# Patient Record
Sex: Male | Born: 1955 | Race: Black or African American | Hispanic: No | Marital: Married | Smoking: Former smoker
Health system: Southern US, Community
[De-identification: ages and names within clinical notes are randomized; demographics above are authoritative.]

## PROBLEM LIST (undated history)

## (undated) DIAGNOSIS — E669 Obesity, unspecified: Secondary | ICD-10-CM

## (undated) DIAGNOSIS — E119 Type 2 diabetes mellitus without complications: Secondary | ICD-10-CM

## (undated) DIAGNOSIS — E785 Hyperlipidemia, unspecified: Secondary | ICD-10-CM

## (undated) DIAGNOSIS — I10 Essential (primary) hypertension: Secondary | ICD-10-CM

## (undated) DIAGNOSIS — T7840XA Allergy, unspecified, initial encounter: Secondary | ICD-10-CM

## (undated) HISTORY — DX: Hyperlipidemia, unspecified: E78.5

## (undated) HISTORY — PX: ABDOMINAL SURGERY: SHX537

## (undated) HISTORY — DX: Allergy, unspecified, initial encounter: T78.40XA

## (undated) HISTORY — DX: Obesity, unspecified: E66.9

## (undated) HISTORY — DX: Essential (primary) hypertension: I10

---

## 2004-07-28 ENCOUNTER — Emergency Department: Payer: Self-pay | Admitting: Internal Medicine

## 2009-09-30 ENCOUNTER — Emergency Department: Payer: Self-pay | Admitting: Emergency Medicine

## 2014-02-11 ENCOUNTER — Ambulatory Visit: Payer: Self-pay | Admitting: Family Medicine

## 2014-08-26 ENCOUNTER — Encounter: Payer: Self-pay | Admitting: Family Medicine

## 2014-08-26 ENCOUNTER — Ambulatory Visit (INDEPENDENT_AMBULATORY_CARE_PROVIDER_SITE_OTHER): Payer: BLUE CROSS/BLUE SHIELD | Admitting: Family Medicine

## 2014-08-26 VITALS — BP 118/80 | HR 82 | Temp 98.2°F | Resp 16 | Ht 68.5 in | Wt 220.0 lb

## 2014-08-26 DIAGNOSIS — S39011A Strain of muscle, fascia and tendon of abdomen, initial encounter: Secondary | ICD-10-CM

## 2014-08-26 DIAGNOSIS — E119 Type 2 diabetes mellitus without complications: Secondary | ICD-10-CM | POA: Insufficient documentation

## 2014-08-26 DIAGNOSIS — E669 Obesity, unspecified: Secondary | ICD-10-CM | POA: Insufficient documentation

## 2014-08-26 DIAGNOSIS — E785 Hyperlipidemia, unspecified: Secondary | ICD-10-CM | POA: Insufficient documentation

## 2014-08-26 DIAGNOSIS — R7303 Prediabetes: Secondary | ICD-10-CM | POA: Insufficient documentation

## 2014-08-26 DIAGNOSIS — S76219A Strain of adductor muscle, fascia and tendon of unspecified thigh, initial encounter: Secondary | ICD-10-CM

## 2014-08-26 DIAGNOSIS — B070 Plantar wart: Secondary | ICD-10-CM | POA: Insufficient documentation

## 2014-08-26 DIAGNOSIS — E782 Mixed hyperlipidemia: Secondary | ICD-10-CM | POA: Insufficient documentation

## 2014-08-26 DIAGNOSIS — E668 Other obesity: Secondary | ICD-10-CM | POA: Insufficient documentation

## 2014-08-26 DIAGNOSIS — I10 Essential (primary) hypertension: Secondary | ICD-10-CM | POA: Insufficient documentation

## 2014-08-26 DIAGNOSIS — E1169 Type 2 diabetes mellitus with other specified complication: Secondary | ICD-10-CM | POA: Insufficient documentation

## 2014-08-26 DIAGNOSIS — R768 Other specified abnormal immunological findings in serum: Secondary | ICD-10-CM | POA: Insufficient documentation

## 2014-08-26 DIAGNOSIS — M25569 Pain in unspecified knee: Secondary | ICD-10-CM | POA: Insufficient documentation

## 2014-08-26 DIAGNOSIS — R748 Abnormal levels of other serum enzymes: Secondary | ICD-10-CM | POA: Insufficient documentation

## 2014-08-26 MED ORDER — OXYCODONE-ACETAMINOPHEN 5-325 MG PO TABS
1.0000 | ORAL_TABLET | Freq: Four times a day (QID) | ORAL | Status: DC | PRN
Start: 2014-08-26 — End: 2015-02-24

## 2014-08-26 MED ORDER — CYCLOBENZAPRINE HCL 5 MG PO TABS: 5.0000 mg | ORAL_TABLET | Freq: Every day | ORAL | Status: DC

## 2014-08-26 NOTE — Patient Instructions (Signed)
Groin Strain °A groin strain (also called a groin pull) is an injury to the muscles or tendon on the upper inner part of the thigh. These muscles are called the adductor muscles or groin muscles. They are responsible for moving the leg across the body. A muscle strain occurs when a muscle is overstretched and some muscle fibers are torn. A groin strain can range from mild to severe depending on how many muscle fibers are affected and whether the muscle fibers are partially or completely torn.  °Groin strains usually occur during exercise or participation in sports. The injury often happens when a sudden, violent force is placed on a muscle, stretching the muscle too far. A strain is more likely to occur when your muscles are not warmed up or if you are not properly conditioned. Depending on the severity of the groin strain, recovery time may vary from a few weeks to several weeks. Severe injuries often require 4-6 weeks for recovery. In these cases, complete healing can take 4-5 months.  °CAUSES  °· Stretching the groin muscles too far or too suddenly, often during side-to-side motion with an abrupt change in direction. °· Putting repeated stress on the groin muscles over a long period of time. °· Performing vigorous activity without properly stretching the groin muscles beforehand. °SYMPTOMS  °· Pain and tenderness in the groin area. This begins as sharp pain and persists as a dull ache. °· Popping or snapping feeling when the injury occurs (for severe strains). °· Swelling or bruising. °· Muscle spasms. °· Weakness in the leg. °· Stiffness in the groin area with decreased ability to move the affected muscles. °DIAGNOSIS  °Your caregiver will perform a physical exam to diagnose a groin strain. You will be asked about your symptoms and how the injury occurred. X-rays are sometimes needed to rule out a broken bone or cartilage problems. Your caregiver may order a CT scan or MRI if a complete muscle tear is  suspected. °TREATMENT  °A groin strain will often heal on its own. Your caregiver may prescribe medicines to help manage pain and swelling (anti-inflammatory medicine). You may be told to use crutches for the first few days to minimize your pain. °HOME CARE INSTRUCTIONS  °· Rest. Do not use the strained muscle if it causes pain. °· Put ice on the injured area. °¨ Put ice in a plastic bag. °¨ Place a towel between your skin and the bag. °¨ Leave the ice on for 15-20 minutes, every 2-3 hours. Do this for the first 2 days after the injury.  °· Only take over-the-counter or prescription medicines as directed by your caregiver. °· Wrap the injured area with an elastic bandage as directed by your caregiver. °· Keep the injured leg raised (elevated). °· Walk, stretch, and perform range-of-motion exercises to improve blood flow to the injured area. Only perform these activities if you can do so without any pain. °To prevent muscle strains: °· Warm up before exercise. °· Develop proper conditioning and strength in the groin muscles. °SEEK IMMEDIATE MEDICAL CARE IF:  °· You have increased pain or swelling in the affected area.   °· Your symptoms are not improving or are getting worse. °MAKE SURE YOU:  °· Understand these instructions. °· Will watch your condition. °· Will get help right away if you are not doing well or get worse. °Document Released: 11/09/2003 Document Revised: 02/28/2012 Document Reviewed: 11/15/2011 °[employer]® Patient Information ©2015 [employer]. This information is not intended to replace advice given to you   by your health care provider. Make sure you discuss any questions you have with your health care provider. ° °

## 2014-08-26 NOTE — Progress Notes (Signed)
Name: Paul Ingram   MRN: [id number]    DOB: [birth year]   Date:08/26/2014       Progress Note  Subjective  Chief Complaint  Chief Complaint  Patient presents with  . Leg Pain    Right hip area w/o known injury.    HPI  The patient is here today to discuss their joint pain.  Is this a chronic problem or an acute problem? Acute When did the pain start? Yesterday Where is the pain located? Right hip How would you describe the pain? (constant, intermittent, sharp, piercing, throbbing, dull ache) Constant Does the pain radiate beyond place of origin? No During a typical day what is the pattern of the pain? constant What makes the pain better? Sitting What makes the pain worse? General activity Is patient able to function at home, work, school? Went to work yesterday. Is it getting better, worse or staying the same? Staying about the same What medications have you tried to help manage your pain?  Ibuprofen  Which medications work reasonably well to help manage your pain? Ibuprofen did not help much. Have you have X-rays, MRI, CT Scan related to the area of pain? No Have you ever participated in Physical Therapy for this pain? no Have you ever been evaluated by an Orthopedic or Pain Specialist Doctor? no If yes please list name of doctor or clinic:   Patient Active Problem List   Diagnosis Date Noted  . Abnormal liver enzymes 08/26/2014  . 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 Elevated cholesterol with elevated triglycerides 08/26/2014  . BP (high blood pressure) 08/26/2014  . Gonalgia 08/26/2014  . Extreme obesity 08/26/2014  . Plantar verruca 08/26/2014  . Borderline diabetes 08/26/2014  . Groin strain 08/26/2014    History  Substance Use Topics  . Smoking status: Current Some Day Smoker  . Smokeless tobacco: Not on file     Comment: patient stated he doesn't smoke enough to quit  . Alcohol Use: 0.0 oz/week    0 Standard drinks or equivalent per week   Comment: during special events and games     Current outpatient prescriptions:  .  ibuprofen (ADVIL,MOTRIN) 800 MG tablet, Take by mouth., Disp: , Rfl:  .  lisinopril-hydrochlorothiazide (PRINZIDE,ZESTORETIC) 10-12.5 MG per tablet, Take by mouth., Disp: , Rfl:  .  cyclobenzaprine (FLEXERIL) 5 MG tablet, Take 1 tablet (5 mg total) by mouth at bedtime., Disp: 30 tablet, Rfl: 1 .  oxyCODONE-acetaminophen (ROXICET) 5-325 MG per tablet, Take 1 tablet by mouth every 6 (six) hours as needed for severe pain., Disp: 30 tablet, Rfl: 0  Allergies  Allergen Reactions  . Shellfish Allergy Anaphylaxis    Review of Systems  Ten systems reviewed and is negative except as mentioned in HPI. No fever, chills, numbness.   Objective  BP 118/80 mmHg  Pulse 82  Temp(Src) 98.2 F (36.8 C) (Oral)  Resp 16  Ht 5' 8.5" (1.74 m)  Wt 220 lb (99.791 kg)  BMI 32.96 kg/m2  SpO2 98%  Physical Exam  Constitutional: Patient appears well-developed and well-nourished. In no distress.  Cardiovascular: Normal rate, regular rhythm and normal heart sounds.  No murmur heard.  Pulmonary/Chest: Effort normal and breath sounds normal. No respiratory distress. Musculoskeletal: Normal range of motion bilateral UE and LE, no joint effusions. No leg length discrepancy after resetting hips bilaterally. Right hip flexion, extension, external rotation and internal rotation uncomfortable for patient during passive motion but ROM intact.  Peripheral vascular: Bilateral LE no edema. Neurological:  CN II-XII grossly intact with no focal deficits. Alert and oriented to person, place, and time. Coordination, balance, strength, speech and gait are normal.  Skin: Skin is warm and dry. No rash noted. No erythema.  Psychiatric: Patient has a normal mood and affect. Behavior is normal in office today. Judgment and thought content normal in office today.    Assessment & Plan  1. Groin strain, initial encounter Likely soft tissue  strain of right groin. Instructed on exercises and conservative management at home. F/U if no improvement, will get imaging at that time if appropriate.   - oxyCODONE-acetaminophen (ROXICET) 5-325 MG per tablet; Take 1 tablet by mouth every 6 (six) hours as needed for severe pain.  Dispense: 30 tablet; Refill: 0 - cyclobenzaprine (FLEXERIL) 5 MG tablet; Take 1 tablet (5 mg total) by mouth at bedtime.  Dispense: 30 tablet; Refill: 1

## 2014-10-28 ENCOUNTER — Other Ambulatory Visit: Payer: Self-pay | Admitting: Family Medicine

## 2015-02-15 ENCOUNTER — Encounter: Payer: Self-pay | Admitting: Family Medicine

## 2015-02-24 ENCOUNTER — Encounter: Payer: Self-pay | Admitting: Family Medicine

## 2015-02-24 ENCOUNTER — Ambulatory Visit (INDEPENDENT_AMBULATORY_CARE_PROVIDER_SITE_OTHER): Payer: BLUE CROSS/BLUE SHIELD | Admitting: Family Medicine

## 2015-02-24 VITALS — BP 132/86 | HR 78 | Temp 98.2°F | Resp 12 | Wt 216.5 lb

## 2015-02-24 DIAGNOSIS — Z23 Encounter for immunization: Secondary | ICD-10-CM | POA: Insufficient documentation

## 2015-02-24 DIAGNOSIS — L309 Dermatitis, unspecified: Secondary | ICD-10-CM | POA: Diagnosis not present

## 2015-02-24 DIAGNOSIS — R894 Abnormal immunological findings in specimens from other organs, systems and tissues: Secondary | ICD-10-CM | POA: Diagnosis not present

## 2015-02-24 DIAGNOSIS — E782 Mixed hyperlipidemia: Secondary | ICD-10-CM

## 2015-02-24 DIAGNOSIS — Z0000 Encounter for general adult medical examination without abnormal findings: Secondary | ICD-10-CM | POA: Diagnosis not present

## 2015-02-24 DIAGNOSIS — R7303 Prediabetes: Secondary | ICD-10-CM

## 2015-02-24 DIAGNOSIS — I10 Essential (primary) hypertension: Secondary | ICD-10-CM

## 2015-02-24 DIAGNOSIS — Z125 Encounter for screening for malignant neoplasm of prostate: Secondary | ICD-10-CM | POA: Insufficient documentation

## 2015-02-24 DIAGNOSIS — R768 Other specified abnormal immunological findings in serum: Secondary | ICD-10-CM

## 2015-02-24 MED ORDER — HYDROCORTISONE VALERATE 0.2 % EX CREA: 1.0000 "application " | TOPICAL_CREAM | Freq: Two times a day (BID) | CUTANEOUS | Status: DC

## 2015-02-24 MED ORDER — LISINOPRIL-HYDROCHLOROTHIAZIDE 10-12.5 MG PO TABS: 1.0000 | ORAL_TABLET | Freq: Every day | ORAL | Status: DC
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
## 2015-04-28 ENCOUNTER — Ambulatory Visit
Admission: RE | Admit: 2015-04-28 | Discharge: 2015-04-28 | Disposition: A | Discharge status: Home / Self Care | Payer: BLUE CROSS/BLUE SHIELD | Source: Ambulatory Visit | Attending: Family Medicine | Admitting: Family Medicine

## 2015-04-28 ENCOUNTER — Encounter: Payer: Self-pay | Admitting: Family Medicine

## 2015-04-28 ENCOUNTER — Ambulatory Visit (INDEPENDENT_AMBULATORY_CARE_PROVIDER_SITE_OTHER): Payer: BLUE CROSS/BLUE SHIELD | Admitting: Family Medicine

## 2015-04-28 VITALS — BP 122/84 | HR 84 | Temp 99.0°F | Resp 16 | Ht 69.0 in | Wt 218.1 lb

## 2015-04-28 DIAGNOSIS — K5900 Constipation, unspecified: Secondary | ICD-10-CM | POA: Insufficient documentation

## 2015-04-28 DIAGNOSIS — R109 Unspecified abdominal pain: Secondary | ICD-10-CM | POA: Insufficient documentation

## 2015-04-28 DIAGNOSIS — K625 Hemorrhage of anus and rectum: Secondary | ICD-10-CM | POA: Diagnosis not present

## 2015-04-28 DIAGNOSIS — K921 Melena: Secondary | ICD-10-CM | POA: Diagnosis not present

## 2015-04-28 DIAGNOSIS — Z1211 Encounter for screening for malignant neoplasm of colon: Secondary | ICD-10-CM | POA: Insufficient documentation

## 2015-04-28 LAB — POC HEMOCCULT BLD/STL (OFFICE/1-CARD/DIAGNOSTIC): Fecal Occult Blood, POC: NEGATIVE

## 2015-04-28 MED ORDER — HYDROCORTISONE ACETATE 25 MG RE SUPP: 25.0000 mg | Freq: Two times a day (BID) | RECTAL | Status: DC | PRN
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
## 2015-04-28 NOTE — Patient Instructions (Signed)
Start miralax every day, adjust dose to achieve soft bowel movement every day or every other day.

## 2015-05-09 ENCOUNTER — Telehealth: Payer: Self-pay | Admitting: Gastroenterology

## 2015-05-09 NOTE — Telephone Encounter (Signed)
Gastroenterology Pre-Procedure Review  Request Date: 06-15-2015 Requesting Physician: Dr.  PATIENT REVIEW QUESTIONS: The patient responded to the following health history questions as indicated:    1. Are you having any GI issues? no Sometimes patient has rectal bleeding but ok right now 2. Do you have a personal history of Polyps? no 3. Do you have a family history of Colon Cancer or Polyps? no 4. Diabetes Mellitus? no 5. Joint replacements in the past 12 months?no  6. Major health problems in the past 3 months?no 7. Any artificial heart valves, MVP, or defibrillator?no    MEDICATIONS & ALLERGIES:    Patient reports the following regarding taking any anticoagulation/antiplatelet therapy:   Plavix, Coumadin, Eliquis, Xarelto, Lovenox, Pradaxa, Brilinta, or Effient? no Aspirin? no  Patient confirms/reports the following medications:  Current Outpatient Prescriptions  Medication Sig Dispense Refill   hydrocortisone (ANUSOL-HC) 25 MG suppository Place 1 suppository (25 mg total) rectally 2 (two) times daily as needed for hemorrhoids or itching. 12 suppository 1   hydrocortisone valerate cream (WESTCORT) 0.2 % Apply 1 application topically 2 (two) times daily. 60 g 1   lisinopril-hydrochlorothiazide (PRINZIDE,ZESTORETIC) 10-12.5 MG tablet Take 1 tablet by mouth daily. 90 tablet 2   No current facility-administered medications for this visit.    Patient confirms/reports the following allergies:  Allergies  Allergen Reactions   Shellfish Allergy Anaphylaxis    No orders of the defined types were placed in this encounter.    AUTHORIZATION INFORMATION Primary Insurance: 1D#: Group #:  Secondary Insurance: 1D#: Group #:  SCHEDULE INFORMATION: Date: 06-15-2015 Time: Location:[facility]

## 2015-05-10 ENCOUNTER — Other Ambulatory Visit: Payer: Self-pay

## 2015-06-15 ENCOUNTER — Encounter: Payer: Self-pay | Admitting: *Deleted

## 2015-06-15 ENCOUNTER — Encounter
Admission: RE | Disposition: A | Discharge status: Home / Self Care | Payer: Self-pay | Source: Ambulatory Visit | Attending: Gastroenterology

## 2015-06-15 ENCOUNTER — Ambulatory Visit: Payer: BLUE CROSS/BLUE SHIELD | Admitting: Anesthesiology

## 2015-06-15 ENCOUNTER — Ambulatory Visit
Admission: RE | Admit: 2015-06-15 | Discharge: 2015-06-15 | Disposition: A | Discharge status: Home / Self Care | Payer: BLUE CROSS/BLUE SHIELD | Source: Ambulatory Visit | Attending: Gastroenterology | Admitting: Gastroenterology

## 2015-06-15 DIAGNOSIS — I10 Essential (primary) hypertension: Secondary | ICD-10-CM | POA: Insufficient documentation

## 2015-06-15 DIAGNOSIS — Z79899 Other long term (current) drug therapy: Secondary | ICD-10-CM | POA: Insufficient documentation

## 2015-06-15 DIAGNOSIS — Z1211 Encounter for screening for malignant neoplasm of colon: Secondary | ICD-10-CM | POA: Diagnosis not present

## 2015-06-15 DIAGNOSIS — Z809 Family history of malignant neoplasm, unspecified: Secondary | ICD-10-CM | POA: Diagnosis not present

## 2015-06-15 DIAGNOSIS — K640 First degree hemorrhoids: Secondary | ICD-10-CM | POA: Insufficient documentation

## 2015-06-15 DIAGNOSIS — D125 Benign neoplasm of sigmoid colon: Secondary | ICD-10-CM | POA: Diagnosis not present

## 2015-06-15 DIAGNOSIS — Z8042 Family history of malignant neoplasm of prostate: Secondary | ICD-10-CM | POA: Diagnosis not present

## 2015-06-15 DIAGNOSIS — D123 Benign neoplasm of transverse colon: Secondary | ICD-10-CM | POA: Insufficient documentation

## 2015-06-15 DIAGNOSIS — Z91013 Allergy to seafood: Secondary | ICD-10-CM | POA: Insufficient documentation

## 2015-06-15 DIAGNOSIS — K5730 Diverticulosis of large intestine without perforation or abscess without bleeding: Secondary | ICD-10-CM | POA: Insufficient documentation

## 2015-06-15 DIAGNOSIS — E785 Hyperlipidemia, unspecified: Secondary | ICD-10-CM | POA: Insufficient documentation

## 2015-06-15 DIAGNOSIS — F17210 Nicotine dependence, cigarettes, uncomplicated: Secondary | ICD-10-CM | POA: Diagnosis not present

## 2015-06-15 HISTORY — PX: COLONOSCOPY WITH PROPOFOL: SHX5780

## 2015-06-15 SURGERY — COLONOSCOPY WITH PROPOFOL
Anesthesia: General

## 2015-06-15 MED ORDER — PROPOFOL 500 MG/50ML IV EMUL
INTRAVENOUS | Status: DC | PRN
  Administered 2015-06-15: 50 ug/kg/min via INTRAVENOUS

## 2015-06-15 MED ORDER — SODIUM CHLORIDE 0.9 % IV SOLN
INTRAVENOUS | Status: DC
  Administered 2015-06-15 (×2): via INTRAVENOUS
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
## 2015-06-15 NOTE — Transfer of Care (Signed)
Immediate Anesthesia Transfer of Care Note  Patient: Paul Ingram  Procedure(s) Performed: Procedure(s): COLONOSCOPY WITH PROPOFOL (N/A)  Patient Location: PACU  Anesthesia Type:General  Level of Consciousness: awake, alert  and oriented  Airway & Oxygen Therapy: Patient Spontanous Breathing and Patient connected to nasal cannula oxygen  Post-op Assessment: Report given to RN and Post -op Vital signs reviewed and stable  Post vital signs: Reviewed and stable  Last Vitals:  Filed Vitals:   06/15/15 0926 06/15/15 1115  BP: 119/83 109/76  Pulse: 76 72  Temp: 37.1 C   Resp: 18 12    Complications: No apparent anesthesia complications
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
## 2015-06-15 NOTE — Op Note (Signed)
[facility] Patient Name: Paul Ingram Procedure Date: 06/15/2015 10:46 AM MRN: [id number] Account #: [id number] Date of Birth: December 12, 1955 Admit Type: Outpatient Age: 60 Room: [facility] ENDO ROOM 4 Gender: Male Note Status: Finalized Procedure:            Colonoscopy Indications:          Screening for colorectal malignant neoplasm Providers:            Lucilla Lame, MD Referring MD:         Bobetta Lime (Referring MD) Medicines:            Propofol per Anesthesia Complications:        No immediate complications. Procedure:            Pre-Anesthesia Assessment:                       - Prior to the procedure, a History and Physical was                        performed, and patient medications and allergies were                        reviewed. The patient's tolerance of previous                        anesthesia was also reviewed. The risks and benefits of                        the procedure and the sedation options and risks were                        discussed with the patient. All questions were                        answered, and informed consent was obtained. Prior                        Anticoagulants: The patient has taken no previous                        anticoagulant or antiplatelet agents. ASA Grade                        Assessment: II - A patient with mild systemic disease.                        After reviewing the risks and benefits, the patient was                        deemed in satisfactory condition to undergo the                        procedure.                       After obtaining informed consent, the colonoscope was                        passed under direct vision. Throughout the procedure,  the patient's blood pressure, pulse, and oxygen                        saturations were monitored continuously. The                        Colonoscope was introduced through the anus and   advanced to the the cecum, identified by appendiceal                        orifice and ileocecal valve. The colonoscopy was                        performed without difficulty. The patient tolerated the                        procedure well. The quality of the bowel preparation                        was excellent. Findings:      The perianal and digital rectal examinations were normal.      Two sessile polyps were found in the sigmoid colon. The polyps were 3 to       6 mm in size. These polyps were removed with a cold snare. Resection and       retrieval were complete.      Three sessile polyps were found in the transverse colon. The polyps were       4 to 7 mm in size. These polyps were removed with a cold snare.       Resection and retrieval were complete.      Multiple small-mouthed diverticula were found in the sigmoid colon.      Non-bleeding internal hemorrhoids were found during retroflexion. The       hemorrhoids were Grade I (internal hemorrhoids that do not prolapse). Impression:           - Two 3 to 6 mm polyps in the sigmoid colon, removed                        with a cold snare. Resected and retrieved.                       - Three 4 to 7 mm polyps in the transverse colon,                        removed with a cold snare. Resected and retrieved.                       - Diverticulosis in the sigmoid colon.                       - Non-bleeding internal hemorrhoids. Recommendation:       - Repeat colonoscopy in 5 years if polyp adenoma and 10                        years if hyperplastic Procedure Code(s):    --- Professional ---                       [phone], Colonoscopy, flexible; with removal of tumor(s),  polyp(s), or other lesion(s) by snare technique Diagnosis Code(s):    --- Professional ---                       Z12.11, Encounter for screening for malignant neoplasm                        of colon                       D12.5, Benign neoplasm of  sigmoid colon                       D12.3, Benign neoplasm of transverse colon (hepatic                        flexure or splenic flexure) CPT copyright 2016 American Medical Association. All rights reserved. The codes documented in this report are preliminary and upon coder review may  be revised to meet current compliance requirements. Lucilla Lame, MD 06/15/2015 11:10:08 AM This report has been signed electronically. Number of Addenda: 0 Note Initiated On: 06/15/2015 10:46 AM Scope Withdrawal Time: 0 hours 10 minutes 12 seconds  Total Procedure Duration: 0 hours 17 minutes 7 seconds       [facility]

## 2015-06-15 NOTE — Anesthesia Postprocedure Evaluation (Signed)
Anesthesia Post Note  Patient: Paul Ingram  Procedure(s) Performed: Procedure(s) (LRB): COLONOSCOPY WITH PROPOFOL (N/A)  Patient location during evaluation: PACU Anesthesia Type: General Level of consciousness: awake and alert Pain management: pain level controlled Vital Signs Assessment: post-procedure vital signs reviewed and stable Respiratory status: spontaneous breathing, nonlabored ventilation, respiratory function stable and patient connected to nasal cannula oxygen Cardiovascular status: blood pressure returned to baseline and stable Postop Assessment: no signs of nausea or vomiting Anesthetic complications: no    Last Vitals:  Filed Vitals:   06/15/15 0926 06/15/15 1115  BP: 119/83 109/76  Pulse: 76 72  Temp: 37.1 C   Resp: 18 12    Last Pain: There were no vitals filed for this visit.               [name]

## 2015-06-16 ENCOUNTER — Encounter: Payer: Self-pay | Admitting: Gastroenterology

## 2015-06-16 LAB — SURGICAL PATHOLOGY

## 2015-06-17 ENCOUNTER — Encounter: Payer: Self-pay | Admitting: Gastroenterology

## 2015-09-02 ENCOUNTER — Encounter: Payer: Self-pay | Admitting: Family Medicine

## 2015-09-02 ENCOUNTER — Ambulatory Visit (INDEPENDENT_AMBULATORY_CARE_PROVIDER_SITE_OTHER): Payer: BLUE CROSS/BLUE SHIELD | Admitting: Family Medicine

## 2015-09-02 VITALS — BP 126/82 | HR 70 | Temp 98.8°F | Resp 18 | Wt 214.2 lb

## 2015-09-02 DIAGNOSIS — E782 Mixed hyperlipidemia: Secondary | ICD-10-CM

## 2015-09-02 DIAGNOSIS — Z5181 Encounter for therapeutic drug level monitoring: Secondary | ICD-10-CM

## 2015-09-02 DIAGNOSIS — R252 Cramp and spasm: Secondary | ICD-10-CM | POA: Insufficient documentation

## 2015-09-02 DIAGNOSIS — R7303 Prediabetes: Secondary | ICD-10-CM

## 2015-09-02 DIAGNOSIS — E669 Obesity, unspecified: Secondary | ICD-10-CM

## 2015-09-02 DIAGNOSIS — J0100 Acute maxillary sinusitis, unspecified: Secondary | ICD-10-CM | POA: Diagnosis not present

## 2015-09-02 MED ORDER — AMOXICILLIN-POT CLAVULANATE 875-125 MG PO TABS
1.0000 | ORAL_TABLET | Freq: Two times a day (BID) | ORAL | Status: AC
Start: 2015-09-02 — End: 2015-09-12

## 2015-09-02 NOTE — Assessment & Plan Note (Signed)
Check labs 
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
## 2015-09-02 NOTE — Patient Instructions (Addendum)
Please start the antibiotics Try vitamin C (orange juice if not diabetic or vitamin C tablets) and drink green tea to help your immune system during your illness Get plenty of rest and hydration Please do eat yogurt daily or take a probiotic daily for the next month or two We want to replace the healthy germs in the gut If you notice foul, watery diarrhea in the next two months, schedule an appointment RIGHT AWAY We'll get labs today and contact you about the results  Your goal blood pressure is less than 140 mmHg on top. Try to follow the DASH guidelines (DASH stands for Dietary Approaches to Stop Hypertension) Try to limit the sodium in your diet.  Ideally, consume less than 1.5 grams (less than 1,500mg ) per day. Do not add salt when cooking or at the table.  Check the sodium amount on labels when shopping, and choose items lower in sodium when given a choice. Avoid or limit foods that already contain a lot of sodium. Eat a diet rich in fruits and vegetables and whole grains.  DASH Eating Plan DASH stands for "Dietary Approaches to Stop Hypertension." The DASH eating plan is a healthy eating plan that has been shown to reduce high blood pressure (hypertension). Additional health benefits may include reducing the risk of type 2 diabetes mellitus, heart disease, and stroke. The DASH eating plan may also help with weight loss. WHAT DO I NEED TO KNOW ABOUT THE DASH EATING PLAN? For the DASH eating plan, you will follow these general guidelines:  Choose foods with a percent daily value for sodium of less than 5% (as listed on the food label).  Use salt-free seasonings or herbs instead of table salt or sea salt.  Check with your health care provider or pharmacist before using salt substitutes.  Eat lower-sodium products, often labeled as "lower sodium" or "no salt added."  Eat fresh foods.  Eat more vegetables, fruits, and low-fat dairy products.  Choose whole grains. Look for the word  "whole" as the first word in the ingredient list.  Choose fish and skinless chicken or [place] more often than red meat. Limit fish, poultry, and meat to 6 oz (170 g) each day.  Limit sweets, desserts, sugars, and sugary drinks.  Choose heart-healthy fats.  Limit cheese to 1 oz (28 g) per day.  Eat more home-cooked food and less restaurant, buffet, and fast food.  Limit fried foods.  Cook foods using methods other than frying.  Limit canned vegetables. If you do use them, rinse them well to decrease the sodium.  When eating at a restaurant, ask that your food be prepared with less salt, or no salt if possible. WHAT FOODS CAN I EAT? Seek help from a dietitian for individual calorie needs. Grains Whole grain or whole wheat bread. Brown rice. Whole grain or whole wheat pasta. Quinoa, bulgur, and whole grain cereals. Low-sodium cereals. Corn or whole wheat flour tortillas. Whole grain cornbread. Whole grain crackers. Low-sodium crackers. Vegetables Fresh or frozen vegetables (raw, steamed, roasted, or grilled). Low-sodium or reduced-sodium tomato and vegetable juices. Low-sodium or reduced-sodium tomato sauce and paste. Low-sodium or reduced-sodium canned vegetables.  Fruits All fresh, canned (in natural juice), or frozen fruits. Meat and Other Protein Products Ground beef (85% or leaner), grass-fed beef, or beef trimmed of fat. Skinless chicken or [place]. Ground chicken or [place]. Pork trimmed of fat. All fish and seafood. Eggs. Dried beans, peas, or lentils. Unsalted nuts and seeds. Unsalted canned beans. Dairy Low-fat dairy  products, such as skim or 1% milk, 2% or reduced-fat cheeses, low-fat ricotta or cottage cheese, or plain low-fat yogurt. Low-sodium or reduced-sodium cheeses. Fats and Oils Tub margarines without trans fats. Light or reduced-fat mayonnaise and salad dressings (reduced sodium). Avocado. Safflower, olive, or canola oils. Natural peanut or almond  butter. Other Unsalted popcorn and pretzels. The items listed above may not be a complete list of recommended foods or beverages. Contact your dietitian for more options. WHAT FOODS ARE NOT RECOMMENDED? Grains White bread. White pasta. White rice. Refined cornbread. Bagels and croissants. Crackers that contain trans fat. Vegetables Creamed or fried vegetables. Vegetables in a cheese sauce. Regular canned vegetables. Regular canned tomato sauce and paste. Regular tomato and vegetable juices. Fruits Dried fruits. Canned fruit in light or heavy syrup. Fruit juice. Meat and Other Protein Products Fatty cuts of meat. Ribs, chicken wings, bacon, sausage, bologna, salami, chitterlings, fatback, hot dogs, bratwurst, and packaged luncheon meats. Salted nuts and seeds. Canned beans with salt. Dairy Whole or 2% milk, cream, half-and-half, and cream cheese. Whole-fat or sweetened yogurt. Full-fat cheeses or blue cheese. Nondairy creamers and whipped toppings. Processed cheese, cheese spreads, or cheese curds. Condiments Onion and garlic salt, seasoned salt, table salt, and sea salt. Canned and packaged gravies. Worcestershire sauce. Tartar sauce. Barbecue sauce. Teriyaki sauce. Soy sauce, including reduced sodium. Steak sauce. Fish sauce. Oyster sauce. Cocktail sauce. Horseradish. Ketchup and mustard. Meat flavorings and tenderizers. Bouillon cubes. Hot sauce. Tabasco sauce. Marinades. Taco seasonings. Relishes. Fats and Oils Butter, stick margarine, lard, shortening, ghee, and bacon fat. Coconut, palm kernel, or palm oils. Regular salad dressings. Other Pickles and olives. Salted popcorn and pretzels. The items listed above may not be a complete list of foods and beverages to avoid. Contact your dietitian for more information. WHERE CAN I FIND MORE INFORMATION? National Heart, Lung, and Blood Institute: travelstabloid.com   This information is not intended to replace  advice given to you by your health care provider. Make sure you discuss any questions you have with your health care provider.   Document Released: 03/02/2011 Document Revised: 04/03/2014 Document Reviewed: 01/15/2013 Elsevier Interactive Patient Education [employer].

## 2015-09-02 NOTE — Assessment & Plan Note (Addendum)
Check lipids; patient well overdue for labs; health eating, diet low in saturated fats

## 2015-09-19 ENCOUNTER — Encounter: Payer: Self-pay | Admitting: Family Medicine

## 2015-09-19 DIAGNOSIS — E669 Obesity, unspecified: Secondary | ICD-10-CM

## 2015-09-19 DIAGNOSIS — Z6834 Body mass index (BMI) 34.0-34.9, adult: Secondary | ICD-10-CM | POA: Insufficient documentation

## 2015-09-19 DIAGNOSIS — E6609 Other obesity due to excess calories: Secondary | ICD-10-CM | POA: Insufficient documentation

## 2015-09-19 HISTORY — DX: Obesity, unspecified: E66.9

## 2015-09-19 NOTE — Assessment & Plan Note (Signed)
Check labs to monitor glucose and lipids; weight loss encouraged

## 2015-12-23 ENCOUNTER — Encounter: Payer: Self-pay | Admitting: Family Medicine

## 2015-12-23 ENCOUNTER — Ambulatory Visit
Admission: RE | Admit: 2015-12-23 | Discharge: 2015-12-23 | Disposition: A | Discharge status: Home / Self Care | Payer: BLUE CROSS/BLUE SHIELD | Source: Ambulatory Visit | Attending: Family Medicine | Admitting: Family Medicine

## 2015-12-23 ENCOUNTER — Ambulatory Visit (INDEPENDENT_AMBULATORY_CARE_PROVIDER_SITE_OTHER): Payer: BLUE CROSS/BLUE SHIELD | Admitting: Family Medicine

## 2015-12-23 VITALS — BP 122/80 | HR 83 | Temp 98.0°F | Resp 17 | Ht 68.0 in | Wt 222.1 lb

## 2015-12-23 DIAGNOSIS — M25561 Pain in right knee: Secondary | ICD-10-CM | POA: Insufficient documentation

## 2015-12-23 DIAGNOSIS — M11261 Other chondrocalcinosis, right knee: Secondary | ICD-10-CM | POA: Insufficient documentation

## 2015-12-23 LAB — COMPLETE METABOLIC PANEL WITH GFR
ALT: 34 U/L (ref 9–46)
AST: 30 U/L (ref 10–35)
Albumin: 3.9 g/dL (ref 3.6–5.1)
Alkaline Phosphatase: 57 U/L (ref 40–115)
BUN: 10 mg/dL (ref 7–25)
CO2: 26 mmol/L (ref 20–31)
Calcium: 9.4 mg/dL (ref 8.6–10.3)
Chloride: 108 mmol/L (ref 98–110)
Creat: 0.88 mg/dL (ref 0.70–1.25)
GFR, Est African American: >89 mL/min (ref >=60)
GFR, Est Non African American: >89 mL/min (ref >=60)
Glucose, Bld: 97 mg/dL (ref 65–99)
Potassium: 4.2 mmol/L (ref 3.5–5.3)
Sodium: 140 mmol/L (ref 135–146)
Total Bilirubin: 0.7 mg/dL (ref 0.2–1.2)
Total Protein: 6.7 g/dL (ref 6.1–8.1)

## 2015-12-23 MED ORDER — MELOXICAM 7.5 MG PO TABS: 7.5000 mg | ORAL_TABLET | Freq: Every day | ORAL | 0 refills | Status: DC
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
## 2016-01-19 ENCOUNTER — Other Ambulatory Visit: Payer: Self-pay | Admitting: Family Medicine

## 2016-01-19 DIAGNOSIS — M25561 Pain in right knee: Secondary | ICD-10-CM

## 2016-05-15 ENCOUNTER — Encounter: Payer: Self-pay | Admitting: Family Medicine

## 2016-05-15 ENCOUNTER — Ambulatory Visit (INDEPENDENT_AMBULATORY_CARE_PROVIDER_SITE_OTHER): Payer: BLUE CROSS/BLUE SHIELD | Admitting: Family Medicine

## 2016-05-15 DIAGNOSIS — M25561 Pain in right knee: Secondary | ICD-10-CM | POA: Insufficient documentation

## 2016-05-15 MED ORDER — KNEE BRACE/HINGED BARS LARGE MISC: 0 refills | Status: DC

## 2016-05-15 MED ORDER — MELOXICAM 7.5 MG PO TABS: 7.5000 mg | ORAL_TABLET | Freq: Every day | ORAL | 0 refills | Status: DC
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
## 2016-05-15 NOTE — Patient Instructions (Signed)
Use the meloxicam if needed for pain Do not take any other NSAIDs (ibuprofen, etc.) Okay to take Tylenol per package directions up to 2,000 mg per day Ice topically 15-20 minutes at a time, 3-4 times a day We'll have you see the orthopaedist

## 2016-05-15 NOTE — Assessment & Plan Note (Signed)
Refer to ortho; brace prescribed; explained that I do not do injections; willing to write for limited activity (no prolonged standing, no bending, stooping, etc.) and job can come up with low activity assignments

## 2016-05-17 DIAGNOSIS — S8391XA Sprain of unspecified site of right knee, initial encounter: Secondary | ICD-10-CM | POA: Diagnosis not present

## 2016-05-17 DIAGNOSIS — M25561 Pain in right knee: Secondary | ICD-10-CM | POA: Diagnosis not present

## 2016-05-31 DIAGNOSIS — S8391XD Sprain of unspecified site of right knee, subsequent encounter: Secondary | ICD-10-CM | POA: Diagnosis not present

## 2016-06-08 DIAGNOSIS — M25561 Pain in right knee: Secondary | ICD-10-CM | POA: Diagnosis not present

## 2016-06-11 ENCOUNTER — Other Ambulatory Visit: Payer: Self-pay | Admitting: Family Medicine

## 2016-06-11 NOTE — Telephone Encounter (Signed)
Patient was referred to ortho for knee pain; I am declining this in case ortho has prescribed something similar; should only be getting NSAID from one doctor

## 2016-11-15 ENCOUNTER — Ambulatory Visit: Payer: BLUE CROSS/BLUE SHIELD | Admitting: Family Medicine

## 2017-01-30 ENCOUNTER — Ambulatory Visit: Payer: Self-pay | Admitting: *Deleted

## 2017-01-30 NOTE — Telephone Encounter (Signed)
Pt  Denies   specefic  Injury  However pain is   Worse  On movement    Reason for Disposition . [1] MODERATE pain (e.g., interferes with normal activities) AND [2] present > 3 days  Answer Assessment - Initial Assessment Questions 1. ONSET: "When did the pain start?"      1   Week   2. LOCATION: "Where is the pain located?"     rIGHT  SHOULDER 3. PAIN: "How bad is the pain?" (Scale 1-10; or mild, moderate, severe)   - MILD (1-3): doesn't interfere with normal activities   - MODERATE (4-7): interferes with normal activities (e.g., work or school) or awakens from sleep   - SEVERE (8-10): excruciating pain, unable to do any normal activities, unable to move arm at all due to pain     8 4. WORK OR EXERCISE: "Has there been any recent work or exercise that involved this part of the body?"    UNKNOWN 5. CAUSE: "What do you think is causing the shoulder pain?"      UNKNOWN 6. OTHER SYMPTOMS: "Do you have any other symptoms?" (e.g., neck pain, swelling, rash, fever, numbness, weakness)      NO 7. PREGNANCY: "Is there any chance you are pregnant?" "When was your last menstrual period?"     NO  Protocols used: SHOULDER PAIN-A-AH

## 2017-01-31 ENCOUNTER — Encounter: Payer: Self-pay | Admitting: Family Medicine

## 2017-01-31 ENCOUNTER — Ambulatory Visit: Payer: BLUE CROSS/BLUE SHIELD | Admitting: Family Medicine

## 2017-01-31 VITALS — BP 130/82 | HR 84 | Temp 98.3°F | Resp 18 | Ht 68.0 in | Wt 216.2 lb

## 2017-01-31 DIAGNOSIS — M25511 Pain in right shoulder: Secondary | ICD-10-CM | POA: Diagnosis not present

## 2017-01-31 MED ORDER — MELOXICAM 7.5 MG PO TABS
7.5000 mg | ORAL_TABLET | Freq: Every day | ORAL | 0 refills | Status: DC
Start: 2017-01-31 — End: 2018-01-23

## 2017-01-31 MED ORDER — TIZANIDINE HCL 2 MG PO CAPS: 2.0000 mg | ORAL_CAPSULE | Freq: Every evening | ORAL | 0 refills | Status: DC | PRN
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
## 2017-01-31 NOTE — Patient Instructions (Addendum)
Please take Meloxicam (Anti-Inflammatory Pain Medication) once daily AS NEEDED for your pain. Please take Tizanidine (Zanaflex - Muscle relaxer) once daily at night AS NEEDED for muscle spasm/pain.  RICE for Routine Care of Injuries Many injuries can be cared for using rest, ice, compression, and elevation (RICE therapy). Using RICE therapy can help to lessen pain and swelling. It can help your body to heal. Rest Reduce your normal activities and avoid using the injured part of your body. You can go back to your normal activities when you feel okay and your doctor says it is okay. Ice Do not put ice on your bare skin.  Put ice in a plastic bag.  Place a towel between your skin and the bag.  Leave the ice on for 20 minutes, 2-3 times a day.  Do this for as long as told by your doctor. Compression Compression means putting pressure on the injured area. This can be done with an elastic bandage. If an elastic bandage has been applied:  Remove and reapply the bandage every 3-4 hours or as told by your doctor.  Make sure the bandage is not wrapped too tight. Wrap the bandage more loosely if part of your body beyond the bandage is blue, swollen, cold, painful, or loses feeling (numb).  See your doctor if the bandage seems to make your problems worse.  Elevation Elevation means keeping the injured area raised. Raise the injured area above your heart or the center of your chest if you can. When should I get help? You should get help if:  You keep having pain and swelling.  Your symptoms get worse.  Get help right away if: You should get help right away if:  You have sudden bad pain at or below the area of your injury.  You have redness or more swelling around your injury.  You have tingling or numbness at or below the injury that does not go away when you take off the bandage.  This information is not intended to replace advice given to you by your health care provider. Make sure  you discuss any questions you have with your health care provider. Document Released: 08/30/2007 Document Revised: 02/08/2016 Document Reviewed: 02/18/2014 Elsevier Interactive Patient Education  2017 [employer]  Shoulder Range of Motion Exercises Shoulder range of motion (ROM) exercises are designed to keep the shoulder moving freely. They are often recommended for people who have shoulder pain. Phase 1 exercises When you are able, do this exercise 5-6 days per week, or as told by your health care provider. Work toward doing 2 sets of 10 swings. Pendulum Exercise How To Do This Exercise Lying Down 1. Lie face-down on a bed with your abdomen close to the side of the bed. 2. Let your arm hang over the side of the bed. 3. Relax your shoulder, arm, and hand. 4. Slowly and gently swing your arm forward and back. Do not use your neck muscles to swing your arm. They should be relaxed. If you are struggling to swing your arm, have someone gently swing it for you. When you do this exercise for the first time, swing your arm at a 15 degree angle for 15 seconds, or swing your arm 10 times. As pain lessens over time, increase the angle of the swing to 30-45 degrees. 5. Repeat steps 1-4 with the other arm.  How To Do This Exercise While Standing 1. Stand next to a sturdy chair or table and hold on to it with  your hand. 1. Bend forward at the waist. 2. Bend your knees slightly. 3. Relax your other arm and let it hang limp. 4. Relax the shoulder blade of the arm that is hanging and let it drop. 5. While keeping your shoulder relaxed, use body motion to swing your arm in small circles. The first time you do this exercise, swing your arm for about 30 seconds or 10 times. When you do it next time, swing your arm for a little longer. 6. Stand up tall and relax. 7. Repeat steps 1-7, this time changing the direction of the circles. 2. Repeat steps 1-8 with the other arm.  Phase 2 exercises Do these  exercises 3-4 times per day on 5-6 days per week or as told by your health care provider. Work toward holding the stretch for 20 seconds. Stretching Exercise 1 1. Lift your arm straight out in front of you. 2. Bend your arm 90 degrees at the elbow (right angle) so your forearm goes across your body and looks like the letter "L." 3. Use your other arm to gently pull the elbow forward and across your body. 4. Repeat steps 1-3 with the other arm. Stretching Exercise 2 You will need a towel or rope for this exercise. 1. Bend one arm behind your back with the palm facing outward. 2. Hold a towel with your other hand. 3. Reach the arm that holds the towel above your head, and bend that arm at the elbow. Your wrist should be behind your neck. 4. Use your free hand to grab the free end of the towel. 5. With the higher hand, gently pull the towel up behind you. 6. With the lower hand, pull the towel down behind you. 7. Repeat steps 1-6 with the other arm.  Phase 3 exercises Do each of these exercises at four different times of day (sessions) every day or as told by your health care provider. To begin with, repeat each exercise 5 times (repetitions). Work toward doing 3 sets of 12 repetitions or as told by your health care provider. Strengthening Exercise 1 You will need a light weight for this activity. As you grow stronger, you may use a heavier weight. 1. Standing with a weight in your hand, lift your arm straight out to the side until it is at the same height as your shoulder. 2. Bend your arm at 90 degrees so that your fingers are pointing to the ceiling. 3. Slowly raise your hand until your arm is straight up in the air. 4. Repeat steps 1-3 with the other arm.  Strengthening Exercise 2 You will need a light weight for this activity. As you grow stronger, you may use a heavier weight. 1. Standing with a weight in your hand, gradually move your straight arm in an arc, starting at your side, then  out in front of you, then straight up over your head. 2. Gradually move your other arm in an arc, starting at your side, then out in front of you, then straight up over your head. 3. Repeat steps 1-2 with the other arm.  Strengthening Exercise 3 You will need an elastic band for this activity. As you grow stronger, gradually increase the size of the bands or increase the number of bands that you use at one time. 1. While standing, hold an elastic band in one hand and raise that arm up in the air. 2. With your other hand, pull down the band until that hand is by your  side. 3. Repeat steps 1-2 with the other arm.  This information is not intended to replace advice given to you by your health care provider. Make sure you discuss any questions you have with your health care provider. Document Released: 12/10/2002 Document Revised: 11/07/2015 Document Reviewed: 03/09/2014 Elsevier Interactive Patient Education  [employer].

## 2017-02-01 ENCOUNTER — Ambulatory Visit: Payer: Self-pay | Admitting: *Deleted

## 2017-02-01 NOTE — Telephone Encounter (Signed)
Pt states he awakened from sleep and experienced pain in his shoulder. Pt seen on yesterday for the same complaint of shoulder pain and was given prescriptions for zanaflex and mobic. Pt states he not taken a dose of Mobic today. Encouraged pt to take dose of Mobic now and keep ice on the shoulder intermittently, and to also to take a dose of zanaflex tonight at bedtime. Advised pt to go to local pharmacy or [facility] to obtain reusable ice pack to help with pain relief and to use tylenol for break through pain. Pt advised to call back if increase with pain and current interventions are not working to relieve pain.

## 2017-02-06 ENCOUNTER — Ambulatory Visit: Payer: Self-pay | Admitting: Family Medicine

## 2017-02-16 ENCOUNTER — Other Ambulatory Visit: Payer: Self-pay | Admitting: Family Medicine

## 2017-02-16 DIAGNOSIS — M25511 Pain in right shoulder: Secondary | ICD-10-CM

## 2017-02-19 NOTE — Telephone Encounter (Signed)
Please call patient and ask how he is feeling - I had asked that he schedule a 2-week follow up for his shoulder pain on 01/31/2017 and a CPE both with Dr. Sanda Klein. He needs to schedule and attend these appointments in order to obtain a refill. Thanks!

## 2017-02-19 NOTE — Telephone Encounter (Signed)
Left message for patient to call office regarding appointment and refills

## 2017-03-08 ENCOUNTER — Other Ambulatory Visit: Payer: Self-pay | Admitting: Family Medicine

## 2017-03-08 DIAGNOSIS — M25511 Pain in right shoulder: Secondary | ICD-10-CM

## 2017-03-09 NOTE — Telephone Encounter (Signed)
Meloxicam was to help with acute pain of the shoulder. Please call and ask how he is feeling, if he is not improving, he needs to return for follow up. Thanks!

## 2018-01-09 ENCOUNTER — Encounter: Payer: BLUE CROSS/BLUE SHIELD | Admitting: Family Medicine

## 2018-01-22 ENCOUNTER — Ambulatory Visit (INDEPENDENT_AMBULATORY_CARE_PROVIDER_SITE_OTHER): Payer: BLUE CROSS/BLUE SHIELD | Admitting: Family Medicine

## 2018-01-22 ENCOUNTER — Encounter: Payer: Self-pay | Admitting: Family Medicine

## 2018-01-22 ENCOUNTER — Encounter: Payer: Self-pay | Admitting: *Deleted

## 2018-01-22 ENCOUNTER — Encounter: Payer: BLUE CROSS/BLUE SHIELD | Admitting: Family Medicine

## 2018-01-22 ENCOUNTER — Telehealth: Payer: Self-pay | Admitting: *Deleted

## 2018-01-22 ENCOUNTER — Other Ambulatory Visit (HOSPITAL_COMMUNITY)
Admission: RE | Admit: 2018-01-22 | Discharge: 2018-01-22 | Disposition: A | Discharge status: Home / Self Care | Payer: BLUE CROSS/BLUE SHIELD | Source: Ambulatory Visit | Attending: Family Medicine | Admitting: Family Medicine

## 2018-01-22 VITALS — BP 132/74 | HR 83 | Temp 98.2°F | Resp 16 | Ht 68.0 in | Wt 217.2 lb

## 2018-01-22 DIAGNOSIS — Z23 Encounter for immunization: Secondary | ICD-10-CM

## 2018-01-22 DIAGNOSIS — E782 Mixed hyperlipidemia: Secondary | ICD-10-CM | POA: Diagnosis not present

## 2018-01-22 DIAGNOSIS — R7303 Prediabetes: Secondary | ICD-10-CM

## 2018-01-22 DIAGNOSIS — Z1159 Encounter for screening for other viral diseases: Secondary | ICD-10-CM | POA: Diagnosis not present

## 2018-01-22 DIAGNOSIS — Z113 Encounter for screening for infections with a predominantly sexual mode of transmission: Secondary | ICD-10-CM | POA: Diagnosis not present

## 2018-01-22 DIAGNOSIS — R5383 Other fatigue: Secondary | ICD-10-CM | POA: Diagnosis not present

## 2018-01-22 DIAGNOSIS — Z0000 Encounter for general adult medical examination without abnormal findings: Secondary | ICD-10-CM

## 2018-01-22 DIAGNOSIS — I10 Essential (primary) hypertension: Secondary | ICD-10-CM | POA: Diagnosis not present

## 2018-01-22 DIAGNOSIS — Z122 Encounter for screening for malignant neoplasm of respiratory organs: Secondary | ICD-10-CM

## 2018-01-22 DIAGNOSIS — R351 Nocturia: Secondary | ICD-10-CM

## 2018-01-22 NOTE — Progress Notes (Deleted)
132 [id number]  
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
## 2018-01-22 NOTE — Telephone Encounter (Signed)
Called pt r/t ldct screening, pt informed me that he only smoked 1 pk of cigarettes a week and had never smoked more than 2 packs a week.  Paul Ingram denied ever smoking more.  Smoked for 42 years with a 12.6 pkyr history pt is ineligible for the scan. Paul Ingram informed, voiced understanding.   Referring provider Dr. Sanda Klein notified.

## 2018-01-22 NOTE — Patient Instructions (Signed)

## 2018-01-23 ENCOUNTER — Other Ambulatory Visit: Payer: Self-pay | Admitting: Family Medicine

## 2018-01-23 DIAGNOSIS — E7800 Pure hypercholesterolemia, unspecified: Secondary | ICD-10-CM

## 2018-01-23 LAB — CYTOLOGY, (ORAL, ANAL, URETHRAL) ANCILLARY ONLY
Chlamydia: NEGATIVE
Neisseria Gonorrhea: NEGATIVE

## 2018-01-23 MED ORDER — ATORVASTATIN CALCIUM 40 MG PO TABS: 40.0000 mg | ORAL_TABLET | Freq: Every day | ORAL | 1 refills | Status: DC

## 2018-01-24 ENCOUNTER — Telehealth: Payer: Self-pay | Admitting: Family Medicine

## 2018-01-24 NOTE — Telephone Encounter (Signed)
error 

## 2018-01-26 LAB — PSA: PSA: 1.2 ng/mL (ref <=4.0)

## 2018-01-26 LAB — HIV ANTIBODY (ROUTINE TESTING W REFLEX): HIV 1&2 Ab, 4th Generation: NONREACTIVE

## 2018-01-26 LAB — COMPLETE METABOLIC PANEL WITH GFR
AG Ratio: 1.3 (calc) (ref 1.0–2.5)
ALT: 23 U/L (ref 9–46)
AST: 22 U/L (ref 10–35)
Albumin: 3.9 g/dL (ref 3.6–5.1)
Alkaline phosphatase (APISO): 53 U/L (ref 40–115)
BUN: 9 mg/dL (ref 7–25)
CO2: 29 mmol/L (ref 20–32)
Calcium: 9.3 mg/dL (ref 8.6–10.3)
Chloride: 107 mmol/L (ref 98–110)
Creat: 0.97 mg/dL (ref 0.70–1.25)
GFR, Est African American: 96 mL/min/{1.73_m2} (ref >=60)
GFR, Est Non African American: 83 mL/min/{1.73_m2} (ref >=60)
Globulin: 2.9 g/dL (calc) (ref 1.9–3.7)
Glucose, Bld: 87 mg/dL (ref 65–139)
Potassium: 4 mmol/L (ref 3.5–5.3)
Sodium: 143 mmol/L (ref 135–146)
Total Bilirubin: 0.6 mg/dL (ref 0.2–1.2)
Total Protein: 6.8 g/dL (ref 6.1–8.1)

## 2018-01-26 LAB — CBC
HCT: 43.4 % (ref 38.5–50.0)
Hemoglobin: 14.9 g/dL (ref 13.2–17.1)
MCH: 31.6 pg (ref 27.0–33.0)
MCHC: 34.3 g/dL (ref 32.0–36.0)
MCV: 91.9 fL (ref 80.0–100.0)
MPV: 10.9 fL (ref 7.5–12.5)
Platelets: 216 10*3/uL (ref 140–400)
RBC: 4.72 10*6/uL (ref 4.20–5.80)
RDW: 12.6 % (ref 11.0–15.0)
WBC: 7.9 10*3/uL (ref 3.8–10.8)

## 2018-01-26 LAB — LIPID PANEL
Cholesterol: 216 mg/dL — ABNORMAL HIGH (ref <200)
HDL: 71 mg/dL (ref >40)
LDL Cholesterol (Calc): 121 mg/dL (calc) — ABNORMAL HIGH
Non-HDL Cholesterol (Calc): 145 mg/dL (calc) — ABNORMAL HIGH (ref <130)
Total CHOL/HDL Ratio: 3 (calc) (ref <5.0)
Triglycerides: 126 mg/dL (ref <150)

## 2018-01-26 LAB — HEPATITIS C ANTIBODY
Hepatitis C Ab: REACTIVE — AB
SIGNAL TO CUT-OFF: 5.91 — ABNORMAL HIGH (ref <1.00)

## 2018-01-26 LAB — HCV RNA,QUANTITATIVE REAL TIME PCR
HCV Quantitative Log: <1.18 Log IU/mL
HCV RNA, PCR, QN: <15 IU/mL

## 2018-01-26 LAB — TSH: TSH: 1.56 mIU/L (ref 0.40–4.50)

## 2018-01-26 LAB — HEMOGLOBIN A1C
Hgb A1c MFr Bld: 6 % of total Hgb — ABNORMAL HIGH (ref <5.7)
Mean Plasma Glucose: 126 (calc)
eAG (mmol/L): 7 (calc)

## 2018-01-26 LAB — RPR: RPR Ser Ql: NONREACTIVE

## 2018-02-11 ENCOUNTER — Telehealth: Payer: Self-pay

## 2018-02-11 NOTE — Telephone Encounter (Signed)
LeCopied from [place] [phone]. Topic: General - Inquiry >> Feb 11, 2018 10:44 AM Conception Chancy, NT wrote: Reason for CRM: patient is calling and would like his pcp recommendations on a Dentist.

## 2018-02-11 NOTE — Telephone Encounter (Signed)
Left detailed voicemail

## 2018-02-11 NOTE — Telephone Encounter (Signed)
So I don't have any particular names or numbers of anyone I would recommend over any one else We don't make referrals to dentists, so I'm not very help in that regard I'm sorry

## 2018-03-18 ENCOUNTER — Telehealth: Payer: Self-pay | Admitting: Family Medicine

## 2018-03-18 NOTE — Telephone Encounter (Signed)
Call to patient- he has never had this medication before- because this is a new start- he may need an appointment. Patient wants to use the money he has on his spending card before the end of the year. Told patient we would let him know if he needs to come in before the end of the year to discuss a new start medication.

## 2018-03-18 NOTE — Telephone Encounter (Signed)
Copied from [place] [phone]. Topic: Quick Communication - See Telephone Encounter >> Mar 18, 2018  4:20 PM Ivar Drape wrote: CRM for notification. See Telephone encounter for: 03/18/18. Patient wants to know if he can have a prescription for Viagra and have it sent to his preferred pharmacy [facility] [address]

## 2018-03-18 NOTE — Telephone Encounter (Signed)
He needs an appointment

## 2018-03-19 NOTE — Telephone Encounter (Signed)
Spoke with pt and he scheduled an appt with Benjamine Mola for 12.30.19

## 2018-03-25 ENCOUNTER — Encounter: Payer: Self-pay | Admitting: Nurse Practitioner

## 2018-03-25 ENCOUNTER — Ambulatory Visit: Payer: BLUE CROSS/BLUE SHIELD | Admitting: Nurse Practitioner

## 2018-03-25 VITALS — BP 136/86 | HR 95 | Temp 98.0°F | Ht 68.0 in | Wt 212.1 lb

## 2018-03-25 DIAGNOSIS — E7800 Pure hypercholesterolemia, unspecified: Secondary | ICD-10-CM | POA: Diagnosis not present

## 2018-03-25 DIAGNOSIS — Z716 Tobacco abuse counseling: Secondary | ICD-10-CM

## 2018-03-25 DIAGNOSIS — N529 Male erectile dysfunction, unspecified: Secondary | ICD-10-CM | POA: Diagnosis not present

## 2018-03-25 MED ORDER — SILDENAFIL CITRATE 25 MG PO TABS: 25.0000 mg | ORAL_TABLET | Freq: Every day | ORAL | 0 refills | Status: DC | PRN

## 2018-03-25 MED ORDER — ATORVASTATIN CALCIUM 40 MG PO TABS: 40.0000 mg | ORAL_TABLET | Freq: Every day | ORAL | 1 refills | Status: DC
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
## 2018-03-25 NOTE — Patient Instructions (Addendum)
- Please start taking atorvastatin at night time. Please read the information below to lower your cholesterol (LDL). Please stop smoking. Please return in 7 weeks to recheck your lipids and check your testosterone levels.  - I am going to provide you with a course of sildenafil(viagra). You can take this 1-4 hours before planned sexual activity. If you have any side effects please report this to [place] immediately. Side effects that are common include: headaches, flushing, trouble sleeping. If you ever have to call 911 or go to the ER for chest pain please inform them if you took this medicine within 24 hours.    Bad cholesterol, also called low-density lipoprotein (LDL), carries cholesterol and other fats that your liver makes to your body tissue. If it builds up in blood vessels, LDL can cause heart disease and other health problems. Your LDL level should be below 100. If you have diabetes or a possible heart problem, your LDL should be below 70.  Eat: Eat 20 to 30 grams of soluble fiber every day. Foods such as fruits and vegetables, whole grains, beans, peas, nuts, and seeds can help lower LDL. Avoid: Saturated fats (Dairy foods - such as butter, cream, ghee, regular-fat milk and cheese. Meat - such as fatty cuts of beef, pork and lamb, processed meats like salami, sausages and the skin on chicken. Lard., fatty snack foods, cakes, biscuits, pies and deep fried foods) Avoid smoking  Lab Results  Component Value Date   CHOL 216 (H) 01/22/2018   HDL 71 01/22/2018   LDLCALC 121 (H) 01/22/2018   TRIG 126 01/22/2018   CHOLHDL 3.0 01/22/2018     Coping with Quitting Smoking  Quitting smoking is a physical and mental challenge. You will face cravings, withdrawal symptoms, and temptation. Before quitting, work with your health care provider to make a plan that can help you cope. Preparation can help you quit and keep you from giving in. How can I cope with cravings? Cravings usually last for 5-10  minutes. If you get through it, the craving will pass. Consider taking the following actions to help you cope with cravings:  Keep your mouth busy: ? Chew sugar-free gum. ? Suck on hard candies or a straw. ? Brush your teeth.  Keep your hands and body busy: ? Immediately change to a different activity when you feel a craving. ? Squeeze or play with a ball. ? Do an activity or a hobby, like making bead jewelry, practicing needlepoint, or working with wood. ? Mix up your normal routine. ? Take a short exercise break. Go for a quick walk or run up and down stairs. ? Spend time in public places where smoking is not allowed.  Focus on doing something kind or helpful for someone else.  Call a friend or family member to talk during a craving.  Join a support group.  Call a quit line, such as 1-800-QUIT-NOW.  Talk with your health care provider about medicines that might help you cope with cravings and make quitting easier for you. How can I deal with withdrawal symptoms? Your body may experience negative effects as it tries to get used to not having nicotine in the system. These effects are called withdrawal symptoms. They may include:  Feeling hungrier than normal.  Trouble concentrating.  Irritability.  Trouble sleeping.  Feeling depressed.  Restlessness and agitation.  Craving a cigarette. To manage withdrawal symptoms:  Avoid places, people, and activities that trigger your cravings.  Remember why you want to  quit.  Get plenty of sleep.  Avoid coffee and other caffeinated drinks. These may worsen some of your symptoms. How can I handle social situations? Social situations can be difficult when you are quitting smoking, especially in the first few weeks. To manage this, you can:  Avoid parties, bars, and other social situations where people might be smoking.  Avoid alcohol.  Leave right away if you have the urge to smoke.  Explain to your family and friends that  you are quitting smoking. Ask for understanding and support.  Plan activities with friends or family where smoking is not an option. What are some ways I can cope with stress? Wanting to smoke may cause stress, and stress can make you want to smoke. Find ways to manage your stress. Relaxation techniques can help. For example:  Breathe slowly and deeply, in through your nose and out through your mouth.  Listen to soothing, relaxing music.  Talk with a family member or friend about your stress.  Light a candle.  Soak in a bath or take a shower.  Think about a peaceful place. What are some ways I can prevent weight gain? Be aware that many people gain weight after they quit smoking. However, not everyone does. To keep from gaining weight, have a plan in place before you quit and stick to the plan after you quit. Your plan should include:  Having healthy snacks. When you have a craving, it may help to: ? Eat plain popcorn, crunchy carrots, celery, or other cut vegetables. ? Chew sugar-free gum.  Changing how you eat: ? Eat small portion sizes at meals. ? Eat 4-6 small meals throughout the day instead of 1-2 large meals a day. ? Be mindful when you eat. Do not watch television or do other things that might distract you as you eat.  Exercising regularly: ? Make time to exercise each day. If you do not have time for a long workout, do short bouts of exercise for 5-10 minutes several times a day. ? Do some form of strengthening exercise, like weight lifting, and some form of aerobic exercise, like running or swimming.  Drinking plenty of water or other low-calorie or no-calorie drinks. Drink 6-8 glasses of water daily, or as much as instructed by your health care provider. Summary  Quitting smoking is a physical and mental challenge. You will face cravings, withdrawal symptoms, and temptation to smoke again. Preparation can help you as you go through these challenges.  You can cope with  cravings by keeping your mouth busy (such as by chewing gum), keeping your body and hands busy, and making calls to family, friends, or a helpline for people who want to quit smoking.  You can cope with withdrawal symptoms by avoiding places where people smoke, avoiding drinks with caffeine, and getting plenty of rest.  Ask your health care provider about the different ways to prevent weight gain, avoid stress, and handle social situations. This information is not intended to replace advice given to you by your health care provider. Make sure you discuss any questions you have with your health care provider. Document Released: 03/10/2016 Document Revised: 03/10/2016 Document Reviewed: 03/10/2016 Elsevier Interactive Patient Education  2019 [employer].

## 2018-04-25 ENCOUNTER — Ambulatory Visit: Payer: BLUE CROSS/BLUE SHIELD | Admitting: Family Medicine

## 2018-05-13 ENCOUNTER — Ambulatory Visit: Payer: BLUE CROSS/BLUE SHIELD | Admitting: Family Medicine

## 2018-08-28 ENCOUNTER — Encounter: Payer: Self-pay | Admitting: Nurse Practitioner

## 2018-08-28 ENCOUNTER — Ambulatory Visit: Payer: BLUE CROSS/BLUE SHIELD | Admitting: Nurse Practitioner

## 2018-08-28 ENCOUNTER — Other Ambulatory Visit: Payer: Self-pay

## 2018-08-28 VITALS — BP 130/82 | HR 90 | Temp 98.1°F | Resp 12 | Ht 68.0 in | Wt 209.6 lb

## 2018-08-28 DIAGNOSIS — R400 Somnolence: Secondary | ICD-10-CM

## 2018-08-28 DIAGNOSIS — N529 Male erectile dysfunction, unspecified: Secondary | ICD-10-CM | POA: Diagnosis not present

## 2018-08-28 DIAGNOSIS — E782 Mixed hyperlipidemia: Secondary | ICD-10-CM | POA: Diagnosis not present

## 2018-08-28 DIAGNOSIS — I10 Essential (primary) hypertension: Secondary | ICD-10-CM | POA: Diagnosis not present

## 2018-08-28 DIAGNOSIS — E7800 Pure hypercholesterolemia, unspecified: Secondary | ICD-10-CM

## 2018-08-28 DIAGNOSIS — R7303 Prediabetes: Secondary | ICD-10-CM | POA: Diagnosis not present

## 2018-08-28 DIAGNOSIS — Z5181 Encounter for therapeutic drug level monitoring: Secondary | ICD-10-CM | POA: Diagnosis not present

## 2018-08-28 DIAGNOSIS — Z113 Encounter for screening for infections with a predominantly sexual mode of transmission: Secondary | ICD-10-CM | POA: Diagnosis not present

## 2018-08-28 DIAGNOSIS — R0683 Snoring: Secondary | ICD-10-CM

## 2018-08-28 MED ORDER — SILDENAFIL CITRATE 25 MG PO TABS: 25.0000 mg | ORAL_TABLET | Freq: Every day | ORAL | 0 refills | Status: DC | PRN

## 2018-08-28 NOTE — Patient Instructions (Signed)
-   Increase water in diet at least 64 ounces a day - General recommendations: 150 minutes of physical activity weekly, eat two servings of fish weekly, eat one serving of tree nuts ( cashews, pistachios, pecans, almonds.Marland Kitchen) every other day, eat 6 servings of fruit/vegetables daily and drink plenty of water and avoid sweet beverages.  -Foods and drinks to limit include  . fried foods and other foods high in saturated fat and trans fat  . foods high in salt, also called sodium  . sweets, such as baked goods, candy, and ice cream  . beverages with added sugars, such as juice, regular soda, and regular sports or energy drinks  Drink water instead of sweetened beverages. Consider using a sugar substitute in your coffee or tea.   Instead, eat carbohydrates from fruit, vegetables, whole grains, beans, and low-fat or nonfat milk. Choose healthy carbohydrates, such as fruit, vegetables, whole grains, beans, and low-fat milk.  -If you have not heard anything from my staff in a week about any orders/referrals/studies from today, please contact us here to follow-up [phone]
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
## 2018-08-29 LAB — HIV ANTIBODY (ROUTINE TESTING W REFLEX): HIV 1&2 Ab, 4th Generation: NONREACTIVE

## 2018-08-29 LAB — RPR: RPR Ser Ql: NONREACTIVE

## 2018-08-29 LAB — LIPID PANEL
Cholesterol: 222 mg/dL — ABNORMAL HIGH (ref <200)
HDL: 98 mg/dL (ref >=40)
LDL Cholesterol (Calc): 105 mg/dL (calc) — ABNORMAL HIGH
Non-HDL Cholesterol (Calc): 124 mg/dL (calc) (ref <130)
Total CHOL/HDL Ratio: 2.3 (calc) (ref <5.0)
Triglycerides: 99 mg/dL (ref <150)

## 2018-08-29 LAB — COMPLETE METABOLIC PANEL WITH GFR
AG Ratio: 1.6 (calc) (ref 1.0–2.5)
ALT: 49 U/L — ABNORMAL HIGH (ref 9–46)
AST: 33 U/L (ref 10–35)
Albumin: 4.6 g/dL (ref 3.6–5.1)
Alkaline phosphatase (APISO): 58 U/L (ref 35–144)
BUN: 13 mg/dL (ref 7–25)
CO2: 24 mmol/L (ref 20–32)
Calcium: 9.8 mg/dL (ref 8.6–10.3)
Chloride: 105 mmol/L (ref 98–110)
Creat: 0.98 mg/dL (ref 0.70–1.25)
GFR, Est African American: 94 mL/min/{1.73_m2} (ref >=60)
GFR, Est Non African American: 81 mL/min/{1.73_m2} (ref >=60)
Globulin: 2.8 g/dL (calc) (ref 1.9–3.7)
Glucose, Bld: 109 mg/dL — ABNORMAL HIGH (ref 65–99)
Potassium: 3.9 mmol/L (ref 3.5–5.3)
Sodium: 139 mmol/L (ref 135–146)
Total Bilirubin: 0.9 mg/dL (ref 0.2–1.2)
Total Protein: 7.4 g/dL (ref 6.1–8.1)

## 2018-08-29 LAB — HEMOGLOBIN A1C
Hgb A1c MFr Bld: 5.9 % of total Hgb — ABNORMAL HIGH (ref <5.7)
Mean Plasma Glucose: 123 (calc)
eAG (mmol/L): 6.8 (calc)

## 2018-08-30 ENCOUNTER — Telehealth: Payer: Self-pay | Admitting: Family Medicine

## 2018-08-30 LAB — URINE CYTOLOGY ANCILLARY ONLY
Chlamydia: NEGATIVE
Neisseria Gonorrhea: NEGATIVE

## 2018-08-30 NOTE — Telephone Encounter (Signed)
Please call pt [phone] or [phone]. Said someone called him but do not see who it may have been

## 2018-09-02 NOTE — Telephone Encounter (Signed)
Unsure of why patient was called, returned patients call, unable to reach patient.

## 2018-09-03 ENCOUNTER — Ambulatory Visit: Payer: Self-pay | Admitting: *Deleted

## 2018-09-03 NOTE — Telephone Encounter (Signed)
Pt called in and was given lab results from Suezanne Cheshire, NP dated 09/02/2018 at 1:17 PM  No questions from pt

## 2018-10-08 ENCOUNTER — Telehealth: Payer: Self-pay | Admitting: Internal Medicine

## 2018-10-08 NOTE — Telephone Encounter (Signed)
Called patient for COVID-19 pre-screening for in office visit.  Have you recently traveled any where out of the local area in the last 2 weeks? no  Have you been in close contact with a person diagnosed with COVID-19 or someone awaiting results within the last 2 weeks? no  Do you currently have any of the following symptoms? If so, when did they start? Cough     Diarrhea   Joint Pain Fever      Muscle Pain   Red eyes Shortness of breath   Abdominal pain  Vomiting Loss of smell    Rash    Sore Throat Headache    Weakness   Bruising or bleeding   Okay to proceed with visit.      

## 2018-10-09 ENCOUNTER — Encounter: Payer: Self-pay | Admitting: Internal Medicine

## 2018-10-09 ENCOUNTER — Ambulatory Visit: Payer: BC Managed Care – PPO | Admitting: Internal Medicine

## 2018-10-09 ENCOUNTER — Other Ambulatory Visit: Payer: Self-pay

## 2018-10-09 VITALS — BP 160/98 | HR 99 | Temp 97.7°F | Ht 68.0 in | Wt 212.2 lb

## 2018-10-09 DIAGNOSIS — F17210 Nicotine dependence, cigarettes, uncomplicated: Secondary | ICD-10-CM

## 2018-10-09 DIAGNOSIS — G4719 Other hypersomnia: Secondary | ICD-10-CM | POA: Diagnosis not present

## 2018-10-09 NOTE — Patient Instructions (Addendum)
Will send for sleep study.  --Quitting smoking is the most important thing that you can do for your health.  --Quitting smoking will have greater affect on your health than any medicine that we can give you.    Sleep Apnea    Sleep apnea is disorder that affects a person's sleep. A person with sleep apnea has abnormal pauses in their breathing when they sleep. It is hard for them to get a good sleep. This makes a person tired during the day. It also can lead to other physical problems. There are three types of sleep apnea. One type is when breathing stops for a short time because your airway is blocked (obstructive sleep apnea). Another type is when the brain sometimes fails to give the normal signal to breathe to the muscles that control your breathing (central sleep apnea). The third type is a combination of the other two types.  HOME CARE   Take all medicine as told by your doctor.  Avoid alcohol, calming medicines (sedatives), and depressant drugs.  Try to lose weight if you are overweight. Talk to your doctor about a healthy weight goal.  Your doctor may have you use a device that helps to open your airway. It can help you get the air that you need. It is called a positive airway pressure (PAP) device.   MAKE SURE YOU:   Understand these instructions.  Will watch your condition.  Will get help right away if you are not doing well or get worse.  It may take approximately 1 month for you to get used to wearing her CPAP every night.  Be sure to work with your machine to get used to it, be patient, it may take time!  If you have trouble tolerating CPAP DO NOT RETURN YOUR MACHINE; Contact our office to see if we can help you tolerate the CPAP better first!    
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
## 2018-10-14 ENCOUNTER — Telehealth: Payer: Self-pay | Admitting: Nurse Practitioner

## 2018-10-14 DIAGNOSIS — N529 Male erectile dysfunction, unspecified: Secondary | ICD-10-CM

## 2018-10-14 NOTE — Telephone Encounter (Signed)
Please schedule a routine follow up in 5 months

## 2018-10-15 NOTE — Telephone Encounter (Signed)
lvm for pt to call the office to schedule an appt  °

## 2018-10-23 ENCOUNTER — Ambulatory Visit: Payer: BC Managed Care – PPO

## 2018-10-23 ENCOUNTER — Other Ambulatory Visit: Payer: Self-pay

## 2018-10-23 DIAGNOSIS — G4719 Other hypersomnia: Secondary | ICD-10-CM

## 2018-10-23 DIAGNOSIS — G4733 Obstructive sleep apnea (adult) (pediatric): Secondary | ICD-10-CM

## 2018-10-24 DIAGNOSIS — G4733 Obstructive sleep apnea (adult) (pediatric): Secondary | ICD-10-CM | POA: Diagnosis not present

## 2018-10-25 ENCOUNTER — Telehealth: Payer: Self-pay | Admitting: Internal Medicine

## 2018-10-25 DIAGNOSIS — G4719 Other hypersomnia: Secondary | ICD-10-CM

## 2018-10-25 DIAGNOSIS — G4733 Obstructive sleep apnea (adult) (pediatric): Secondary | ICD-10-CM

## 2018-10-25 NOTE — Telephone Encounter (Signed)
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  Recommend auto cpap 5-20cm h2O.  Pt would like to call back to discuss results further, as he is currently at work.

## 2018-10-29 NOTE — Telephone Encounter (Signed)
Attempted to reach pt without success LMOM for pt to return call.

## 2018-11-01 NOTE — Telephone Encounter (Signed)
Pt called back and wants to proceed with CPAP therapy  Order was placed

## 2018-12-30 ENCOUNTER — Other Ambulatory Visit: Payer: Self-pay

## 2018-12-30 DIAGNOSIS — N529 Male erectile dysfunction, unspecified: Secondary | ICD-10-CM

## 2018-12-31 MED ORDER — SILDENAFIL CITRATE 25 MG PO TABS: 25.0000 mg | ORAL_TABLET | Freq: Every day | ORAL | 1 refills | Status: DC | PRN

## 2019-01-14 ENCOUNTER — Other Ambulatory Visit: Payer: Self-pay

## 2019-01-14 DIAGNOSIS — N529 Male erectile dysfunction, unspecified: Secondary | ICD-10-CM

## 2019-01-14 DIAGNOSIS — E7800 Pure hypercholesterolemia, unspecified: Secondary | ICD-10-CM

## 2019-01-14 MED ORDER — ATORVASTATIN CALCIUM 40 MG PO TABS: 40.0000 mg | ORAL_TABLET | Freq: Every day | ORAL | 1 refills | Status: DC

## 2019-01-16 ENCOUNTER — Other Ambulatory Visit: Payer: Self-pay

## 2019-01-16 ENCOUNTER — Encounter: Payer: Self-pay | Admitting: Family Medicine

## 2019-01-16 ENCOUNTER — Ambulatory Visit: Payer: BC Managed Care – PPO | Admitting: Family Medicine

## 2019-01-16 VITALS — BP 132/84 | HR 80 | Temp 97.8°F | Resp 16 | Ht 68.0 in | Wt 220.4 lb

## 2019-01-16 DIAGNOSIS — N529 Male erectile dysfunction, unspecified: Secondary | ICD-10-CM

## 2019-01-16 DIAGNOSIS — M542 Cervicalgia: Secondary | ICD-10-CM

## 2019-01-16 MED ORDER — SILDENAFIL CITRATE 25 MG PO TABS: 25.0000 mg | ORAL_TABLET | Freq: Every day | ORAL | 1 refills | Status: DC | PRN

## 2019-01-16 MED ORDER — TIZANIDINE HCL 4 MG PO TABS: 2.0000 mg | ORAL_TABLET | Freq: Three times a day (TID) | ORAL | 0 refills | Status: DC | PRN

## 2019-01-16 MED ORDER — MELOXICAM 7.5 MG PO TABS: 7.5000 mg | ORAL_TABLET | Freq: Every day | ORAL | 0 refills | Status: DC

## 2019-01-16 NOTE — Patient Instructions (Signed)

## 2019-01-16 NOTE — Progress Notes (Signed)
Name: Paul Ingram   MRN: [id number]    DOB: Apr 07, 1955   Date:01/16/2019       Progress Note  Subjective  Chief Complaint  Chief Complaint  Patient presents with  . Shoulder Pain    right side for 1 week  . Neck Pain    HPI  PT presents with concern for LEFT sided neck and shoulder pain for about 8 days now.  He has some stiffness on the left side of his neck, but no radiation into the arm, no numbness/tingling/weakness in the left arm.  He does endorse headache from the left base of the skull radiating upwards. He has no known injury or overuse to the area that he knows of.  Has tried [facility] powder without relief of symptoms.  Has never had pain like this before.    Patient Active Problem List   Diagnosis Date Noted  . Obesity 09/19/2015  . Eczema 02/24/2015  . Elevated cholesterol with elevated triglycerides 08/26/2014  . Hypertension goal BP (blood pressure) < 140/90 08/26/2014  . Plantar verruca 08/26/2014  . Borderline diabetes 08/26/2014    Social History   Tobacco Use  . Smoking status: Light Tobacco Smoker    Packs/day: 0.30    Years: 42.00    Pack years: 12.60    Types: Cigarettes  . Smokeless tobacco: Never Used  . Tobacco comment: patient stated he doesn't smoke enough to quit  Substance Use Topics  . Alcohol use: Yes    Alcohol/week: 0.0 standard drinks    Comment: during special events and games     Current Outpatient Medications:  .  atorvastatin (LIPITOR) 40 MG tablet, Take 1 tablet (40 mg total) by mouth daily., Disp: 90 tablet, Rfl: 1 .  sildenafil (VIAGRA) 25 MG tablet, Take 1 tablet (25 mg total) by mouth daily as needed for erectile dysfunction., Disp: 8 tablet, Rfl: 1  Allergies  Allergen Reactions  . Shellfish Allergy Anaphylaxis    I personally reviewed active problem list, medication list, allergies, health maintenance, notes from last encounter, lab results with the patient/caregiver today.  ROS  Ten systems reviewed and is  negative except as mentioned in HPI  Objective  Vitals:   01/16/19 0728  BP: 132/84  Pulse: 80  Resp: 16  Temp: 97.8 F (36.6 C)  TempSrc: Oral  SpO2: 98%  Weight: 220 lb 6.4 oz (100 kg)  Height: 5\' 8"  (1.727 m)    Body mass index is 33.51 kg/m.  Nursing Note and Vital Signs reviewed.  Physical Exam  Constitutional: Patient appears well-developed and well-nourished. No distress.  HENT: Head: Normocephalic and atraumatic. Ears: bilateral TMs with no erythema or effusion; Nose: Nose normal. Mouth/Throat: Oropharynx is clear and moist. No oropharyngeal exudate or tonsillar swelling.  Eyes: Conjunctivae and EOM are normal. No scleral icterus.  Pupils are equal, round, and reactive to light.  Neck: Decreased AROM on flexion to the left side.  Otherwise neck WNL.  Neck supple. No JVD present. No thyromegaly present.  Cardiovascular: Normal rate, regular rhythm and normal heart sounds.  No murmur heard. No BLE edema. Pulmonary/Chest: Effort normal and breath sounds normal. No respiratory distress. Musculoskeletal: Normal range of motion, no joint effusions. No gross deformities. Normal strength of BUE. Neurological: Pt is alert and oriented to person, place, and time. No cranial nerve deficit. Coordination, balance, strength, speech and gait are normal.  Skin: Skin is warm and dry. No rash noted. No erythema.  Psychiatric: Patient has a normal  mood and affect. behavior is normal. Judgment and thought content normal.  No results found for this or any previous visit (from the past 72 hour(s)).  Assessment & Plan  1. Neck pain on left side - Exercises discussed in detail. - tiZANidine (ZANAFLEX) 4 MG tablet; Take 0.5-1 tablets (2-4 mg total) by mouth every 8 (eight) hours as needed for muscle spasms. *Muscle Relaxer*  Dispense: 20 tablet; Refill: 0 - meloxicam (MOBIC) 7.5 MG tablet; Take 1 tablet (7.5 mg total) by mouth daily. *Non-drowsy, can take in the morning*  Dispense: 20  tablet; Refill: 0  2. Erectile dysfunction, unspecified erectile dysfunction type - sildenafil (VIAGRA) 25 MG tablet; Take 1 tablet (25 mg total) by mouth daily as needed for erectile dysfunction.  Dispense: 30 tablet; Refill: 1  -Red flags and when to present for emergency care or RTC including fever >101.55F, chest pain, shortness of breath, new/worsening/un-resolving symptoms, reviewed with patient at time of visit. Follow up and care instructions discussed and provided in AVS.

## 2019-01-22 ENCOUNTER — Other Ambulatory Visit: Payer: Self-pay | Admitting: *Deleted

## 2019-01-22 DIAGNOSIS — Z20822 Contact with and (suspected) exposure to covid-19: Secondary | ICD-10-CM

## 2019-01-23 ENCOUNTER — Telehealth: Payer: Self-pay | Admitting: Family Medicine

## 2019-01-23 DIAGNOSIS — M542 Cervicalgia: Secondary | ICD-10-CM

## 2019-01-23 LAB — NOVEL CORONAVIRUS, NAA: SARS-CoV-2, NAA: NOT DETECTED

## 2019-01-23 NOTE — Telephone Encounter (Signed)
Pt called and stated that medication meloxicam (MOBIC) 7.5 MG tablet [id number] and tiZANidine (ZANAFLEX) 4 MG tablet [id number] is not working. Pt states that he still has pain in shoulder and neck. Pt would like a call back from the nurse. Please advise

## 2019-01-24 MED ORDER — PREDNISONE 5 MG (48) PO TBPK: ORAL_TABLET | ORAL | 0 refills | Status: DC

## 2019-01-24 NOTE — Telephone Encounter (Signed)
Pt calling back to check status. Pt states that he is in a lot of pain and would like a call back today if possible. Please advise

## 2019-01-24 NOTE — Telephone Encounter (Signed)
Called to inform pt of refill sent to pharmacy and to schedule if needed

## 2019-01-24 NOTE — Telephone Encounter (Signed)
Please schedule appoinment

## 2019-01-24 NOTE — Telephone Encounter (Signed)
Prednisone taper sent in for him to trial; if not seeing improvement with this, will need to schedule follow up.

## 2019-01-24 NOTE — Telephone Encounter (Signed)
Pt returned office call. Made pt aware of Rx at the pharmacy. Also scheduled pt a ov with PCP.

## 2019-01-27 ENCOUNTER — Other Ambulatory Visit: Payer: Self-pay

## 2019-01-27 ENCOUNTER — Encounter: Payer: Self-pay | Admitting: Family Medicine

## 2019-01-27 ENCOUNTER — Ambulatory Visit
Admission: RE | Admit: 2019-01-27 | Discharge: 2019-01-27 | Disposition: A | Discharge status: Home / Self Care | Payer: BC Managed Care – PPO | Source: Ambulatory Visit | Attending: Family Medicine | Admitting: Family Medicine

## 2019-01-27 ENCOUNTER — Ambulatory Visit (INDEPENDENT_AMBULATORY_CARE_PROVIDER_SITE_OTHER): Payer: BC Managed Care – PPO | Admitting: Family Medicine

## 2019-01-27 VITALS — BP 142/64 | HR 93 | Temp 98.3°F | Resp 14 | Ht 72.0 in | Wt 220.6 lb

## 2019-01-27 DIAGNOSIS — M62838 Other muscle spasm: Secondary | ICD-10-CM | POA: Insufficient documentation

## 2019-01-27 DIAGNOSIS — M542 Cervicalgia: Secondary | ICD-10-CM | POA: Insufficient documentation

## 2019-01-27 DIAGNOSIS — S161XXA Strain of muscle, fascia and tendon at neck level, initial encounter: Secondary | ICD-10-CM

## 2019-01-27 DIAGNOSIS — M47812 Spondylosis without myelopathy or radiculopathy, cervical region: Secondary | ICD-10-CM | POA: Diagnosis not present

## 2019-01-27 DIAGNOSIS — R29898 Other symptoms and signs involving the musculoskeletal system: Secondary | ICD-10-CM | POA: Insufficient documentation

## 2019-01-27 MED ORDER — KETOROLAC TROMETHAMINE 30 MG/ML IJ SOLN: 30.0000 mg | Freq: Once | INTRAMUSCULAR | Status: DC

## 2019-01-27 MED ORDER — KETOROLAC TROMETHAMINE 60 MG/2ML IM SOLN
30.0000 mg | Freq: Once | INTRAMUSCULAR | Status: AC
  Administered 2019-01-27: 30 mg via INTRAMUSCULAR

## 2019-01-27 MED ORDER — CYCLOBENZAPRINE HCL 10 MG PO TABS: 5.0000 mg | ORAL_TABLET | Freq: Three times a day (TID) | ORAL | 0 refills | Status: DC | PRN

## 2019-01-27 MED ORDER — MELOXICAM 15 MG PO TABS: 15.0000 mg | ORAL_TABLET | Freq: Every day | ORAL | 0 refills | Status: DC

## 2019-01-27 NOTE — Progress Notes (Addendum)
Patient ID: [name], male    DOB: 02/23/1956, 63 y.o.   MRN: [id number]  PCP: Delsa Grana, PA-C  Chief Complaint  Patient presents with   Neck Pain    not any better seen Paul Ingram couple of weeks ago, started prednisone friday    Subjective:   Paul Ingram is a 63 y.o. male, presents to clinic with CC of the following:   Neck pain, seen 11 days ago, suspected MSK pain, tx with muscle relaxers, was seen here by Paul Ingram, returns today without improvement:  See recent visit:  01/16/2019 HPI PT presents with concern for LEFT sided neck and shoulder pain for about 8 days now.  He has some stiffness on the left side of his neck, but no radiation into the arm, no numbness/tingling/weakness in the left arm.  He does endorse headache from the left base of the skull radiating upwards. He has no known injury or overuse to the area that he knows of.  Has tried [facility] powder without relief of symptoms.  Has never had pain like this before.   A&P 01/16/2019: 1. Neck pain on left side - Exercises discussed in detail. - tiZANidine (ZANAFLEX) 4 MG tablet; Take 0.5-1 tablets (2-4 mg total) by mouth every 8 (eight) hours as needed for muscle spasms. *Muscle Relaxer*  Dispense: 20 tablet; Refill: 0 - meloxicam (MOBIC) 7.5 MG tablet; Take 1 tablet (7.5 mg total) by mouth daily. *Non-drowsy, can take in the morning*  Dispense: 20 tablet; Refill: 0  Pt has used mobic and zanaflex, just started steroids 3 days ago, no improvement.   Neck Pain  This is a new problem. Episode onset: 3 weeks. The problem occurs constantly. The problem has been gradually worsening. The pain is associated with nothing. Pain location: left posterior neck cervical paraspinal muscles. The quality of the pain is described as cramping and aching. The pain is at a severity of 10/10. The symptoms are aggravated by twisting. The pain is worse during the night. Associated symptoms include headaches. Pertinent negatives include no chest  pain, fever, numbness, pain with swallowing, paresis, photophobia, tingling, visual change or weakness. He has tried NSAIDs, muscle relaxants and heat (steroids) for the symptoms. The treatment provided no relief (sx worse).     Patient Active Problem List   Diagnosis Date Noted   Obesity 09/19/2015   Eczema 02/24/2015   Elevated cholesterol with elevated triglycerides 08/26/2014   Hypertension goal BP (blood pressure) < 140/90 08/26/2014   Plantar verruca 08/26/2014   Borderline diabetes 08/26/2014      Current Outpatient Medications:    atorvastatin (LIPITOR) 40 MG tablet, Take 1 tablet (40 mg total) by mouth daily., Disp: 90 tablet, Rfl: 1   meloxicam (MOBIC) 7.5 MG tablet, Take 7.5 mg by mouth daily., Disp: , Rfl:    predniSONE (STERAPRED UNI-PAK 48 TAB) 5 MG (48) TBPK tablet, Take as directed, Disp: 48 tablet, Rfl: 0   tiZANidine (ZANAFLEX) 4 MG tablet, Take 4 mg by mouth every 6 (six) hours as needed for muscle spasms., Disp: , Rfl:    sildenafil (VIAGRA) 25 MG tablet, Take 1 tablet (25 mg total) by mouth daily as needed for erectile dysfunction. (Patient not taking: Reported on 01/27/2019), Disp: 30 tablet, Rfl: 1   Allergies  Allergen Reactions   Shellfish Allergy Anaphylaxis     Family History  Problem Relation Age of Onset   Cancer Mother    Cancer Father    Prostate cancer Father  Kidney disease Brother    Kidney failure Brother      Social History   Socioeconomic History   Marital status: Single    Spouse name: Not on file   Number of children: 3   Years of education: Not on file   Highest education level: Not on file  Occupational History   Not on file  Social Needs   Financial resource strain: Not hard at all   Food insecurity    Worry: Never true    Inability: Never true   Transportation needs    Medical: No    Non-medical: No  Tobacco Use   Smoking status: Light Tobacco Smoker    Packs/day: 0.30    Years: 42.00     Pack years: 12.60    Types: Cigarettes   Smokeless tobacco: Never Used   Tobacco comment: patient stated he doesn't smoke enough to quit  Substance and Sexual Activity   Alcohol use: Yes    Alcohol/week: 0.0 standard drinks    Comment: during special events and games   Drug use: No   Sexual activity: Yes    Partners: Female  Lifestyle   Physical activity    Days per week: 0 days    Minutes per session: 0 min   Stress: Not at all  Relationships   Social connections    Talks on phone: Once a week    Gets together: Once a week    Attends religious service: 1 to 4 times per year    Active member of club or organization: No    Attends meetings of clubs or organizations: Never    Relationship status: Never married   Intimate partner violence    Fear of current or ex partner: No    Emotionally abused: No    Physically abused: No    Forced sexual activity: No  Other Topics Concern   Not on file  Social History Narrative   Not on file    I personally reviewed active problem list, medication list, allergies, family history, social history, health maintenance, notes from last encounter, lab results, imaging with the patient/caregiver today.  Review of Systems  Constitutional: Negative.  Negative for fever.  HENT: Negative.   Eyes: Negative.  Negative for photophobia.  Respiratory: Negative.   Cardiovascular: Negative.  Negative for chest pain.  Gastrointestinal: Negative.   Endocrine: Negative.   Genitourinary: Negative.   Musculoskeletal: Positive for neck pain.  Skin: Negative.   Allergic/Immunologic: Negative.   Neurological: Positive for headaches. Negative for tingling, weakness and numbness.  Hematological: Negative.   Psychiatric/Behavioral: Negative.   All other systems reviewed and are negative.      Objective:   Vitals:   01/27/19 1504  BP: (!) 142/64  Pulse: 93  Resp: 14  Temp: 98.3 F (36.8 C)  SpO2: 99%  Weight: 220 lb 9.6 oz (100.1 kg)    Height: 6' (1.829 m)    Body mass index is 29.92 kg/m.  Physical Exam Vitals signs and nursing note reviewed.  Constitutional:      General: He is not in acute distress.    Appearance: Normal appearance. He is well-developed. He is obese. He is not ill-appearing, toxic-appearing or diaphoretic.     Interventions: Face mask in place.  HENT:     Head: Normocephalic and atraumatic.     Jaw: No trismus.     Right Ear: External ear normal.     Left Ear: External ear normal.  Eyes:  General: Lids are normal. No scleral icterus.    Conjunctiva/sclera: Conjunctivae normal.     Pupils: Pupils are equal, round, and reactive to light.  Neck:     Musculoskeletal: Neck supple. Decreased range of motion. Spinous process tenderness and muscular tenderness present. No edema, erythema or crepitus.     Thyroid: No thyromegaly.     Trachea: Trachea and phonation normal. No tracheal deviation.      Comments: Increased muscle tension to left cervical paraspinal muscles and cervical trapezious Limited rotation to the left <40 degrees Rotation to the right beyond 45 degrees (roughly 50-55) No crepitus No fasciculations 5/5 B/L UE grip strength Normal sensation to light touch in B/L UE in all nerve distributions Cardiovascular:     Rate and Rhythm: Normal rate and regular rhythm.     Pulses: Normal pulses.          Radial pulses are 2+ on the right side and 2+ on the left side.       Posterior tibial pulses are 2+ on the right side and 2+ on the left side.     Heart sounds: Normal heart sounds. No murmur. No friction rub. No gallop.   Pulmonary:     Effort: Pulmonary effort is normal. No respiratory distress.     Breath sounds: Normal breath sounds. No stridor. No wheezing, rhonchi or rales.  Abdominal:     General: Bowel sounds are normal. There is no distension.     Palpations: Abdomen is soft.     Tenderness: There is no abdominal tenderness. There is no guarding or rebound.   Musculoskeletal:     Left shoulder: He exhibits normal range of motion, no bony tenderness, no swelling, no effusion, no crepitus, no deformity, no pain, no spasm, normal pulse and normal strength.     Right lower leg: No edema.     Left lower leg: No edema.  Lymphadenopathy:     Cervical: No cervical adenopathy.  Skin:    General: Skin is warm and dry.     Capillary Refill: Capillary refill takes less than 2 seconds.     Coloration: Skin is not jaundiced.     Findings: No rash.     Nails: There is no clubbing.   Neurological:     Mental Status: He is alert.     Cranial Nerves: No dysarthria or facial asymmetry.     Motor: No tremor or abnormal muscle tone.     Gait: Gait normal.  Psychiatric:        Mood and Affect: Mood normal.        Speech: Speech normal.        Behavior: Behavior normal. Behavior is cooperative.      Results for orders placed or performed in visit on 01/22/19  Novel Coronavirus, NAA ([facility])   Specimen: Oropharyngeal(OP) collection in vial transport medium   OROPHARYNGEA  TESTING  Result Value Ref Range   SARS-CoV-2, NAA Not Detected Not Detected        Assessment & Plan:      ICD-10-CM   1. Spasm of cervical paraspinous muscle  M62.838 DG Cervical Spine Complete    Ambulatory referral to Physical Therapy    ketorolac (TORADOL) injection 30 mg    DISCONTINUED: ketorolac (TORADOL) 30 MG/ML injection 30 mg  2. Strain of cervical portion of trapezius muscle  S16.1XXA DG Cervical Spine Complete    Ambulatory referral to Physical Therapy    ketorolac (TORADOL) injection 30 mg  DISCONTINUED: ketorolac (TORADOL) 30 MG/ML injection 30 mg  3. Neck pain  M54.2 DG Cervical Spine Complete    Ambulatory referral to Physical Therapy    ketorolac (TORADOL) injection 30 mg    DISCONTINUED: ketorolac (TORADOL) 30 MG/ML injection 30 mg  4. Decreased ROM of neck  R29.898 DG Cervical Spine Complete    Ambulatory referral to Physical Therapy    ketorolac  (TORADOL) injection 30 mg    DISCONTINUED: ketorolac (TORADOL) 30 MG/ML injection 30 mg      Patient was 6 weeks of left posterior neck pain radiates from left paraspinal muscles cervical spine from base of skull to the left shoulder along the left upper trapezius with some visible increased muscle tension slightly asymmetric when compared to the right some very mild midline tenderness to palpation but no step-off.  Rotation of the neck is decreased to the left and greater than 45 degrees to the right he has no signs of cervical radiculopathy with normal strength bilaterally normal sensation and he denies any radiation of pain denies any paresthesias in his arms.  No injury but pain has gradually worsened over 3 weeks.  He has taken steroids, Mobic, muscle relaxers and then heating pad to help him get to sleep.  He has not done any gentle stretching, although he was given exercises that were reviewed thoroughly by Paul Ingram advised to do.    We will get an x-ray to check his alignment and to look for DDD I have advised frequent heat therapy, gentle massage, referred him to physical therapy he was encouraged to do the home exercises with gentle stretching to help loosen up the muscle tension in his neck and changed his muscle relaxer from tizanidine to Flexeril, given a Toradol shot here in clinic, and increase Mobic from 7.5 mg daily to 15 mg daily.  Encourage patient to follow-up in the next 1 to 2 weeks and especially when starting PT if he has had not tolerating physical therapy or if any of his symptoms have worsened and would at that time refer him to a specialist.  Delsa Grana, PA-C 01/27/19 3:25 PM

## 2019-01-27 NOTE — Patient Instructions (Signed)
Do heat therapy very often  Work on gentle stretching and massage.  You will be contacted to start physical therapy.  If your neck pain does not improve in the next 2-3 weeks, let me know so we can refer you to a spine specialist.   Heat Therapy Heat therapy can help ease sore, stiff, injured, and tight muscles and joints. Heat relaxes your muscles, which may help ease your pain and muscle spasms. Do not use heat therapy unless your doctor tells you to use it. How to use heat therapy There are several different kinds of heat therapy, including:  Moist heat pack.  Hot water bottle.  Electric heating pad.  Heated gel pack.  Heated wrap.  Warm water bath. Your doctor will tell you how to use heat therapy. In general, you should: 1. Place a towel between your skin and the heat source. 2. Leave the heat on for 20-30 minutes. Your skin may turn pink. 3. Remove the heat if your skin turns bright red. You should remove the heat source if you are unable to feel pain, heat, or cold. You are more likely to get burned if you leave it on the skin for too long. Your doctor may also tell you to take a warm water bath. To do this: 1. Put a non-slip pad in the bathtub to prevent a fall. 2. Fill the bathtub with warm water. 3. Check the water temperature. 4. Soak in the water for 15-20 minutes, or as told by your doctor. 5. Be careful when you stand up after the bath. You may feel dizzy. 6. Pat yourself dry after the bath. Do not rub your skin to dry it. General recommendations for heat therapy  Do not sleep while using heat therapy. Only use heat therapy while you are awake.  Your skin may turn pink while using heat therapy. Do not use heat therapy if your skin turns red.  Do not use heat therapy if you have a new injury.  High heat or using heat for a long time can cause burns. Be careful not to burn your skin when using heat therapy.  Do not use heat therapy on areas of your skin that  are already irritated, such as with a rash or sunburn. Get help if you have:  Blisters, redness, swelling (puffiness), or numbness.  New pain.  Pain that is getting worse. Summary  Heat therapy is the use of heat to help ease sore, stiff, injured, and tight muscles and joints.  There are different types of heat therapy. Your doctor will tell you which one to use.  Only use heat therapy while you are awake.  Watch your skin to make sure you do not get burned while using heat therapy. This information is not intended to replace advice given to you by your health care provider. Make sure you discuss any questions you have with your health care provider. Document Released: 06/05/2011 Document Revised: 05/06/2018 Document Reviewed: 03/24/2017 Elsevier Patient Education  2020 [employer].   Neck Exercises Ask your health care provider which exercises are safe for you. Do exercises exactly as told by your health care provider and adjust them as directed. It is normal to feel mild stretching, pulling, tightness, or discomfort as you do these exercises. Stop right away if you feel sudden pain or your pain gets worse. Do not begin these exercises until told by your health care provider. Neck exercises can be important for many reasons. They can improve strength  and maintain flexibility in your neck, which will help your upper back and prevent neck pain. Stretching exercises Rotation neck stretching  1. Sit in a chair or stand up. 2. Place your feet flat on the floor, shoulder width apart. 3. Slowly turn your head (rotate) to the right until a slight stretch is felt. Turn it all the way to the right so you can look over your right shoulder. Do not tilt or tip your head. 4. Hold this position for 10-30 seconds. 5. Slowly turn your head (rotate) to the left until a slight stretch is felt. Turn it all the way to the left so you can look over your left shoulder. Do not tilt or tip your  head. 6. Hold this position for 10-30 seconds. Repeat __________ times. Complete this exercise __________ times a day. Neck retraction 1. Sit in a sturdy chair or stand up. 2. Look straight ahead. Do not bend your neck. 3. Use your fingers to push your chin backward (retraction). Do not bend your neck for this movement. Continue to face straight ahead. If you are doing the exercise properly, you will feel a slight sensation in your throat and a stretch at the back of your neck. 4. Hold the stretch for 1-2 seconds. Repeat __________ times. Complete this exercise __________ times a day. Strengthening exercises Neck press 1. Lie on your back on a firm bed or on the floor with a pillow under your head. 2. Use your neck muscles to push your head down on the pillow and straighten your spine. 3. Hold the position as well as you can. Keep your head facing up (in a neutral position) and your chin tucked. 4. Slowly count to 5 while holding this position. Repeat __________ times. Complete this exercise __________ times a day. Isometrics These are exercises in which you strengthen the muscles in your neck while keeping your neck still (isometrics). 1. Sit in a supportive chair and place your hand on your forehead. 2. Keep your head and face facing straight ahead. Do not flex or extend your neck while doing isometrics. 3. Push forward with your head and neck while pushing back with your hand. Hold for 10 seconds. 4. Do the sequence again, this time putting your hand against the back of your head. Use your head and neck to push backward against the hand pressure. 5. Finally, do the same exercise on either side of your head, pushing sideways against the pressure of your hand. Repeat __________ times. Complete this exercise __________ times a day. Prone head lifts 1. Lie face-down (prone position), resting on your elbows so that your chest and upper back are raised. 2. Start with your head facing downward,  near your chest. Position your chin either on or near your chest. 3. Slowly lift your head upward. Lift until you are looking straight ahead. Then continue lifting your head as far back as you can comfortably stretch. 4. Hold your head up for 5 seconds. Then slowly lower it to your starting position. Repeat __________ times. Complete this exercise __________ times a day. Supine head lifts 1. Lie on your back (supine position), bending your knees to point to the ceiling and keeping your feet flat on the floor. 2. Lift your head slowly off the floor, raising your chin toward your chest. 3. Hold for 5 seconds. Repeat __________ times. Complete this exercise __________ times a day. Scapular retraction 1. Stand with your arms at your sides. Look straight ahead. 2. Slowly pull both  shoulders (scapulae) backward and downward (retraction) until you feel a stretch between your shoulder blades in your upper back. 3. Hold for 10-30 seconds. 4. Relax and repeat. Repeat __________ times. Complete this exercise __________ times a day. Contact a health care provider if:  Your neck pain or discomfort gets much worse when you do an exercise.  Your neck pain or discomfort does not improve within 2 hours after you exercise. If you have any of these problems, stop exercising right away. Do not do the exercises again unless your health care provider says that you can. Get help right away if:  You develop sudden, severe neck pain. If this happens, stop exercising right away. Do not do the exercises again unless your health care provider says that you can. This information is not intended to replace advice given to you by your health care provider. Make sure you discuss any questions you have with your health care provider. Document Released: 02/22/2015 Document Revised: 01/09/2018 Document Reviewed: 01/09/2018 Elsevier Patient Education  2020 [employer].

## 2019-01-28 ENCOUNTER — Other Ambulatory Visit: Payer: Self-pay | Admitting: Family Medicine

## 2019-01-28 DIAGNOSIS — R29898 Other symptoms and signs involving the musculoskeletal system: Secondary | ICD-10-CM

## 2019-01-28 DIAGNOSIS — M542 Cervicalgia: Secondary | ICD-10-CM

## 2019-01-28 DIAGNOSIS — M5030 Other cervical disc degeneration, unspecified cervical region: Secondary | ICD-10-CM

## 2019-02-06 ENCOUNTER — Emergency Department: Payer: BC Managed Care – PPO

## 2019-02-06 ENCOUNTER — Inpatient Hospital Stay
Admission: EM | Admit: 2019-02-06 | Discharge: 2019-02-09 | DRG: 378 | Disposition: A | Discharge status: Home / Self Care | Payer: BC Managed Care – PPO | Attending: Internal Medicine | Admitting: Internal Medicine

## 2019-02-06 ENCOUNTER — Other Ambulatory Visit: Payer: Self-pay

## 2019-02-06 ENCOUNTER — Encounter: Payer: Self-pay | Admitting: Emergency Medicine

## 2019-02-06 DIAGNOSIS — R0602 Shortness of breath: Secondary | ICD-10-CM | POA: Diagnosis not present

## 2019-02-06 DIAGNOSIS — R7303 Prediabetes: Secondary | ICD-10-CM | POA: Diagnosis present

## 2019-02-06 DIAGNOSIS — Z6832 Body mass index (BMI) 32.0-32.9, adult: Secondary | ICD-10-CM | POA: Diagnosis not present

## 2019-02-06 DIAGNOSIS — Z791 Long term (current) use of non-steroidal anti-inflammatories (NSAID): Secondary | ICD-10-CM | POA: Diagnosis not present

## 2019-02-06 DIAGNOSIS — E782 Mixed hyperlipidemia: Secondary | ICD-10-CM | POA: Diagnosis present

## 2019-02-06 DIAGNOSIS — I361 Nonrheumatic tricuspid (valve) insufficiency: Secondary | ICD-10-CM | POA: Diagnosis not present

## 2019-02-06 DIAGNOSIS — R079 Chest pain, unspecified: Secondary | ICD-10-CM | POA: Diagnosis not present

## 2019-02-06 DIAGNOSIS — K922 Gastrointestinal hemorrhage, unspecified: Secondary | ICD-10-CM

## 2019-02-06 DIAGNOSIS — D500 Iron deficiency anemia secondary to blood loss (chronic): Secondary | ICD-10-CM | POA: Diagnosis not present

## 2019-02-06 DIAGNOSIS — E781 Pure hyperglyceridemia: Secondary | ICD-10-CM | POA: Diagnosis present

## 2019-02-06 DIAGNOSIS — J449 Chronic obstructive pulmonary disease, unspecified: Secondary | ICD-10-CM | POA: Diagnosis not present

## 2019-02-06 DIAGNOSIS — F17210 Nicotine dependence, cigarettes, uncomplicated: Secondary | ICD-10-CM | POA: Diagnosis not present

## 2019-02-06 DIAGNOSIS — Z91013 Allergy to seafood: Secondary | ICD-10-CM | POA: Diagnosis not present

## 2019-02-06 DIAGNOSIS — K921 Melena: Secondary | ICD-10-CM

## 2019-02-06 DIAGNOSIS — K269 Duodenal ulcer, unspecified as acute or chronic, without hemorrhage or perforation: Secondary | ICD-10-CM | POA: Diagnosis not present

## 2019-02-06 DIAGNOSIS — E669 Obesity, unspecified: Secondary | ICD-10-CM | POA: Diagnosis not present

## 2019-02-06 DIAGNOSIS — K264 Chronic or unspecified duodenal ulcer with hemorrhage: Principal | ICD-10-CM | POA: Diagnosis present

## 2019-02-06 DIAGNOSIS — Z79899 Other long term (current) drug therapy: Secondary | ICD-10-CM

## 2019-02-06 DIAGNOSIS — E785 Hyperlipidemia, unspecified: Secondary | ICD-10-CM | POA: Diagnosis present

## 2019-02-06 DIAGNOSIS — D62 Acute posthemorrhagic anemia: Secondary | ICD-10-CM | POA: Diagnosis not present

## 2019-02-06 DIAGNOSIS — Z87892 Personal history of anaphylaxis: Secondary | ICD-10-CM | POA: Diagnosis not present

## 2019-02-06 DIAGNOSIS — Z20828 Contact with and (suspected) exposure to other viral communicable diseases: Secondary | ICD-10-CM | POA: Diagnosis not present

## 2019-02-06 DIAGNOSIS — I10 Essential (primary) hypertension: Secondary | ICD-10-CM | POA: Diagnosis not present

## 2019-02-06 DIAGNOSIS — R0600 Dyspnea, unspecified: Secondary | ICD-10-CM | POA: Diagnosis not present

## 2019-02-06 DIAGNOSIS — I340 Nonrheumatic mitral (valve) insufficiency: Secondary | ICD-10-CM | POA: Diagnosis not present

## 2019-02-06 HISTORY — DX: Gastrointestinal hemorrhage, unspecified: K92.2

## 2019-02-06 LAB — VITAMIN B12: Vitamin B-12: 91 pg/mL — ABNORMAL LOW (ref 180–914)

## 2019-02-06 LAB — BASIC METABOLIC PANEL
Anion gap: 8 (ref 5–15)
BUN: 30 mg/dL — ABNORMAL HIGH (ref 8–23)
CO2: 21 mmol/L — ABNORMAL LOW (ref 22–32)
Calcium: 8.7 mg/dL — ABNORMAL LOW (ref 8.9–10.3)
Chloride: 110 mmol/L (ref 98–111)
Creatinine, Ser: 0.81 mg/dL (ref 0.61–1.24)
GFR calc Af Amer: >60 mL/min (ref >60)
GFR calc non Af Amer: >60 mL/min (ref >60)
Glucose, Bld: 166 mg/dL — ABNORMAL HIGH (ref 70–99)
Potassium: 4.1 mmol/L (ref 3.5–5.1)
Sodium: 139 mmol/L (ref 135–145)

## 2019-02-06 LAB — PATHOLOGIST SMEAR REVIEW

## 2019-02-06 LAB — PREPARE RBC (CROSSMATCH)

## 2019-02-06 LAB — IRON AND TIBC
Iron: 91 ug/dL (ref 45–182)
Saturation Ratios: 28 % (ref 17.9–39.5)
TIBC: 326 ug/dL (ref 250–450)
UIBC: 235 ug/dL

## 2019-02-06 LAB — TROPONIN I (HIGH SENSITIVITY)
Troponin I (High Sensitivity): 9 ng/L (ref <18)
Troponin I (High Sensitivity): 10 ng/L (ref <18)

## 2019-02-06 LAB — CBC
HCT: 23.2 % — ABNORMAL LOW (ref 39.0–52.0)
Hemoglobin: 7.3 g/dL — ABNORMAL LOW (ref 13.0–17.0)
MCH: 31.3 pg (ref 26.0–34.0)
MCHC: 31.5 g/dL (ref 30.0–36.0)
MCV: 99.6 fL (ref 80.0–100.0)
Platelets: 243 10*3/uL (ref 150–400)
RBC: 2.33 MIL/uL — ABNORMAL LOW (ref 4.22–5.81)
RDW: 14.7 % (ref 11.5–15.5)
WBC: 13.1 10*3/uL — ABNORMAL HIGH (ref 4.0–10.5)
nRBC: 5 % — ABNORMAL HIGH (ref 0.0–0.2)

## 2019-02-06 LAB — PROTIME-INR
INR: 1.2 (ref 0.8–1.2)
Prothrombin Time: 15 seconds (ref 11.4–15.2)

## 2019-02-06 LAB — FOLATE: Folate: 11.3 ng/mL (ref >5.9)

## 2019-02-06 LAB — APTT: aPTT: 27 seconds (ref 24–36)

## 2019-02-06 LAB — ABO/RH: ABO/RH(D): A POS

## 2019-02-06 LAB — SARS CORONAVIRUS 2 (TAT 6-24 HRS): SARS Coronavirus 2: NEGATIVE

## 2019-02-06 MED ORDER — CYCLOBENZAPRINE HCL 10 MG PO TABS: 5.0000 mg | ORAL_TABLET | Freq: Three times a day (TID) | ORAL | Status: DC | PRN

## 2019-02-06 MED ORDER — ONDANSETRON HCL 4 MG/2ML IJ SOLN: 4.0000 mg | Freq: Four times a day (QID) | INTRAMUSCULAR | Status: DC | PRN

## 2019-02-06 MED ORDER — SODIUM CHLORIDE 0.9% FLUSH
3.0000 mL | Freq: Two times a day (BID) | INTRAVENOUS | Status: DC
  Administered 2019-02-06 – 2019-02-09 (×4): 3 mL via INTRAVENOUS

## 2019-02-06 MED ORDER — ONDANSETRON HCL 4 MG PO TABS: 4.0000 mg | ORAL_TABLET | Freq: Four times a day (QID) | ORAL | Status: DC | PRN

## 2019-02-06 MED ORDER — ACETAMINOPHEN 650 MG RE SUPP: 650.0000 mg | Freq: Four times a day (QID) | RECTAL | Status: DC | PRN

## 2019-02-06 MED ORDER — SODIUM CHLORIDE 0.9 % IV SOLN
10.0000 mL/h | Freq: Once | INTRAVENOUS | Status: AC
  Administered 2019-02-06: 10 mL/h via INTRAVENOUS

## 2019-02-06 MED ORDER — ACETAMINOPHEN 325 MG PO TABS
650.0000 mg | ORAL_TABLET | Freq: Four times a day (QID) | ORAL | Status: DC | PRN
  Administered 2019-02-08: 08:00:00 650 mg via ORAL
  Filled 2019-02-06: qty 2

## 2019-02-06 MED ORDER — SODIUM CHLORIDE 0.9 % IV SOLN
INTRAVENOUS | Status: DC
  Administered 2019-02-06 – 2019-02-09 (×4): via INTRAVENOUS

## 2019-02-06 MED ORDER — SODIUM CHLORIDE 0.9% FLUSH: 3.0000 mL | Freq: Once | INTRAVENOUS | Status: DC

## 2019-02-06 MED ORDER — PANTOPRAZOLE SODIUM 40 MG IV SOLR
40.0000 mg | Freq: Once | INTRAVENOUS | Status: AC
  Administered 2019-02-06: 40 mg via INTRAVENOUS
  Filled 2019-02-06: qty 40

## 2019-02-06 MED ORDER — PANTOPRAZOLE SODIUM 40 MG IV SOLR
40.0000 mg | Freq: Two times a day (BID) | INTRAVENOUS | Status: DC
  Administered 2019-02-06 – 2019-02-09 (×6): 40 mg via INTRAVENOUS
  Filled 2019-02-06 (×8): qty 40

## 2019-02-06 MED ORDER — SODIUM CHLORIDE 0.9% IV SOLUTION
Freq: Once | INTRAVENOUS | Status: AC
  Administered 2019-02-06: 19:00:00 via INTRAVENOUS
  Filled 2019-02-06: qty 250

## 2019-02-06 NOTE — ED Notes (Signed)
Pt reports chest pain this morning, describes the chest pain as heavy in the middle of his chest. Denies SHOB/ nausea. Reports feeling fatigued.

## 2019-02-06 NOTE — ED Notes (Signed)
Lab called and informed this RN that patient has two additional units of blood ready. Pt currently still infusing first unit of blood.

## 2019-02-06 NOTE — ED Notes (Signed)
Pt ambulated to bathroom 

## 2019-02-06 NOTE — ED Notes (Signed)
EDP at bedside  

## 2019-02-06 NOTE — ED Notes (Signed)
Consent signed and in pt's chart
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
## 2019-02-06 NOTE — ED Triage Notes (Signed)
Awakened with chst pain about 2 am.  He did go back to sleep.  Says it came back about 530am when he got up.  Says he was breathing rapid, had dizziness and felt like heart going fast.  Says he does not think he was in a sweat.  Denies nausea

## 2019-02-06 NOTE — ED Notes (Signed)
Family at bedside. 
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
## 2019-02-06 NOTE — ED Notes (Signed)
Sent down another rainbow and SST tube to lab

## 2019-02-07 ENCOUNTER — Inpatient Hospital Stay: Payer: BC Managed Care – PPO | Admitting: Certified Registered Nurse Anesthetist

## 2019-02-07 ENCOUNTER — Encounter
Admission: EM | Disposition: A | Discharge status: Home / Self Care | Payer: Self-pay | Source: Home / Self Care | Attending: Internal Medicine

## 2019-02-07 DIAGNOSIS — K922 Gastrointestinal hemorrhage, unspecified: Secondary | ICD-10-CM

## 2019-02-07 DIAGNOSIS — Z791 Long term (current) use of non-steroidal anti-inflammatories (NSAID): Secondary | ICD-10-CM

## 2019-02-07 DIAGNOSIS — I10 Essential (primary) hypertension: Secondary | ICD-10-CM

## 2019-02-07 DIAGNOSIS — E782 Mixed hyperlipidemia: Secondary | ICD-10-CM

## 2019-02-07 HISTORY — PX: ESOPHAGOGASTRODUODENOSCOPY (EGD) WITH PROPOFOL: SHX5813

## 2019-02-07 LAB — COMPREHENSIVE METABOLIC PANEL
ALT: 25 U/L (ref 0–44)
AST: 17 U/L (ref 15–41)
Albumin: 3 g/dL — ABNORMAL LOW (ref 3.5–5.0)
Alkaline Phosphatase: 32 U/L — ABNORMAL LOW (ref 38–126)
Anion gap: 7 (ref 5–15)
BUN: 20 mg/dL (ref 8–23)
CO2: 23 mmol/L (ref 22–32)
Calcium: 8.2 mg/dL — ABNORMAL LOW (ref 8.9–10.3)
Chloride: 113 mmol/L — ABNORMAL HIGH (ref 98–111)
Creatinine, Ser: 0.69 mg/dL (ref 0.61–1.24)
GFR calc Af Amer: >60 mL/min (ref >60)
GFR calc non Af Amer: >60 mL/min (ref >60)
Glucose, Bld: 125 mg/dL — ABNORMAL HIGH (ref 70–99)
Potassium: 3.8 mmol/L (ref 3.5–5.1)
Sodium: 143 mmol/L (ref 135–145)
Total Bilirubin: 0.7 mg/dL (ref 0.3–1.2)
Total Protein: 5.3 g/dL — ABNORMAL LOW (ref 6.5–8.1)

## 2019-02-07 LAB — CBC
HCT: 24.8 % — ABNORMAL LOW (ref 39.0–52.0)
Hemoglobin: 8.4 g/dL — ABNORMAL LOW (ref 13.0–17.0)
MCH: 31.7 pg (ref 26.0–34.0)
MCHC: 33.9 g/dL (ref 30.0–36.0)
MCV: 93.6 fL (ref 80.0–100.0)
Platelets: 193 10*3/uL (ref 150–400)
RBC: 2.65 MIL/uL — ABNORMAL LOW (ref 4.22–5.81)
RDW: 15.5 % (ref 11.5–15.5)
WBC: 14.2 10*3/uL — ABNORMAL HIGH (ref 4.0–10.5)
nRBC: 2.7 % — ABNORMAL HIGH (ref 0.0–0.2)

## 2019-02-07 SURGERY — ESOPHAGOGASTRODUODENOSCOPY (EGD) WITH PROPOFOL
Anesthesia: General

## 2019-02-07 MED ORDER — PROPOFOL 500 MG/50ML IV EMUL
INTRAVENOUS | Status: DC | PRN
  Administered 2019-02-07: 150 ug/kg/min via INTRAVENOUS

## 2019-02-07 MED ORDER — SODIUM CHLORIDE 0.9 % IV SOLN
INTRAVENOUS | Status: DC
  Administered 2019-02-07: 11:00:00 via INTRAVENOUS

## 2019-02-07 MED ORDER — LIDOCAINE HCL (CARDIAC) PF 100 MG/5ML IV SOSY
PREFILLED_SYRINGE | INTRAVENOUS | Status: DC | PRN
  Administered 2019-02-07: 50 mg via INTRAVENOUS

## 2019-02-07 MED ORDER — CYANOCOBALAMIN 1000 MCG/ML IJ SOLN
1000.0000 ug | Freq: Once | INTRAMUSCULAR | Status: AC
  Administered 2019-02-07: 1000 ug via INTRAMUSCULAR
  Filled 2019-02-07: qty 1

## 2019-02-07 MED ORDER — PROPOFOL 10 MG/ML IV BOLUS
INTRAVENOUS | Status: DC | PRN
  Administered 2019-02-07: 50 mg via INTRAVENOUS
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
## 2019-02-07 NOTE — Anesthesia Post-op Follow-up Note (Signed)
Anesthesia QCDR form completed.        

## 2019-02-07 NOTE — Transfer of Care (Signed)
Immediate Anesthesia Transfer of Care Note  Patient: Paul Ingram  Procedure(s) Performed: ESOPHAGOGASTRODUODENOSCOPY (EGD) WITH PROPOFOL (N/A )  Patient Location: PACU  Anesthesia Type:General  Level of Consciousness: awake, alert  and oriented  Airway & Oxygen Therapy: Patient Spontanous Breathing and Patient connected to face mask oxygen  Post-op Assessment: Report given to RN and Post -op Vital signs reviewed and stable  Post vital signs: Reviewed and stable  Last Vitals:  Vitals Value Taken Time  BP    Temp    Pulse 86 02/07/19 1059  Resp 12 02/07/19 1059  SpO2 100 % 02/07/19 1059  Vitals shown include unvalidated device data.  Last Pain:  Vitals:   02/07/19 1030  TempSrc: Temporal  PainSc: 2          Complications: No apparent anesthesia complications
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
## 2019-02-07 NOTE — Op Note (Signed)
[facility] Patient Name: Paul Ingram Procedure Date: 02/07/2019 10:13 AM MRN: [id number] Account #: [id number] Date of Birth: 1955/08/23 Admit Type: Inpatient Age: 63 Room: [facility] ENDO ROOM 2 Gender: Male Note Status: Finalized Procedure:             Upper GI endoscopy Indications:           Melena Providers:             Jonathon Bellows MD, MD Referring MD:          Valda Lamb, lacman.,MD Medicines:             Monitored Anesthesia Care Complications:         No immediate complications. Procedure:             Pre-Anesthesia Assessment:                        - Prior to the procedure, a History and Physical was                         performed, and patient medications, allergies and                         sensitivities were reviewed. The patient's tolerance                         of previous anesthesia was reviewed.                        - The risks and benefits of the procedure and the                         sedation options and risks were discussed with the                         patient. All questions were answered and informed                         consent was obtained.                        - ASA Grade Assessment: II - A patient with mild                         systemic disease.                        After obtaining informed consent, the endoscope was                         passed under direct vision. Throughout the procedure,                         the patient's blood pressure, pulse, and oxygen                         saturations were monitored continuously. The Endoscope                         was introduced through the mouth, and advanced to the  third part of duodenum. The upper GI endoscopy was                         accomplished with ease. The patient tolerated the                         procedure well. Findings:      The esophagus was normal.      The stomach was normal.      The cardia and gastric  fundus were normal on retroflexion.      Two non-bleeding cratered duodenal ulcers with a clean ulcer base       (Forrest Class III) were found in the first portion of the duodenum. The       largest lesion was 8 mm in largest dimension. Impression:            - Normal esophagus.                        - Normal stomach.                        - Non-bleeding duodenal ulcers with a clean ulcer base                         (Forrest Class III).                        - No specimens collected. Recommendation:        - Return patient to hospital ward for ongoing care.                        - Clear liquid diet today.                        - Continue present medications.                        - Stop NSAID                        check H pylori stool antigen                        Continue PPI for 6 weeks Procedure Code(s):     --- Professional ---                        [phone], Esophagogastroduodenoscopy, flexible,                         transoral; diagnostic, including collection of                         specimen(s) by brushing or washing, when performed                         (separate procedure) Diagnosis Code(s):     --- Professional ---                        K26.9, Duodenal ulcer, unspecified as acute or  chronic, without hemorrhage or perforation                        K92.1, Melena (includes Hematochezia) CPT copyright 2019 American Medical Association. All rights reserved. The codes documented in this report are preliminary and upon coder review may  be revised to meet current compliance requirements. Jonathon Bellows, MD Jonathon Bellows MD, MD 02/07/2019 10:56:17 AM This report has been signed electronically. Number of Addenda: 0 Note Initiated On: 02/07/2019 10:13 AM Estimated Blood Loss:  Estimated blood loss: none.      [facility]

## 2019-02-07 NOTE — Progress Notes (Signed)
PROGRESS NOTE  Paul Ingram [id number] DOB: Feb 02, 1956 DOA: 02/06/2019 PCP: Delsa Grana, PA-C   LOS: 1 day   Brief Narrative / Interim history: 63 y.o. male history of hyperlipidemia, hypertension, borderline diabetes and obesity presented to hospital with complaints of chest tightness and shortness of breath for 1 day.  Patient stated that his breathing got better when he was sitting up.  There was no mention of fever, chills or rigor. Patient reported that he has been having some dark stools for the last 1 week.   Patient stated that he had been having some shoulder pain and was taking NSAIDs as prescribed by his primary care physician.  He denies any epigastric pain, hematemesis,  melena nausea or vomiting.  Patient denies recent travel or sick contact..  States that he drinks beer a day or so and smokes almost half a pack a day.  Patient denies any syncope, but has been feeling some dizziness and lightheadedness.  Patient denies any urinary urgency, frequency or dysuria.  Subjective / 24h Interval events: Feeling better after blood transfusions, feeling stronger. No chest pain, no shortness of breath, no abdominal pain, nausea / vomiting   Assessment & Plan: Principal Problem Acute blood loss anemia with melena likely due to upper GI bleed -Possibly in setting of missed bleeding peptic ulcer, gastroenterology consulted and evaluated patient.  He underwent an EGD on 02/07/2019 which showed 2 nonbleeding duodenal ulcers in the first portion of the duodenum -Continue PPI, no clear liquid diet today and if tolerates advance tomorrow, discussed with Dr. Vicente Males  Active Problems Chest discomfort, dyspnea likely due to symptomatic anemia -2D echocardiogram pending.  Reassuringly his high-sensitivity troponins were negative x2.  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  Tylenol as needed.  Scheduled Meds: . pantoprazole (PROTONIX) IV  40 mg Intravenous Q12H  . sodium chloride flush  3 mL Intravenous Once  . sodium chloride flush  3 mL Intravenous Q12H   Continuous Infusions: . sodium chloride 125 mL/hr at 02/07/19 0500   PRN Meds:.acetaminophen **OR** acetaminophen, cyclobenzaprine, ondansetron **OR** ondansetron (ZOFRAN) IV  DVT prophylaxis: SCDs Code Status: Full code Family Communication: d/w patient  Disposition Plan: home in 24h if stable  Consultants:  GI  Procedures:  Findings:      The esophagus was normal.      The stomach was normal.      The cardia and gastric fundus were normal on retroflexion.      Two non-bleeding cratered duodenal ulcers with a clean ulcer base       (Forrest Class III) were found in the first portion of the duodenum. The       largest lesion was 8 mm in largest dimension. Impression:            - Normal esophagus.                        - Normal stomach.                        - Non-bleeding duodenal ulcers with a clean ulcer base                         (Forrest Class III).                        -  No specimens collected. Recommendation:        - Return patient to hospital ward for ongoing care.                        - Clear liquid diet today.                        - Continue present medications.                        - Stop NSAID                        check H pylori stool antigen                        Continue PPI for 6 weeks  Microbiology  None   Antimicrobials: None     Objective: Vitals:   02/07/19 1109 02/07/19 1119 02/07/19 1129 02/07/19 1139  BP: (!) 133/91 117/87 132/87 (!) 127/91  Pulse: 77 81 80 80  Resp: 16 20 18  (!) 23  Temp:      TempSrc:      SpO2: 100% 99% 98% 98%  Weight:      Height:        Intake/Output Summary (Last 24 hours) at 02/07/2019 1245 Last data filed at 02/07/2019 0500  Gross per 24 hour  Intake 2102.44 ml  Output -  Net 2102.44 ml   Filed Weights   02/06/19 1030 02/07/19 1030  Weight: 99.8 kg 99.8 kg    Examination:  Constitutional: NAD Eyes: lids and conjunctivae normal, no scleral icterus ENMT: Mucous membranes are moist.  Neck: normal, supple Respiratory: clear to auscultation bilaterally, no wheezing, no crackles. Normal respiratory effort. No accessory muscle use.  Cardiovascular: Regular rate and rhythm, no murmurs / rubs / gallops. No LE edema. Good peripheral pulses Abdomen: no tenderness. Bowel sounds positive.  Musculoskeletal: no clubbing / cyanosis.  Skin: no rashes Neurologic: CN 2-12 grossly intact. Strength 5/5 in all 4.  Psychiatric: Normal judgment and insight. Alert and oriented x 3. Normal mood.    Data Reviewed: I have independently reviewed following labs and imaging studies   CBC: Recent Labs  Lab 02/06/19 1032 02/07/19 0258  WBC 13.1* 14.2*  HGB 7.3* 8.4*  HCT 23.2* 24.8*  MCV 99.6 93.6  PLT 243 0000000   Basic Metabolic Panel: Recent Labs  Lab 02/06/19 1032 02/07/19 0258  NA 139 143  K 4.1 3.8  CL 110 113*  CO2 21* 23  GLUCOSE 166* 125*  BUN 30* 20  CREATININE 0.81 0.69  CALCIUM 8.7* 8.2*   Liver Function Tests: Recent Labs  Lab 02/07/19 0258  AST 17  ALT 25  ALKPHOS 32*  BILITOT 0.7  PROT 5.3*  ALBUMIN 3.0*   Coagulation Profile: Recent Labs  Lab 02/06/19 1333  INR 1.2   HbA1C: No results for input(s): HGBA1C in the last 72 hours. CBG: No results for input(s): GLUCAP in the last 168 hours.  Recent Results (from the past 240 hour(s))  SARS CORONAVIRUS 2 (TAT 6-24 HRS) Nasopharyngeal Nasopharyngeal Swab     Status: None   Collection Time: 02/06/19  1:07 PM   Specimen: Nasopharyngeal Swab  Result Value Ref Range Status   SARS Coronavirus 2 NEGATIVE NEGATIVE Final    Comment: (NOTE) SARS-CoV-2 target nucleic acids are NOT DETECTED. The SARS-CoV-2 RNA  is generally detectable in upper  and lower respiratory specimens during the acute phase of infection. Negative results do not preclude SARS-CoV-2 infection, do not rule out co-infections with other pathogens, and should not be used as the sole basis for treatment or other patient management decisions. Negative results must be combined with clinical observations, patient history, and epidemiological information. The expected result is Negative. Fact Sheet for Patients: SugarRoll.be Fact Sheet for Healthcare Providers: https://www.woods-mathews.com/ This test is not yet approved or cleared by the [place] FDA and  has been authorized for detection and/or diagnosis of SARS-CoV-2 by FDA under an Emergency Use Authorization (EUA). This EUA will remain  in effect (meaning this test can be used) for the duration of the COVID-19 declaration under Section 56 4(b)(1) of the Act, 21 U.S.C. section 360bbb-3(b)(1), unless the authorization is terminated or revoked sooner. Performed at [facility], [place] [address], [place], [place] [address]      Radiology Studies: No results found.  Marzetta Board, MD, PhD [facility]  Between 7 am - 7 pm I am available Contact me via Amion or Securechat  Between 7 pm - 7 am I am not available Contact night coverage MD/APP via [employer]

## 2019-02-07 NOTE — ED Notes (Signed)
Admitting MD in with pt    

## 2019-02-07 NOTE — Plan of Care (Signed)

## 2019-02-08 ENCOUNTER — Inpatient Hospital Stay (HOSPITAL_COMMUNITY)
Admit: 2019-02-08 | Discharge: 2019-02-08 | Disposition: A | Discharge status: Home / Self Care | Payer: BC Managed Care – PPO | Attending: Internal Medicine | Admitting: Internal Medicine

## 2019-02-08 DIAGNOSIS — D62 Acute posthemorrhagic anemia: Secondary | ICD-10-CM

## 2019-02-08 DIAGNOSIS — I361 Nonrheumatic tricuspid (valve) insufficiency: Secondary | ICD-10-CM

## 2019-02-08 DIAGNOSIS — K921 Melena: Secondary | ICD-10-CM

## 2019-02-08 DIAGNOSIS — I340 Nonrheumatic mitral (valve) insufficiency: Secondary | ICD-10-CM

## 2019-02-08 LAB — CBC
HCT: 22.1 % — ABNORMAL LOW (ref 39.0–52.0)
HCT: 25.3 % — ABNORMAL LOW (ref 39.0–52.0)
Hemoglobin: 7.5 g/dL — ABNORMAL LOW (ref 13.0–17.0)
Hemoglobin: 8.6 g/dL — ABNORMAL LOW (ref 13.0–17.0)
MCH: 32.2 pg (ref 26.0–34.0)
MCH: 31.3 pg (ref 26.0–34.0)
MCHC: 33.9 g/dL (ref 30.0–36.0)
MCHC: 34 g/dL (ref 30.0–36.0)
MCV: 94.8 fL (ref 80.0–100.0)
MCV: 92 fL (ref 80.0–100.0)
Platelets: 202 10*3/uL (ref 150–400)
Platelets: 202 10*3/uL (ref 150–400)
RBC: 2.33 MIL/uL — ABNORMAL LOW (ref 4.22–5.81)
RBC: 2.75 MIL/uL — ABNORMAL LOW (ref 4.22–5.81)
RDW: 16 % — ABNORMAL HIGH (ref 11.5–15.5)
RDW: 16.4 % — ABNORMAL HIGH (ref 11.5–15.5)
WBC: 10.6 10*3/uL — ABNORMAL HIGH (ref 4.0–10.5)
WBC: 9.9 10*3/uL (ref 4.0–10.5)
nRBC: 1.8 % — ABNORMAL HIGH (ref 0.0–0.2)
nRBC: 1.9 % — ABNORMAL HIGH (ref 0.0–0.2)

## 2019-02-08 LAB — ECHOCARDIOGRAM COMPLETE
Height: 69 in
Weight: 3541.47 oz

## 2019-02-08 LAB — BASIC METABOLIC PANEL
Anion gap: 5 (ref 5–15)
BUN: 12 mg/dL (ref 8–23)
CO2: 22 mmol/L (ref 22–32)
Calcium: 8 mg/dL — ABNORMAL LOW (ref 8.9–10.3)
Chloride: 112 mmol/L — ABNORMAL HIGH (ref 98–111)
Creatinine, Ser: 0.74 mg/dL (ref 0.61–1.24)
GFR calc Af Amer: >60 mL/min (ref >60)
GFR calc non Af Amer: >60 mL/min (ref >60)
Glucose, Bld: 106 mg/dL — ABNORMAL HIGH (ref 70–99)
Potassium: 3.9 mmol/L (ref 3.5–5.1)
Sodium: 139 mmol/L (ref 135–145)

## 2019-02-08 LAB — PREPARE RBC (CROSSMATCH)

## 2019-02-08 MED ORDER — SODIUM CHLORIDE 0.9% IV SOLUTION
Freq: Once | INTRAVENOUS | Status: AC
  Administered 2019-02-08: 12:00:00 via INTRAVENOUS

## 2019-02-08 NOTE — Progress Notes (Signed)
Vonda Antigua, MD [address], [place], [place], [place], [address] [address], [place], [place], [place], [address] Phone: [phone]  Fax: [phone]   Subjective: Pt reported a black stool last night. Remains hemodynamically stable. No red stool   Objective: Exam: Vital signs in last 24 hours: Vitals:   02/07/19 1619 02/07/19 2059 02/08/19 0423 02/08/19 0500  BP: 132/85 125/88 117/81   Pulse: 85 91 78   Resp:  19 18   Temp: 98.7 F (37.1 C) 99.1 F (37.3 C) 98.7 F (37.1 C)   TempSrc: Oral Oral Oral   SpO2: 100% 100% 99%   Weight:    100.4 kg  Height:       Weight change: -0.001 kg  Intake/Output Summary (Last 24 hours) at 02/08/2019 0940 Last data filed at 02/07/2019 2127 Gross per 24 hour  Intake 285.07 ml  Output -  Net 285.07 ml    General: No acute distress, AAO x3 Abd: Soft, NT/ND, No HSM Skin: Warm, no rashes Neck: Supple, Trachea midline   Lab Results: Lab Results  Component Value Date   WBC 10.6 (H) 02/08/2019   HGB 7.5 (L) 02/08/2019   HCT 22.1 (L) 02/08/2019   MCV 94.8 02/08/2019   PLT 202 02/08/2019   Micro Results: Recent Results (from the past 240 hour(s))  SARS CORONAVIRUS 2 (TAT 6-24 HRS) Nasopharyngeal Nasopharyngeal Swab     Status: None   Collection Time: 02/06/19  1:07 PM   Specimen: Nasopharyngeal Swab  Result Value Ref Range Status   SARS Coronavirus 2 NEGATIVE NEGATIVE Final    Comment: (NOTE) SARS-CoV-2 target nucleic acids are NOT DETECTED. The SARS-CoV-2 RNA is generally detectable in upper and lower respiratory specimens during the acute phase of infection. Negative results do not preclude SARS-CoV-2 infection, do not rule out co-infections with other pathogens, and should not be used as the sole basis for treatment or other patient management decisions. Negative results must be combined with clinical observations, patient history, and epidemiological information. The expected result is Negative.  Fact Sheet for Patients: SugarRoll.be Fact Sheet for Healthcare Providers: https://www.woods-mathews.com/ This test is not yet approved or cleared by the [place] FDA and  has been authorized for detection and/or diagnosis of SARS-CoV-2 by FDA under an Emergency Use Authorization (EUA). This EUA will remain  in effect (meaning this test can be used) for the duration of the COVID-19 declaration under Section 56 4(b)(1) of the Act, 21 U.S.C. section 360bbb-3(b)(1), unless the authorization is terminated or revoked sooner. Performed at [facility], [place] [address] [place], [place] [address]    Studies/Results: Dg Chest 2 View  Result Date: 02/06/2019 CLINICAL DATA:  Chest pain EXAM: CHEST - 2 VIEW COMPARISON:  None. FINDINGS: The heart size and mediastinal contours are within normal limits. Both lungs are clear. No pleural effusion. The visualized skeletal structures are unremarkable. IMPRESSION: No active cardiopulmonary disease. Electronically Signed   By: Macy Mis M.D.   On: 02/06/2019 10:58   Medications:  Scheduled Meds: . sodium chloride   Intravenous Once  . pantoprazole (PROTONIX) IV  40 mg Intravenous Q12H  . sodium chloride flush  3 mL Intravenous Once  . sodium chloride flush  3 mL Intravenous Q12H   Continuous Infusions: . sodium chloride 125 mL/hr at 02/08/19 0821   PRN Meds:.acetaminophen **OR** acetaminophen, cyclobenzaprine, ondansetron **OR** ondansetron (ZOFRAN) IV   Assessment: Principal Problem:   GIB (gastrointestinal bleeding) Active Problems:   Elevated cholesterol with elevated triglycerides  Hypertension goal BP (blood pressure) < 140/90   Acute blood loss anemia   GI bleed    Plan: Hemoglobin 7.5 this morning  Black stool overnight was likely clearing previous stool as pt has remained stable otherwise  Can consider repeat upper endoscopy if hemoglobin drops or other signs of  rebleeding occurs  PPI IV twice daily  Continue serial CBCs and transfuse PRN Avoid NSAIDs Maintain 2 large-bore IV lines Please page GI with any acute hemodynamic changes, or signs of active GI bleeding    LOS: 2 days   Vonda Antigua, MD 02/08/2019, 9:40 AM

## 2019-02-08 NOTE — Progress Notes (Signed)
PROGRESS NOTE  Paul Ingram [id number] DOB: Dec 19, 1955 DOA: 02/06/2019 PCP: Delsa Grana, PA-C   LOS: 2 days   Brief Narrative / Interim history: 63 y.o. male history of hyperlipidemia, hypertension, borderline diabetes and obesity presented to hospital with complaints of chest tightness and shortness of breath for 1 day.  Patient stated that his breathing got better when he was sitting up.  There was no mention of fever, chills or rigor. Patient reported that he has been having some dark stools for the last 1 week.   Patient stated that he had been having some shoulder pain and was taking NSAIDs as prescribed by his primary care physician.  He denies any epigastric pain, hematemesis,  melena nausea or vomiting.  Patient denies recent travel or sick contact..  States that he drinks beer a day or so and smokes almost half a pack a day.  Patient denies any syncope, but has been feeling some dizziness and lightheadedness.  Patient denies any urinary urgency, frequency or dysuria.  Subjective / 24h Interval events: He is feeling a little bit tired this morning.  Reports dark tarry bowel movement overnight  Assessment & Plan: Principal Problem Acute blood loss anemia with melena likely due to upper GI bleed -Gastroenterology consulted and evaluated patient, he underwent an EGD on 02/07/2019 which showed 2 nonbleeding duodenal ulcers in the first portion of the duodenum -He is tolerating diet without issues however had a dark melanotic bowel movement overnight.  His hemoglobin is trending down at 7.5 this morning.  He will receive another unit of packed red blood cell.  Patient reevaluated in the afternoon, he has had 1 additional black bowel movement -Continue to closely monitor, repeat CBC tomorrow morning  Active Problems Chest discomfort, dyspnea likely due to symptomatic anemia -High-sensitivity troponins negative -2D echo done today was overall unremarkable, normal EF 60 to 65%   Leukocytosis -Likely reactive, monitor, afebrile  Shoulder pain -Patient was on meloxicam and steroids as an outpatient, hold both.  Tylenol as needed.  Scheduled Meds: . pantoprazole (PROTONIX) IV  40 mg Intravenous Q12H  . sodium chloride flush  3 mL Intravenous Once  . sodium chloride flush  3 mL Intravenous Q12H   Continuous Infusions: . sodium chloride Stopped (02/08/19 1145)   PRN Meds:.acetaminophen **OR** acetaminophen, cyclobenzaprine, ondansetron **OR** ondansetron (ZOFRAN) IV  DVT prophylaxis: SCDs Code Status: Full code Family Communication: d/w patient  Disposition Plan: home when ready  Consultants:  GI  Procedures:  Findings:      The esophagus was normal.      The stomach was normal.      The cardia and gastric fundus were normal on retroflexion.      Two non-bleeding cratered duodenal ulcers with a clean ulcer base       (Forrest Class III) were found in the first portion of the duodenum. The       largest lesion was 8 mm in largest dimension. Impression:            - Normal esophagus.                        - Normal stomach.                        - Non-bleeding duodenal ulcers with a clean ulcer base                         (  Forrest Class III).                        - No specimens collected. Recommendation:        - Return patient to hospital ward for ongoing care.                        - Clear liquid diet today.                        - Continue present medications.                        - Stop NSAID                        check H pylori stool antigen                        Continue PPI for 6 weeks  2D echo IMPRESSIONS  1. Left ventricular ejection fraction, by visual estimation, is 60 to 65%. The left ventricle has normal function. There is mildly increased left ventricular hypertrophy.  2. Left ventricular diastolic parameters are indeterminate.  3. Global right ventricle has normal systolic function.The right ventricular size is normal. No  increase in right ventricular wall thickness.  4. Left atrial size was normal.  5. Right atrial size was normal.  6. The mitral valve is normal in structure. Mild mitral valve regurgitation. No evidence of mitral stenosis.  7. The tricuspid valve is normal in structure. Tricuspid valve regurgitation is mild.  8. The aortic valve is normal in structure. Aortic valve regurgitation is not visualized. Mild aortic valve sclerosis without stenosis.  9. The pulmonic valve was normal in structure. Pulmonic valve regurgitation is not visualized. 10. Moderately elevated pulmonary artery systolic pressure. 11. The tricuspid regurgitant velocity is 3.20 m/s, and with an assumed right atrial pressure of 5 mmHg, the estimated right ventricular systolic pressure is moderately elevated at 46.1 mmHg. 12. The inferior vena cava is normal in size with greater than 50% respiratory variability, suggesting right atrial pressure of 3 mmHg.  Microbiology  None   Antimicrobials: None     Objective: Vitals:   02/08/19 0500 02/08/19 1140 02/08/19 1200 02/08/19 1545  BP:  118/83 115/89 (!) 140/92  Pulse:  81 80 81  Resp:   18 18  Temp:  98.7 F (37.1 C) 98.9 F (37.2 C) 98.2 F (36.8 C)  TempSrc:  Oral Oral Oral  SpO2:  100% 100% 100%  Weight: 100.4 kg     Height:        Intake/Output Summary (Last 24 hours) at 02/08/2019 1750 Last data filed at 02/08/2019 1545 Gross per 24 hour  Intake 623 ml  Output 1 ml  Net 622 ml   Filed Weights   02/06/19 1030 02/07/19 1030 02/08/19 0500  Weight: 99.8 kg 99.8 kg 100.4 kg    Examination:  Constitutional: No distress Eyes: No scleral icterus ENMT: Moist mucous membranes Neck: normal, supple Respiratory: Clear bilaterally without wheezing or crackles, normal respiratory effort Cardiovascular: Regular rate and rhythm, no murmurs, no peripheral edema Abdomen: Soft, nontender, nondistended, positive bowel sounds Musculoskeletal: no clubbing / cyanosis.   Skin: No rashes seen Neurologic: No focal deficits, equal strength  Data Reviewed: I have independently reviewed following labs and imaging studies   CBC: Recent  Labs  Lab 02/06/19 1032 02/07/19 0258 02/08/19 0633  WBC 13.1* 14.2* 10.6*  HGB 7.3* 8.4* 7.5*  HCT 23.2* 24.8* 22.1*  MCV 99.6 93.6 94.8  PLT 243 193 [id number]   Basic Metabolic Panel: Recent Labs  Lab 02/06/19 1032 02/07/19 0258 02/08/19 0450  NA 139 143 139  K 4.1 3.8 3.9  CL 110 113* 112*  CO2 21* 23 22  GLUCOSE 166* 125* 106*  BUN 30* 20 12  CREATININE 0.81 0.69 0.74  CALCIUM 8.7* 8.2* 8.0*   Liver Function Tests: Recent Labs  Lab 02/07/19 0258  AST 17  ALT 25  ALKPHOS 32*  BILITOT 0.7  PROT 5.3*  ALBUMIN 3.0*   Coagulation Profile: Recent Labs  Lab 02/06/19 1333  INR 1.2   HbA1C: No results for input(s): HGBA1C in the last 72 hours. CBG: No results for input(s): GLUCAP in the last 168 hours.  Recent Results (from the past 240 hour(s))  SARS CORONAVIRUS 2 (TAT 6-24 HRS) Nasopharyngeal Nasopharyngeal Swab     Status: None   Collection Time: 02/06/19  1:07 PM   Specimen: Nasopharyngeal Swab  Result Value Ref Range Status   SARS Coronavirus 2 NEGATIVE NEGATIVE Final    Comment: (NOTE) SARS-CoV-2 target nucleic acids are NOT DETECTED. The SARS-CoV-2 RNA is generally detectable in upper and lower respiratory specimens during the acute phase of infection. Negative results do not preclude SARS-CoV-2 infection, do not rule out co-infections with other pathogens, and should not be used as the sole basis for treatment or other patient management decisions. Negative results must be combined with clinical observations, patient history, and epidemiological information. The expected result is Negative. Fact Sheet for Patients: SugarRoll.be Fact Sheet for Healthcare Providers: https://www.woods-mathews.com/ This test is not yet approved or cleared by the  [place] FDA and  has been authorized for detection and/or diagnosis of SARS-CoV-2 by FDA under an Emergency Use Authorization (EUA). This EUA will remain  in effect (meaning this test can be used) for the duration of the COVID-19 declaration under Section 56 4(b)(1) of the Act, 21 U.S.C. section 360bbb-3(b)(1), unless the authorization is terminated or revoked sooner. Performed at [facility], [place] [address] [place], [place] [address]      Radiology Studies: No results found.  Marzetta Board, MD, PhD [facility]  Between 7 am - 7 pm I am available Contact me via Amion or Securechat  Between 7 pm - 7 am I am not available Contact night coverage MD/APP via [employer]

## 2019-02-08 NOTE — Anesthesia Postprocedure Evaluation (Signed)
Anesthesia Post Note  Patient: Paul Ingram  Procedure(s) Performed: ESOPHAGOGASTRODUODENOSCOPY (EGD) WITH PROPOFOL (N/A )  Patient location during evaluation: Endoscopy Anesthesia Type: General Level of consciousness: awake and alert Pain management: pain level controlled Vital Signs Assessment: post-procedure vital signs reviewed and stable Respiratory status: spontaneous breathing, nonlabored ventilation, respiratory function stable and patient connected to nasal cannula oxygen Cardiovascular status: blood pressure returned to baseline and stable Postop Assessment: no apparent nausea or vomiting Anesthetic complications: no     Last Vitals:  Vitals:   02/07/19 2059 02/08/19 0423  BP: 125/88 117/81  Pulse: 91 78  Resp: 19 18  Temp: 37.3 C 37.1 C  SpO2: 100% 99%    Last Pain:  Vitals:   02/08/19 0423  TempSrc: Oral  PainSc:                  Precious Haws Piscitello

## 2019-02-09 LAB — TYPE AND SCREEN
ABO/RH(D): A POS
Antibody Screen: NEGATIVE
Unit division: 0
Unit division: 0
Unit division: 0

## 2019-02-09 LAB — BPAM RBC
Blood Product Expiration Date: 202012142359
Blood Product Expiration Date: 202012142359
Blood Product Expiration Date: 202012142359
ISSUE DATE / TIME: 202011121348
ISSUE DATE / TIME: 202011121859
ISSUE DATE / TIME: 202011141151
Unit Type and Rh: 6200
Unit Type and Rh: 6200
Unit Type and Rh: 6200

## 2019-02-09 LAB — COMPREHENSIVE METABOLIC PANEL
ALT: 23 U/L (ref 0–44)
AST: 17 U/L (ref 15–41)
Albumin: 2.7 g/dL — ABNORMAL LOW (ref 3.5–5.0)
Alkaline Phosphatase: 32 U/L — ABNORMAL LOW (ref 38–126)
Anion gap: 3 — ABNORMAL LOW (ref 5–15)
BUN: 12 mg/dL (ref 8–23)
CO2: 25 mmol/L (ref 22–32)
Calcium: 8 mg/dL — ABNORMAL LOW (ref 8.9–10.3)
Chloride: 112 mmol/L — ABNORMAL HIGH (ref 98–111)
Creatinine, Ser: 0.85 mg/dL (ref 0.61–1.24)
GFR calc Af Amer: >60 mL/min (ref >60)
GFR calc non Af Amer: >60 mL/min (ref >60)
Glucose, Bld: 112 mg/dL — ABNORMAL HIGH (ref 70–99)
Potassium: 4.2 mmol/L (ref 3.5–5.1)
Sodium: 140 mmol/L (ref 135–145)
Total Bilirubin: 0.7 mg/dL (ref 0.3–1.2)
Total Protein: 5.3 g/dL — ABNORMAL LOW (ref 6.5–8.1)

## 2019-02-09 LAB — CBC
HCT: 24.3 % — ABNORMAL LOW (ref 39.0–52.0)
Hemoglobin: 8.2 g/dL — ABNORMAL LOW (ref 13.0–17.0)
MCH: 31.5 pg (ref 26.0–34.0)
MCHC: 33.7 g/dL (ref 30.0–36.0)
MCV: 93.5 fL (ref 80.0–100.0)
Platelets: 200 10*3/uL (ref 150–400)
RBC: 2.6 MIL/uL — ABNORMAL LOW (ref 4.22–5.81)
RDW: 16.8 % — ABNORMAL HIGH (ref 11.5–15.5)
WBC: 9.9 10*3/uL (ref 4.0–10.5)
nRBC: 1.4 % — ABNORMAL HIGH (ref 0.0–0.2)

## 2019-02-09 MED ORDER — PANTOPRAZOLE SODIUM 40 MG PO TBEC: 40.0000 mg | DELAYED_RELEASE_TABLET | Freq: Two times a day (BID) | ORAL | 0 refills | Status: DC

## 2019-02-09 MED ORDER — ATORVASTATIN CALCIUM 20 MG PO TABS: 40.0000 mg | ORAL_TABLET | Freq: Every day | ORAL | Status: DC

## 2019-02-09 NOTE — Discharge Summary (Signed)
Physician Discharge Summary  Paul Ingram [id number] DOB: Jun 27, 1955 DOA: 02/06/2019  PCP: Delsa Grana, PA-C  Admit date: 02/06/2019 Discharge date: 02/09/2019  Admitted From: Home Disposition: Home  Recommendations for Outpatient Follow-up:  1. Follow up with PCP in 1-2 weeks 2. Continue PPI twice daily for 3 months  Home Health: None Equipment/Devices: None  Discharge Condition: Stable CODE STATUS: Full code Diet recommendation: Regular diet  HPI: Per admitting MD, Paul Ingram is a 63 y.o. male history of hyperlipidemia, hypertension, borderline diabetes and obesity presented to hospital with complaints of chest tightness and shortness of breath for 1 day.  Patient stated that his breathing got better when he was sitting up.  There was no mention of fever, chills or rigor. Patient reported that he has been having some dark stools for the last 1 week.   Patient stated that he had been having some shoulder pain and was taking NSAIDs as prescribed by his primary care physician.  He denies any epigastric pain, hematemesis, melena nausea or vomiting.  Patient denies recent travel or sick contact..  States that he drinks beer a day or so and smokes almost half a pack a day.  Patient denies any syncope, but has been feeling some dizziness and lightheadedness.  Patient denies any urinary urgency, frequency or dysuria. ED Course: In the ED, patient was noted to have significant LVH on the EKG.  Patient was started on PPI and 1 unit of blood transfusion was requested.  Dr. Vicente Males GI was notified from the ED. Patient was mildly tachycardic in the ED.  His hemoglobin in the ED was 7.3.  WBC at 13.1.  Hospital Course / Discharge diagnoses: Principal Problem Acute blood loss anemia with melena likely due to upper GI bleed -Gastroenterology consulted and evaluated patient, he underwent an EGD on 02/07/2019 which showed 2 nonbleeding duodenal ulcers in the first portion of the duodenum.  The  day after his endoscopy hemoglobin dropped again to 7.5, trending down and had more melanotic stools, he was transfused an additional unit of packed red blood cells and was monitored for an additional night.  His hemoglobin has remained stable afterwards, his stool is no longer melanotic and he will be discharged home in stable condition.  He was placed on PPI twice daily for at least 3 months.  He was advised to avoid all NSAIDs and stop drinking alcohol  Active Problems Chest discomfort, dyspnea likely due to symptomatic anemia -High-sensitivity troponins negative, 2D echo done was overall unremarkable, normal EF 60 to 65% Leukocytosis -Likely reactive, monitor, afebrile, resolved without antibiotics Shoulder pain -Patient was on meloxicam and steroids as an outpatient, hold both.  Tylenol as needed.  Discharge Instructions   Allergies as of 02/09/2019      Reactions   Shellfish Allergy Anaphylaxis      Medication List    STOP taking these medications   meloxicam 15 MG tablet Commonly known as: MOBIC   predniSONE 5 MG (48) Tbpk tablet Commonly known as: STERAPRED UNI-PAK 48 TAB     TAKE these medications   atorvastatin 40 MG tablet Commonly known as: LIPITOR Take 1 tablet (40 mg total) by mouth daily.   cyclobenzaprine 10 MG tablet Commonly known as: FLEXERIL Take 0.5-1 tablets (5-10 mg total) by mouth 3 (three) times daily as needed for muscle spasms (muscle tension).   pantoprazole 40 MG tablet Commonly known as: Protonix Take 1 tablet (40 mg total) by mouth 2 (two) times daily.  sildenafil 25 MG tablet Commonly known as: VIAGRA Take 1 tablet (25 mg total) by mouth daily as needed for erectile dysfunction.      Follow-up Information    Delsa Grana, PA-C. Schedule an appointment as soon as possible for a visit in 1 week(s).   Specialty: Family Medicine Contact information: [address] [place] [address] [phone]            Consultations:  Gastroenterology  Procedures/Studies: Findings: The esophagus was normal. The stomach was normal. The cardia and gastric fundus were normal on retroflexion. Two non-bleeding cratered duodenal ulcers with a clean ulcer base  (Forrest Class III) were found in the first portion of the duodenum. The  largest lesion was 8 mm in largest dimension. Impression: - Normal esophagus. - Normal stomach. - Non-bleeding duodenal ulcers with a clean ulcer base  (Forrest Class III). - No specimens collected. Recommendation: - Return patient to hospital ward for ongoing care. - Clear liquid diet today. - Continue present medications. - Stop NSAID check H pylori stool antigen Continue PPI for 6 weeks  2D echo IMPRESSIONS 1. Left ventricular ejection fraction, by visual estimation, is 60 to 65%. The left ventricle has normal function. There is mildly increased left ventricular hypertrophy. 2. Left ventricular diastolic parameters are indeterminate. 3. Global right ventricle has normal systolic function.The right ventricular size is normal. No increase in right ventricular wall thickness. 4. Left atrial size was normal. 5. Right atrial size was normal. 6. The mitral valve is normal in structure. Mild mitral valve regurgitation. No evidence of mitral stenosis. 7. The tricuspid valve is normal in structure. Tricuspid valve regurgitation is mild. 8. The aortic valve is normal in structure. Aortic valve regurgitation is not visualized. Mild aortic valve sclerosis without stenosis. 9. The pulmonic valve was normal in structure. Pulmonic valve regurgitation is not visualized. 10. Moderately elevated pulmonary artery systolic  pressure. 11. The tricuspid regurgitant velocity is 3.20 m/s, and with an assumed right atrial pressure of 5 mmHg, the estimated right ventricular systolic pressure is moderately elevated at 46.1 mmHg. 12. The inferior vena cava is normal in size with greater than 50% respiratory variability, suggesting right atrial pressure of 3 mmHg.   Dg Chest 2 View  Result Date: 02/06/2019 CLINICAL DATA:  Chest pain EXAM: CHEST - 2 VIEW COMPARISON:  None. FINDINGS: The heart size and mediastinal contours are within normal limits. Both lungs are clear. No pleural effusion. The visualized skeletal structures are unremarkable. IMPRESSION: No active cardiopulmonary disease. Electronically Signed   By: Macy Mis M.D.   On: 02/06/2019 10:58   Dg Cervical Spine Complete  Result Date: 01/27/2019 CLINICAL DATA:  Cervical spine pain, left posterior muscle tension and pain, limited range of motion for 3 weeks EXAM: CERVICAL SPINE - COMPLETE 4+ VIEW COMPARISON:  None. FINDINGS: Straightening of the normal cervical lordosis. No abnormal facet widening. Dens is appears intact. Lateral masses of C1 are well apposed to those of C2. No traumatic listhesis. Multilevel cervical spondylitic and facet degenerative changes are noted. Degenerative appearing fusion across the to C6-7 vertebral bodies. No acute or suspicious osseous lesion. Coarse mineralization in the soft tissues anterior to C2-3 disc space without frank prevertebral soft tissue thickening, may reflect a calcified lymph node or tonsillar mineralization. IMPRESSION: 1. Multilevel cervical spondylitic and facet degenerative changes. 2. Degenerative appearing fusion across the to C6-7 vertebral bodies. 3. No frank prevertebral soft tissue thickening. Mineralization anterior to the C2-3 level within the soft tissues may  reflect tonsillar mineralization or a calcified lymph node. Correlate with visual inspection. Electronically Signed   By: Lovena Le M.D.   On:  01/27/2019 16:53     Subjective: -Feeling well, anxious to go home  Discharge Exam: BP (!) 133/94 (BP Location: Right Arm)    Pulse 77    Temp 97.8 F (36.6 C) (Oral)    Resp 18    Ht 5\' 9"  (1.753 m)    Wt 102.7 kg    SpO2 100%    BMI 33.44 kg/m   General: Pt is alert, awake, not in acute distress   The results of significant diagnostics from this hospitalization (including imaging, microbiology, ancillary and laboratory) are listed below for reference.     Microbiology: Recent Results (from the past 240 hour(s))  SARS CORONAVIRUS 2 (TAT 6-24 HRS) Nasopharyngeal Nasopharyngeal Swab     Status: None   Collection Time: 02/06/19  1:07 PM   Specimen: Nasopharyngeal Swab  Result Value Ref Range Status   SARS Coronavirus 2 NEGATIVE NEGATIVE Final    Comment: (NOTE) SARS-CoV-2 target nucleic acids are NOT DETECTED. The SARS-CoV-2 RNA is generally detectable in upper and lower respiratory specimens during the acute phase of infection. Negative results do not preclude SARS-CoV-2 infection, do not rule out co-infections with other pathogens, and should not be used as the sole basis for treatment or other patient management decisions. Negative results must be combined with clinical observations, patient history, and epidemiological information. The expected result is Negative. Fact Sheet for Patients: SugarRoll.be Fact Sheet for Healthcare Providers: https://www.woods-mathews.com/ This test is not yet approved or cleared by the [place] FDA and  has been authorized for detection and/or diagnosis of SARS-CoV-2 by FDA under an Emergency Use Authorization (EUA). This EUA will remain  in effect (meaning this test can be used) for the duration of the COVID-19 declaration under Section 56 4(b)(1) of the Act, 21 U.S.C. section 360bbb-3(b)(1), unless the authorization is terminated or revoked sooner. Performed at [facility], [place] [address], [place], [place] [address]      Labs: Basic Metabolic Panel: Recent Labs  Lab 02/06/19 1032 02/07/19 0258 02/08/19 0450 02/09/19 0414  NA 139 143 139 140  K 4.1 3.8 3.9 4.2  CL 110 113* 112* 112*  CO2 21* 23 22 25   GLUCOSE 166* 125* 106* 112*  BUN 30* 20 12 12   CREATININE 0.81 0.69 0.74 0.85  CALCIUM 8.7* 8.2* 8.0* 8.0*   Liver Function Tests: Recent Labs  Lab 02/07/19 0258 02/09/19 0414  AST 17 17  ALT 25 23  ALKPHOS 32* 32*  BILITOT 0.7 0.7  PROT 5.3* 5.3*  ALBUMIN 3.0* 2.7*   CBC: Recent Labs  Lab 02/06/19 1032 02/07/19 0258 02/08/19 0633 02/08/19 1927 02/09/19 0414  WBC 13.1* 14.2* 10.6* 9.9 9.9  HGB 7.3* 8.4* 7.5* 8.6* 8.2*  HCT 23.2* 24.8* 22.1* 25.3* 24.3*  MCV 99.6 93.6 94.8 92.0 93.5  PLT 243 193 202 202 200   CBG: No results for input(s): GLUCAP in the last 168 hours. Hgb A1c No results for input(s): HGBA1C in the last 72 hours. Lipid Profile No results for input(s): CHOL, HDL, LDLCALC, TRIG, CHOLHDL, LDLDIRECT in the last 72 hours. Thyroid function studies No results for input(s): TSH, T4TOTAL, T3FREE, THYROIDAB in the last 72 hours.  Invalid input(s): FREET3 Urinalysis No results found for: COLORURINE, APPEARANCEUR, LABSPEC, PHURINE, GLUCOSEU, HGBUR, BILIRUBINUR, KETONESUR, PROTEINUR, UROBILINOGEN, NITRITE, LEUKOCYTESUR  FURTHER DISCHARGE INSTRUCTIONS:   Get Medicines reviewed  and adjusted: Please take all your medications with you for your next visit with your Primary MD   Laboratory/radiological data: Please request your Primary MD to go over all hospital tests and procedure/radiological results at the follow up, please ask your Primary MD to get all Hospital records sent to his/her office.   In some cases, they will be blood work, cultures and biopsy results pending at the time of your discharge. Please request that your primary care M.D. goes through all the records of your hospital data and follows up on these results.     Also Note the following: If you experience worsening of your admission symptoms, develop shortness of breath, life threatening emergency, suicidal or homicidal thoughts you must seek medical attention immediately by calling 911 or calling your MD immediately  if symptoms less severe.   You must read complete instructions/literature along with all the possible adverse reactions/side effects for all the Medicines you take and that have been prescribed to you. Take any new Medicines after you have completely understood and accpet all the possible adverse reactions/side effects.    Do not drive when taking Pain medications or sleeping medications (Benzodaizepines)   Do not take more than prescribed Pain, Sleep and Anxiety Medications. It is not advisable to combine anxiety,sleep and pain medications without talking with your primary care practitioner   Special Instructions: If you have smoked or chewed Tobacco  in the last 2 yrs please stop smoking, stop any regular Alcohol  and or any Recreational drug use.   Wear Seat belts while driving.   Please note: You were cared for by a hospitalist during your hospital stay. Once you are discharged, your primary care physician will handle any further medical issues. Please note that NO REFILLS for any discharge medications will be authorized once you are discharged, as it is imperative that you return to your primary care physician (or establish a relationship with a primary care physician if you do not have one) for your post hospital discharge needs so that they can reassess your need for medications and monitor your lab values.  Time coordinating discharge: 25 minutes  SIGNED:  Marzetta Board, MD, PhD 02/09/2019, 1:46 PM

## 2019-02-09 NOTE — Progress Notes (Addendum)
Vonda Antigua, MD [address] [place], [place], [place], [address] [address], [place], [place], [place], [address] Phone: [phone]  Fax: [phone]   Subjective: Reports 1 bowel movement this morning and states it was changing in color to brown, from black stools earlier yesterday.  No abdominal pain.  No nausea or vomiting  Objective: Exam: Vital signs in last 24 hours: Vitals:   02/08/19 1940 02/08/19 2008 02/09/19 0453 02/09/19 0700  BP: 120/76  (!) 133/94   Pulse: 88  77   Resp:  18    Temp: 99.1 F (37.3 C)  97.8 F (36.6 C)   TempSrc: Oral  Oral   SpO2: 100%  100%   Weight:    102.7 kg  Height:       Weight change: 2.91 kg  Intake/Output Summary (Last 24 hours) at 02/09/2019 0813 Last data filed at 02/09/2019 [id number] Gross per 24 hour  Intake 1315.95 ml  Output 251 ml  Net 1064.95 ml    General: No acute distress, AAO x3 Abd: Soft, NT/ND, No HSM Skin: Warm, no rashes Neck: Supple, Trachea midline   Lab Results: Lab Results  Component Value Date   WBC 9.9 02/09/2019   HGB 8.2 (L) 02/09/2019   HCT 24.3 (L) 02/09/2019   MCV 93.5 02/09/2019   PLT 200 02/09/2019   Micro Results: Recent Results (from the past 240 hour(s))  SARS CORONAVIRUS 2 (TAT 6-24 HRS) Nasopharyngeal Nasopharyngeal Swab     Status: None   Collection Time: 02/06/19  1:07 PM   Specimen: Nasopharyngeal Swab  Result Value Ref Range Status   SARS Coronavirus 2 NEGATIVE NEGATIVE Final    Comment: (NOTE) SARS-CoV-2 target nucleic acids are NOT DETECTED. The SARS-CoV-2 RNA is generally detectable in upper and lower respiratory specimens during the acute phase of infection. Negative results do not preclude SARS-CoV-2 infection, do not rule out co-infections with other pathogens, and should not be used as the sole basis for treatment or other patient management decisions. Negative results must be combined with clinical observations, patient history, and epidemiological  information. The expected result is Negative. Fact Sheet for Patients: SugarRoll.be Fact Sheet for Healthcare Providers: https://www.woods-mathews.com/ This test is not yet approved or cleared by the [place] FDA and  has been authorized for detection and/or diagnosis of SARS-CoV-2 by FDA under an Emergency Use Authorization (EUA). This EUA will remain  in effect (meaning this test can be used) for the duration of the COVID-19 declaration under Section 56 4(b)(1) of the Act, 21 U.S.C. section 360bbb-3(b)(1), unless the authorization is terminated or revoked sooner. Performed at [facility], [place] [address] [place], [place] [address]    Studies/Results: No results found. Medications:  Scheduled Meds: . atorvastatin  40 mg Oral q1800  . pantoprazole (PROTONIX) IV  40 mg Intravenous Q12H  . sodium chloride flush  3 mL Intravenous Once  . sodium chloride flush  3 mL Intravenous Q12H   Continuous Infusions: . sodium chloride 125 mL/hr at 02/09/19 0539   PRN Meds:.acetaminophen **OR** acetaminophen, cyclobenzaprine, ondansetron **OR** ondansetron (ZOFRAN) IV   Assessment: Principal Problem:   GIB (gastrointestinal bleeding) Active Problems:   Elevated cholesterol with elevated triglycerides   Hypertension goal BP (blood pressure) < 140/90   Acute blood loss anemia   GI bleed    Plan: No further melanotic bowel movements have occurred since yesterday morning Hemoglobin has been stable over the last 2 checks above 8  If hemoglobin remains stable, no  further indication of repeat endoscopy Patient should continue PPI twice daily for at least 60 days  Patient should follow-up in GI clinic within 2 to 4 weeks of discharge with Dr. Vicente Males  Avoid NSAIDs.  Patient was taking meloxicam at home and was advised against this and other NSAIDs  GI service will sign off at this time, please page with any questions or concerns   LOS:  3 days   Vonda Antigua, MD 02/09/2019, 8:13 AM

## 2019-02-09 NOTE — Discharge Instructions (Signed)
Follow with Delsa Grana, PA-C in 5-7 days  Please get a complete blood count and chemistry panel checked by your Primary MD at your next visit, and again as instructed by your Primary MD. Please get your medications reviewed and adjusted by your Primary MD.  Please request your Primary MD to go over all Hospital Tests and Procedure/Radiological results at the follow up, please get all Hospital records sent to your Prim MD by signing hospital release before you go home.  In some cases, there will be blood work, cultures and biopsy results pending at the time of your discharge. Please request that your primary care M.D. goes through all the records of your hospital data and follows up on these results.  If you had Pneumonia of Lung problems at the Hospital: Please get a 2 view Chest X ray done in 6-8 weeks after hospital discharge or sooner if instructed by your Primary MD.  If you have Congestive Heart Failure: Please call your Cardiologist or Primary MD anytime you have any of the following symptoms:  1) 3 pound weight gain in 24 hours or 5 pounds in 1 week  2) shortness of breath, with or without a dry hacking cough  3) swelling in the hands, feet or stomach  4) if you have to sleep on extra pillows at night in order to breathe  Follow cardiac low salt diet and 1.5 lit/day fluid restriction.  If you have diabetes Accuchecks 4 times/day, Once in AM empty stomach and then before each meal. Log in all results and show them to your primary doctor at your next visit. If any glucose reading is under 80 or above 300 call your primary MD immediately.  If you have Seizure/Convulsions/Epilepsy: Please do not drive, operate heavy machinery, participate in activities at heights or participate in high speed sports until you have seen by Primary MD or a Neurologist and advised to do so again. Per [facility] statutes, patients with seizures are not allowed to drive until they have been  seizure-free for six months.  Use caution when using heavy equipment or power tools. Avoid working on ladders or at heights. Take showers instead of baths. Ensure the water temperature is not too high on the home water heater. Do not go swimming alone. Do not lock yourself in a room alone (i.e. bathroom). When caring for infants or small children, sit down when holding, feeding, or changing them to minimize risk of injury to the child in the event you have a seizure. Maintain good sleep hygiene. Avoid alcohol.   If you had Gastrointestinal Bleeding: Please ask your Primary MD to check a complete blood count within one week of discharge or at your next visit. Your endoscopic/colonoscopic biopsies that are pending at the time of discharge, will also need to followed by your Primary MD.  Get Medicines reviewed and adjusted. Please take all your medications with you for your next visit with your Primary MD  Please request your Primary MD to go over all hospital tests and procedure/radiological results at the follow up, please ask your Primary MD to get all Hospital records sent to his/her office.  If you experience worsening of your admission symptoms, develop shortness of breath, life threatening emergency, suicidal or homicidal thoughts you must seek medical attention immediately by calling 911 or calling your MD immediately  if symptoms less severe.  You must read complete instructions/literature along with all the possible adverse reactions/side effects for all the Medicines you take  and that have been prescribed to you. Take any new Medicines after you have completely understood and accpet all the possible adverse reactions/side effects.   Do not drive or operate heavy machinery when taking Pain medications.   Do not take more than prescribed Pain, Sleep and Anxiety Medications  Special Instructions: If you have smoked or chewed Tobacco  in the last 2 yrs please stop smoking, stop any regular  Alcohol  and or any Recreational drug use.  Wear Seat belts while driving.  Please note You were cared for by a hospitalist during your hospital stay. If you have any questions about your discharge medications or the care you received while you were in the hospital after you are discharged, you can call the unit and asked to speak with the hospitalist on call if the hospitalist that took care of you is not available. Once you are discharged, your primary care physician will handle any further medical issues. Please note that NO REFILLS for any discharge medications will be authorized once you are discharged, as it is imperative that you return to your primary care physician (or establish a relationship with a primary care physician if you do not have one) for your aftercare needs so that they can reassess your need for medications and monitor your lab values.  You can reach the hospitalist office at phone [phone] or fax [phone]   If you do not have a primary care physician, you can call [phone] for a physician referral.  Activity: As tolerated with Full fall precautions use M[place]arry/cane & assistance as needed    Diet: regular  Disposition Home

## 2019-02-10 ENCOUNTER — Encounter: Payer: Self-pay | Admitting: Gastroenterology

## 2019-02-10 ENCOUNTER — Telehealth: Payer: Self-pay

## 2019-02-10 NOTE — Telephone Encounter (Signed)
Transition Care Management Follow-up Telephone Call  Date of discharge and from where: 02/09/19 [facility]  How have you been since you were released from the hospital? Pt states he is feeling a little better.   Any questions or concerns? No   Items Reviewed:  Did the pt receive and understand the discharge instructions provided? Yes   Medications obtained and verified? Yes   Any new allergies since your discharge? No   Dietary orders reviewed? Yes  Do you have support at home? Yes   Functional Questionnaire: (I = Independent and D = Dependent) ADLs: I  Bathing/Dressing- I  Meal Prep- I  Eating- I  Maintaining continence- I  Transferring/Ambulation- I  Managing Meds- I  Follow up appointments reviewed:   PCP Hospital f/u appt confirmed? Yes  Scheduled to see Delsa Grana PAC on 02/18/19  @ 3:20.  [facility] f/u appt confirmed? No    Are transportation arrangements needed? No   If their condition worsens, is the pt aware to call PCP or go to the Emergency Dept.? Yes  Was the patient provided with contact information for the PCP's office or ED? Yes  Was to pt encouraged to call back with questions or concerns? Yes

## 2019-02-11 NOTE — Telephone Encounter (Signed)
Tried to call mailbox full 

## 2019-02-12 NOTE — Telephone Encounter (Signed)
[email or web address]

## 2019-02-18 ENCOUNTER — Encounter: Payer: Self-pay | Admitting: Family Medicine

## 2019-02-18 ENCOUNTER — Other Ambulatory Visit: Payer: Self-pay

## 2019-02-18 ENCOUNTER — Ambulatory Visit (INDEPENDENT_AMBULATORY_CARE_PROVIDER_SITE_OTHER): Payer: BC Managed Care – PPO | Admitting: Family Medicine

## 2019-02-18 VITALS — BP 118/72 | HR 96 | Temp 97.5°F | Resp 14 | Wt 225.1 lb

## 2019-02-18 DIAGNOSIS — K264 Chronic or unspecified duodenal ulcer with hemorrhage: Secondary | ICD-10-CM

## 2019-02-18 DIAGNOSIS — I951 Orthostatic hypotension: Secondary | ICD-10-CM

## 2019-02-18 DIAGNOSIS — R0609 Other forms of dyspnea: Secondary | ICD-10-CM

## 2019-02-18 DIAGNOSIS — R5383 Other fatigue: Secondary | ICD-10-CM | POA: Diagnosis not present

## 2019-02-18 DIAGNOSIS — Z09 Encounter for follow-up examination after completed treatment for conditions other than malignant neoplasm: Secondary | ICD-10-CM

## 2019-02-18 DIAGNOSIS — R0600 Dyspnea, unspecified: Secondary | ICD-10-CM

## 2019-02-18 DIAGNOSIS — I517 Cardiomegaly: Secondary | ICD-10-CM

## 2019-02-18 DIAGNOSIS — D62 Acute posthemorrhagic anemia: Secondary | ICD-10-CM

## 2019-02-18 NOTE — Progress Notes (Signed)
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 not supplementing iron, but I suspect he needs some Overall HR and VS stable with orthostatics and ambulation  2. Gastrointestinal hemorrhage associated with duodenal ulcer  K26.4 CBC with  Differential/Platelet    Iron, TIBC and Ferritin Panel   Reviewed his medications currently not having any symptoms, continue Protonix twice daily  3. Fatigue, unspecified type  R53.83 CBC with Differential/Platelet    Iron, TIBC and Ferritin Panel    CMP w GFR   Likely due to blood loss encouraged him to avoid strenuous activity and expected gradual return to his baseline over the next 2 months  4. Orthostasis  I95.1    Patient endorses orthostatic near syncope his orthostatics were negative, he seems to be hemodynamically stable, encouraged slow positional changes  5. DOE (dyspnea on exertion)  R06.00    Likely still due to blood loss we will recheck his CBC  6. Encounter for examination following treatment at hospital  Z09    Reviewed all hospital records spent time reviewing all of his labs, his medication changes, and expected course towards full recovery     No follow-ups on file.   Delsa Grana, PA-C 02/18/19 3:54 PM

## 2019-02-18 NOTE — Patient Instructions (Signed)
Since you lost a lot of blood its normal to have fatigue shortness of breath and rapid heart rate when trying to do previously normal physical activity.  Take care to not exert yourself, take extra time when changing positions to that you feel steady.  I am going to recheck your labs and blood levels I will contact you tomorrow about them.  We may need to just give your body time and give it some iron supplements so that over the next 2 to 3 months it can replace all the blood that you lost.  If for whatever reason you are blood counts are decreased from when you are in the hospital we will need to get you another blood transfusion -and depending on the labs are how you are feeling may need to have you go back to the hospital or set this up outpatient with a hematologist or with the gastroenterologist.  If you pass out or have severe chest pain, shortness of breath or confusion you do need to go to the ER for emergent evaluation.  Iron Deficiency Anemia, Adult Iron-deficiency anemia is when you have a low amount of red blood cells or hemoglobin. This happens because you have too little iron in your body. Hemoglobin carries oxygen to parts of the body. Anemia can cause your body to not get enough oxygen. It may or may not cause symptoms. Follow these instructions at home: Medicines  Take over-the-counter and prescription medicines only as told by your doctor. This includes iron pills (supplements) and vitamins.  If you cannot handle taking iron pills by mouth, ask your doctor about getting iron through: ? A vein (intravenously). ? A shot (injection) into a muscle.  Take iron pills when your stomach is empty. If you cannot handle this, take them with food.  Do not drink milk or take antacids at the same time as your iron pills.  To prevent trouble pooping (constipation), eat fiber or take medicine (stool softener) as told by your doctor. Eating and drinking   Talk with your doctor before  changing the foods you eat. He or she may tell you to eat foods that have a lot of iron, such as: ? Liver. ? Lowfat (lean) beef. ? Breads and cereals that have iron added to them (fortified breads and cereals). ? Eggs. ? Dried fruit. ? Dark green, leafy vegetables.  Drink enough fluid to keep your pee (urine) clear or pale yellow.  Eat fresh fruits and vegetables that are high in vitamin C. They help your body to use iron. Foods with a lot of vitamin C include: ? Oranges. ? Peppers. ? Tomatoes. ? Mangoes. General instructions  Return to your normal activities as told by your doctor. Ask your doctor what activities are safe for you.  Keep yourself clean, and keep things clean around you (your surroundings). Anemia can make you get sick more easily.  Keep all follow-up visits as told by your doctor. This is important. Contact a doctor if:  You feel sick to your stomach (nauseous).  You throw up (vomit).  You feel weak.  You are sweating for no clear reason.  You have trouble pooping, such as: ? Pooping (having a bowel movement) less than 3 times a week. ? Straining to poop. ? Having poop that is hard, dry, or larger than normal. ? Feeling full or bloated. ? Pain in the lower belly. ? Not feeling better after pooping. Get help right away if:  You pass out (faint). If  this happens, do not drive yourself to the hospital. Call your local emergency services (911 in the [place]  You have chest pain.  You have shortness of breath that: ? Is very bad. ? Gets worse with physical activity.  You have a fast heartbeat.  You get light-headed when getting up from sitting or lying down. This information is not intended to replace advice given to you by your health care provider. Make sure you discuss any questions you have with your health care provider. Document Released: 04/15/2010 Document Revised: 02/23/2017 Document Reviewed: 12/01/2015 Elsevier Patient Education  2020  [employer].

## 2019-02-19 LAB — CBC WITH DIFFERENTIAL/PLATELET
Absolute Monocytes: 742 cells/uL (ref 200–950)
Basophils Absolute: 29 cells/uL (ref 0–200)
Basophils Relative: 0.4 %
Eosinophils Absolute: 230 cells/uL (ref 15–500)
Eosinophils Relative: 3.2 %
HCT: 32.7 % — ABNORMAL LOW (ref 38.5–50.0)
Hemoglobin: 10.5 g/dL — ABNORMAL LOW (ref 13.2–17.1)
Lymphs Abs: 1714 cells/uL (ref 850–3900)
MCH: 29.5 pg (ref 27.0–33.0)
MCHC: 32.1 g/dL (ref 32.0–36.0)
MCV: 91.9 fL (ref 80.0–100.0)
MPV: 9.4 fL (ref 7.5–12.5)
Monocytes Relative: 10.3 %
Neutro Abs: 4486 cells/uL (ref 1500–7800)
Neutrophils Relative %: 62.3 %
Platelets: 435 10*3/uL — ABNORMAL HIGH (ref 140–400)
RBC: 3.56 10*6/uL — ABNORMAL LOW (ref 4.20–5.80)
RDW: 14.1 % (ref 11.0–15.0)
Total Lymphocyte: 23.8 %
WBC: 7.2 10*3/uL (ref 3.8–10.8)

## 2019-02-19 LAB — IRON,TIBC AND FERRITIN PANEL
%SAT: 7 % (calc) — ABNORMAL LOW (ref 20–48)
Ferritin: 53 ng/mL (ref 24–380)
Iron: 28 ug/dL — ABNORMAL LOW (ref 50–180)
TIBC: 385 mcg/dL (calc) (ref 250–425)

## 2019-02-19 LAB — COMPLETE METABOLIC PANEL WITH GFR
AG Ratio: 1.5 (calc) (ref 1.0–2.5)
ALT: 22 U/L (ref 9–46)
AST: 19 U/L (ref 10–35)
Albumin: 4 g/dL (ref 3.6–5.1)
Alkaline phosphatase (APISO): 56 U/L (ref 35–144)
BUN: 11 mg/dL (ref 7–25)
CO2: 30 mmol/L (ref 20–32)
Calcium: 9.3 mg/dL (ref 8.6–10.3)
Chloride: 107 mmol/L (ref 98–110)
Creat: 0.93 mg/dL (ref 0.70–1.25)
GFR, Est African American: 99 mL/min/{1.73_m2} (ref >=60)
GFR, Est Non African American: 86 mL/min/{1.73_m2} (ref >=60)
Globulin: 2.6 g/dL (calc) (ref 1.9–3.7)
Glucose, Bld: 102 mg/dL — ABNORMAL HIGH (ref 65–99)
Potassium: 4.3 mmol/L (ref 3.5–5.3)
Sodium: 143 mmol/L (ref 135–146)
Total Bilirubin: 0.3 mg/dL (ref 0.2–1.2)
Total Protein: 6.6 g/dL (ref 6.1–8.1)

## 2019-02-24 ENCOUNTER — Encounter: Payer: Self-pay | Admitting: Family Medicine

## 2019-02-25 NOTE — Addendum Note (Signed)
Addended by: Delsa Grana on: 02/25/2019 05:08 PM   Modules accepted: Orders

## 2019-03-05 ENCOUNTER — Other Ambulatory Visit: Payer: Self-pay | Admitting: Family Medicine

## 2019-03-12 ENCOUNTER — Encounter: Payer: Self-pay | Admitting: Family Medicine

## 2019-03-12 ENCOUNTER — Other Ambulatory Visit: Payer: Self-pay

## 2019-03-12 ENCOUNTER — Ambulatory Visit (INDEPENDENT_AMBULATORY_CARE_PROVIDER_SITE_OTHER): Payer: BC Managed Care – PPO | Admitting: Family Medicine

## 2019-03-12 VITALS — BP 122/82 | HR 94 | Temp 97.9°F | Resp 14 | Ht 72.0 in | Wt 226.7 lb

## 2019-03-12 DIAGNOSIS — R400 Somnolence: Secondary | ICD-10-CM

## 2019-03-12 DIAGNOSIS — I10 Essential (primary) hypertension: Secondary | ICD-10-CM | POA: Diagnosis not present

## 2019-03-12 DIAGNOSIS — Z5181 Encounter for therapeutic drug level monitoring: Secondary | ICD-10-CM

## 2019-03-12 DIAGNOSIS — E782 Mixed hyperlipidemia: Secondary | ICD-10-CM | POA: Diagnosis not present

## 2019-03-12 DIAGNOSIS — G4734 Idiopathic sleep related nonobstructive alveolar hypoventilation: Secondary | ICD-10-CM

## 2019-03-12 DIAGNOSIS — K922 Gastrointestinal hemorrhage, unspecified: Secondary | ICD-10-CM

## 2019-03-12 DIAGNOSIS — Z23 Encounter for immunization: Secondary | ICD-10-CM

## 2019-03-12 DIAGNOSIS — D62 Acute posthemorrhagic anemia: Secondary | ICD-10-CM

## 2019-03-12 DIAGNOSIS — G4733 Obstructive sleep apnea (adult) (pediatric): Secondary | ICD-10-CM

## 2019-03-12 MED ORDER — PANTOPRAZOLE SODIUM 40 MG PO TBEC: 40.0000 mg | DELAYED_RELEASE_TABLET | Freq: Two times a day (BID) | ORAL | 1 refills | Status: DC

## 2019-03-12 MED ORDER — FERROUS SULFATE 325 (65 FE) MG PO TBEC: 325.0000 mg | DELAYED_RELEASE_TABLET | Freq: Every day | ORAL | 1 refills | Status: DC

## 2019-03-12 NOTE — Progress Notes (Signed)
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  PHQ - 2 Score 0 0 0 0 0  Altered sleeping 0 0 1 0 0  Tired, decreased energy 0 0 0 0 0  Change in appetite 0 0 0 0 0  Feeling bad or failure about yourself  0 0 0 0 0  Trouble  concentrating 0 0 0 0 0  Moving slowly or fidgety/restless 0 0 0 0 0  Suicidal thoughts 0 0 0 0 0  PHQ-9 Score 0 0 1 0 0  Difficult doing work/chores Not difficult at all Not difficult at all Not difficult at all Not difficult at all Not difficult at all    phq 9 is negative Reviewed today  Fall Risk: Fall Risk  03/12/2019 02/18/2019 01/27/2019 01/16/2019 08/28/2018  Falls in the past year? 0 0 0 0 0  Comment - - - - -  Number falls in past yr: 0 0 0 0 -  Injury with Fall? 0 0 0 0 -  Follow up - - - Falls evaluation completed -      Functional Status Survey: Is the patient deaf or have difficulty hearing?: No Does the patient have difficulty seeing, even when wearing glasses/contacts?: No Does the patient have difficulty concentrating, remembering, or making decisions?: No Does the patient have difficulty walking or climbing stairs?: No Does the patient have difficulty dressing or bathing?: No Does the patient have difficulty doing errands alone such as visiting a doctor's office or shopping?: No    Assessment & Plan:     1. Hypertension goal BP (blood pressure) < 140/90 At goal, stable, no meds - CMP w GFR  2. Elevated cholesterol with elevated triglycerides  Unclear if pt is really taking meds, last appt he was instructed to restart statin, recheck labs, reviewed that he needs to take statin daily - CMP w GFR - Lipid Panel  3. Acute blood loss anemia F/up CBC - still complain of sx, although VSS, and last visit and this HR normal, ambulatory pulse ox good, no near syncope, orthostatics negative - some of his sx may be due to OSA that he did not f/up on  - CBC w/ Diff - ferrous sulfate 325 (65 FE) MG EC tablet; Take 1 tablet (325 mg total) by mouth daily with breakfast. Take every other day if cannot tolerate daily  Dispense: 90 tablet; Refill: 1 - pantoprazole (PROTONIX) 40 MG tablet; Take 1 tablet (40 mg total) by mouth 2 (two) times daily.  Dispense: 120 tablet; Refill:  1  4. Encounter for medication monitoring  - CMP w GFR - Lipid Panel - CBC w/ Diff  5. Acute upper GI bleed Resume ppi daily until GI f/up - pantoprazole (PROTONIX) 40 MG tablet; Take 1 tablet (40 mg total) by mouth 2 (two) times daily.  Dispense: 120 tablet; Refill: 1  6. Daytime sleepiness Needs to f/up with Dr. Ashby Dawes  7. OSA (obstructive sleep apnea) f/up with Dr. Ashby Dawes  8. Sleep related hypoxia f/up with Dr. Ashby Dawes  9. Need for pneumococcal vaccination - Pneumococcal conjugate vaccine 13-valent   Return for 3 month f/up on HLD, HTN, anemia .   Delsa Grana, PA-C 03/12/19 4:15 PM

## 2019-03-12 NOTE — Patient Instructions (Signed)
Get and take iron (the prescription or over the counter) daily if you can tolerate it - or every other day if you have stomach ache, nausea or constipation.  Take with food.  Restart your protonix.  You were supposed to take for at least 6 weeks until you follow up with GI.  We will follow up on your cardiology referral.

## 2019-03-13 ENCOUNTER — Ambulatory Visit: Payer: BC Managed Care – PPO | Admitting: Family Medicine

## 2019-03-13 LAB — COMPLETE METABOLIC PANEL WITH GFR
AG Ratio: 1.4 (calc) (ref 1.0–2.5)
ALT: 17 U/L (ref 9–46)
AST: 21 U/L (ref 10–35)
Albumin: 4 g/dL (ref 3.6–5.1)
Alkaline phosphatase (APISO): 54 U/L (ref 35–144)
BUN: 14 mg/dL (ref 7–25)
CO2: 26 mmol/L (ref 20–32)
Calcium: 9.7 mg/dL (ref 8.6–10.3)
Chloride: 108 mmol/L (ref 98–110)
Creat: 0.98 mg/dL (ref 0.70–1.25)
GFR, Est African American: 93 mL/min/{1.73_m2} (ref >=60)
GFR, Est Non African American: 81 mL/min/{1.73_m2} (ref >=60)
Globulin: 2.8 g/dL (calc) (ref 1.9–3.7)
Glucose, Bld: 98 mg/dL (ref 65–99)
Potassium: 4.4 mmol/L (ref 3.5–5.3)
Sodium: 142 mmol/L (ref 135–146)
Total Bilirubin: 0.3 mg/dL (ref 0.2–1.2)
Total Protein: 6.8 g/dL (ref 6.1–8.1)

## 2019-03-13 LAB — CBC WITH DIFFERENTIAL/PLATELET
Absolute Monocytes: 940 cells/uL (ref 200–950)
Basophils Absolute: 37 cells/uL (ref 0–200)
Basophils Relative: 0.5 %
Eosinophils Absolute: 170 cells/uL (ref 15–500)
Eosinophils Relative: 2.3 %
HCT: 34.1 % — ABNORMAL LOW (ref 38.5–50.0)
Hemoglobin: 10.9 g/dL — ABNORMAL LOW (ref 13.2–17.1)
Lymphs Abs: 2087 cells/uL (ref 850–3900)
MCH: 28.5 pg (ref 27.0–33.0)
MCHC: 32 g/dL (ref 32.0–36.0)
MCV: 89.3 fL (ref 80.0–100.0)
MPV: 10.3 fL (ref 7.5–12.5)
Monocytes Relative: 12.7 %
Neutro Abs: 4166 cells/uL (ref 1500–7800)
Neutrophils Relative %: 56.3 %
Platelets: 328 10*3/uL (ref 140–400)
RBC: 3.82 10*6/uL — ABNORMAL LOW (ref 4.20–5.80)
RDW: 14.3 % (ref 11.0–15.0)
Total Lymphocyte: 28.2 %
WBC: 7.4 10*3/uL (ref 3.8–10.8)

## 2019-03-13 LAB — LIPID PANEL
Cholesterol: 168 mg/dL (ref <200)
HDL: 68 mg/dL (ref >=40)
LDL Cholesterol (Calc): 86 mg/dL (calc)
Non-HDL Cholesterol (Calc): 100 mg/dL (calc) (ref <130)
Total CHOL/HDL Ratio: 2.5 (calc) (ref <5.0)
Triglycerides: 59 mg/dL (ref <150)

## 2019-03-14 ENCOUNTER — Ambulatory Visit: Payer: Self-pay | Admitting: *Deleted

## 2019-03-14 ENCOUNTER — Encounter: Payer: Self-pay | Admitting: Emergency Medicine

## 2019-03-14 ENCOUNTER — Other Ambulatory Visit: Payer: Self-pay

## 2019-03-14 ENCOUNTER — Emergency Department
Admission: EM | Admit: 2019-03-14 | Discharge: 2019-03-14 | Disposition: A | Discharge status: Home / Self Care | Payer: BC Managed Care – PPO | Attending: Emergency Medicine | Admitting: Emergency Medicine

## 2019-03-14 DIAGNOSIS — R195 Other fecal abnormalities: Secondary | ICD-10-CM

## 2019-03-14 DIAGNOSIS — I10 Essential (primary) hypertension: Secondary | ICD-10-CM | POA: Diagnosis not present

## 2019-03-14 DIAGNOSIS — Z720 Tobacco use: Secondary | ICD-10-CM | POA: Diagnosis not present

## 2019-03-14 DIAGNOSIS — Z79899 Other long term (current) drug therapy: Secondary | ICD-10-CM | POA: Insufficient documentation

## 2019-03-14 LAB — COMPREHENSIVE METABOLIC PANEL
ALT: 20 U/L (ref 0–44)
AST: 22 U/L (ref 15–41)
Albumin: 4 g/dL (ref 3.5–5.0)
Alkaline Phosphatase: 57 U/L (ref 38–126)
Anion gap: 9 (ref 5–15)
BUN: 13 mg/dL (ref 8–23)
CO2: 25 mmol/L (ref 22–32)
Calcium: 9.2 mg/dL (ref 8.9–10.3)
Chloride: 106 mmol/L (ref 98–111)
Creatinine, Ser: 0.81 mg/dL (ref 0.61–1.24)
GFR calc Af Amer: >60 mL/min (ref >60)
GFR calc non Af Amer: >60 mL/min (ref >60)
Glucose, Bld: 160 mg/dL — ABNORMAL HIGH (ref 70–99)
Potassium: 3.8 mmol/L (ref 3.5–5.1)
Sodium: 140 mmol/L (ref 135–145)
Total Bilirubin: 0.7 mg/dL (ref 0.3–1.2)
Total Protein: 7.4 g/dL (ref 6.5–8.1)

## 2019-03-14 LAB — CBC
HCT: 36.2 % — ABNORMAL LOW (ref 39.0–52.0)
Hemoglobin: 11.7 g/dL — ABNORMAL LOW (ref 13.0–17.0)
MCH: 28.8 pg (ref 26.0–34.0)
MCHC: 32.3 g/dL (ref 30.0–36.0)
MCV: 89.2 fL (ref 80.0–100.0)
Platelets: 315 10*3/uL (ref 150–400)
RBC: 4.06 MIL/uL — ABNORMAL LOW (ref 4.22–5.81)
RDW: 14.3 % (ref 11.5–15.5)
WBC: 7 10*3/uL (ref 4.0–10.5)
nRBC: 0 % (ref 0.0–0.2)

## 2019-03-14 LAB — TYPE AND SCREEN
ABO/RH(D): A POS
Antibody Screen: NEGATIVE

## 2019-03-14 NOTE — ED Triage Notes (Signed)
Pt here with c/o black tarry stools for the past day, was discharged from the hospital on 03/12/19 for bleeding ulcers. Denies pain, NAD.

## 2019-03-14 NOTE — Discharge Instructions (Addendum)
You have been seen in the emergency department for dark stool.  This is likely result of your iron supplements.  Please continue to take these as needed, please follow-up with your doctor for recheck as needed.  Return to the emergency department for any signs of rectal bleeding such as very dark loose stool, red stool, or bloody vomitus, or any other symptom personally concerning to yourself.

## 2019-03-14 NOTE — ED Notes (Addendum)
Blood drawn on 03/12/19 showed hgb 10.9.

## 2019-03-14 NOTE — Telephone Encounter (Signed)
He called in concerned he had a black, tarry pasty stool this morning.   He was in the hospital for a bleeding ulcer first part of December.   "They gave me 4 bags of blood because I had a bleeding ulcer".   He is also c/o being very weak all the time.  See triage notes.    I have referred him to the ED.  He was agreeable  To going.    He is going to [facility] now. I sent these notes to Delsa Grana, PA-C s office.   Reason for Disposition . Tarry or jet black-colored stool (not dark green)  Answer Assessment - Initial Assessment Questions 1. COLOR: "What color is it?" "Is that color in part or all of the stool?"     I'm having black stools this morning.   I've been in the hospital for this before.  I'm on iron that started Wed.     2. ONSET: "When was the unusual color first noted?"     This morning    The stool is thick and pasty.     3. CAUSE: "Have you eaten any food or taken any medicine of this color?" (See listing in BACKGROUND)     I'm on iron.   4. OTHER SYMPTOMS: "Do you have any other symptoms?" (e.g., diarrhea, jaundice, abdominal pain, fever).     I had abd pain yesterday while I was at work.   No diarrhea.   No red blood seen.   Dec 12 in hospital for 4-5 days.     I had a bleeding ulcer.   I'm not to take any more NSAIDS.  Or BC powders.  Answer Assessment - Initial Assessment Questions 1. APPEARANCE of BLOOD: "What color is it?" "Is it passed separately, on the surface of the stool, or mixed in with the stool?"      Black 2. AMOUNT: "How much blood was passed?"      Noticed this morning 3. FREQUENCY: "How many times has blood been passed with the stools?"      Just this morning 4. ONSET: "When was the blood first seen in the stools?" (Days or weeks)      This morning 5. DIARRHEA: "Is there also some diarrhea?" If so, ask: "How many diarrhea stools were passed in past 24 hours?"       No 6. CONSTIPATION: "Do you have constipation?" If so, "How bad  is it?"     I have a lot of gas.    I feel like I need to go but nothing comes out. 7. RECURRENT SYMPTOMS: "Have you had blood in your stools before?" If so, ask: "When was the last time?" and "What happened that time?"      Yes   Been in hospital. 8. BLOOD THINNERS: "Do you take any blood thinners?" (e.g., Coumadin/warfarin, Pradaxa/dabigatran, aspirin)     No 9. OTHER SYMPTOMS: "Do you have any other symptoms?"  (e.g., abdominal pain, vomiting, dizziness, fever)     Very weak all the time. 10. PREGNANCY: "Is there any chance you are pregnant?" "When was your last menstrual period?"       N/A  Protocols used: RECTAL BLEEDING-A-AH, STOOLS - UNUSUAL COLOR-A-AH

## 2019-03-14 NOTE — ED Notes (Signed)
Pt verbalized understanding of discharge instructions. NAD at this time. 
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
## 2019-04-07 ENCOUNTER — Ambulatory Visit: Payer: BC Managed Care – PPO | Admitting: Gastroenterology

## 2019-04-07 ENCOUNTER — Other Ambulatory Visit: Payer: Self-pay

## 2019-04-07 VITALS — BP 153/102 | HR 83 | Temp 98.4°F | Ht 72.0 in | Wt 230.4 lb

## 2019-04-07 DIAGNOSIS — E538 Deficiency of other specified B group vitamins: Secondary | ICD-10-CM | POA: Diagnosis not present

## 2019-04-07 DIAGNOSIS — R140 Abdominal distension (gaseous): Secondary | ICD-10-CM

## 2019-04-07 DIAGNOSIS — D539 Nutritional anemia, unspecified: Secondary | ICD-10-CM | POA: Diagnosis not present

## 2019-04-07 DIAGNOSIS — K269 Duodenal ulcer, unspecified as acute or chronic, without hemorrhage or perforation: Secondary | ICD-10-CM | POA: Diagnosis not present

## 2019-04-07 MED ORDER — VITAMIN B-12 1000 MCG PO TABS: 1000.0000 ug | ORAL_TABLET | Freq: Every day | ORAL | 0 refills | Status: AC

## 2019-04-07 MED ORDER — OMEPRAZOLE 40 MG PO CPDR: 40.0000 mg | DELAYED_RELEASE_CAPSULE | Freq: Every day | ORAL | 1 refills | Status: DC

## 2019-04-07 NOTE — Progress Notes (Signed)
Jonathon Bellows MD, MRCP(U.K) [address]  [place]  [place], [place] [address]  Main: [phone]  Fax: [phone]   Primary Care Physician: Delsa Grana, PA-C  Primary Gastroenterologist:  Dr. Harland German follow-up  HPI: Paul Ingram is a 64 y.o. male was admitted to the hospital on 02/06/2019 and was seen by myself the next day.  At that time he presented with shortness of breath of 1 day duration and was noted to have a hemoglobin of 7.3 g and an MCV of 99.6.  IMA globin was 14.9 g a year prior.  Iron studies were normal but B12 was very low.  He had been taking Advil for shoulder pain for years and noticed for a week that he had been having tarry black stools.  He underwent an upper endoscopy on 02/07/2019 and was found to have 2 nonbleeding ulcers of the duodenum with clean base.  Advised to stop all NSAIDs and follow-up in the GI clinic.    Interval history 02/09/2019-04/07/2019 03/14/2019: Hemoglobin 11.7 g and MCV of 89.2. He is doing well.  No complaints.  Taking iron tablets.  States his stool if dark when he takes the iron tablets.  Not taking a PPI.  Not aware that he is taking a B12 supplement.  Not taking any NSAIDs.  Only complaint is a lot of gas. Current Outpatient Medications  Medication Sig Dispense Refill  . atorvastatin (LIPITOR) 40 MG tablet Take 1 tablet (40 mg total) by mouth daily. 90 tablet 1  . cyclobenzaprine (FLEXERIL) 10 MG tablet Take 0.5-1 tablets (5-10 mg total) by mouth 3 (three) times daily as needed for muscle spasms (muscle tension). (Patient not taking: Reported on 02/10/2019) 30 tablet 0  . ferrous sulfate 325 (65 FE) MG EC tablet Take 1 tablet (325 mg total) by mouth daily with breakfast. Take every other day if cannot tolerate daily 90 tablet 1  . pantoprazole (PROTONIX) 40 MG tablet Take 1 tablet (40 mg total) by mouth 2 (two) times daily. 120 tablet 1  . sildenafil (VIAGRA) 25 MG tablet Take 1 tablet (25 mg total) by mouth  daily as needed for erectile dysfunction. 30 tablet 1   No current facility-administered medications for this visit.    Allergies as of 04/07/2019 - Review Complete 03/14/2019  Allergen Reaction Noted  . 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 ENT: Negative for hoarseness, difficulty swallowing , nasal congestion. CV: Negative for chest pain, angina, palpitations, dyspnea on exertion, peripheral edema.  Respiratory: Negative for dyspnea at rest, dyspnea on exertion, cough, sputum, wheezing.  GI: See history of present illness. GU:  Negative for dysuria, hematuria, urinary incontinence, urinary frequency, nocturnal urination.  Endo: Negative for unusual weight change.    Physical Examination:   There were no vitals taken for this visit.  General: Well-nourished, well-developed in no acute distress.  Eyes: No icterus. Conjunctivae pink. Mouth: Oropharyngeal mucosa moist and pink , no lesions erythema or exudate. Lungs: Clear to auscultation bilaterally. Non-labored. Heart: Regular rate and rhythm, no murmurs rubs or gallops.  Abdomen: Bowel sounds are normal, nontender, nondistended, no hepatosplenomegaly or masses, no abdominal bruits or hernia , no rebound or guarding.   Extremities: No lower extremity edema. No clubbing or deformities. Neuro: Alert and oriented x 3.  Grossly intact. Skin: Warm and dry, no jaundice.   Psych: Alert and cooperative, normal mood and affect.  Imaging Studies: No results found.  Assessment and Plan:   Paul Ingram is a 64 y.o. y/o male recently discharged from the hospital in November 2020 when he presented with a short duration of melena while being on NSAIDs.  Found to have a very low hemoglobin.  Upper endoscopy demonstrated 2 clean-based ulcers of the duodenum.  Was also found to have a very low B12 level.  Not on any B12 supplement.  Plan 1.  H.  pylori breath test in view of duodenal ulcer 2.  Commence on B12 1000 mcg a day.  Recheck CBC, iron studies, B12, intrinsic factor antibody and antiparietal cell antibody.  If B12 levels are still low would require intramuscular injections.  If iron levels are low will require replacement of iron. 3.  Continue PPI for 4 weeks 4.  Avoid all NSAIDs 5.  Low FODMAP diet for gas.  Trial of Gas-X.   Dr Jonathon Bellows  MD,MRCP [facility]) Follow up in 6-8 weeks

## 2019-04-07 NOTE — Addendum Note (Signed)
Addended by: Dorethea Clan on: 04/07/2019 01:39 PM   Modules accepted: Orders

## 2019-04-08 LAB — CBC WITH DIFFERENTIAL/PLATELET
Basophils Absolute: 0 10*3/uL (ref 0.0–0.2)
Basos: 0 %
EOS (ABSOLUTE): 0.2 10*3/uL (ref 0.0–0.4)
Eos: 3 %
Hematocrit: 40.7 % (ref 37.5–51.0)
Hemoglobin: 13.2 g/dL (ref 13.0–17.7)
Immature Grans (Abs): 0 10*3/uL (ref 0.0–0.1)
Immature Granulocytes: 0 %
Lymphocytes Absolute: 1.8 10*3/uL (ref 0.7–3.1)
Lymphs: 27 %
MCH: 28.6 pg (ref 26.6–33.0)
MCHC: 32.4 g/dL (ref 31.5–35.7)
MCV: 88 fL (ref 79–97)
Monocytes Absolute: 0.7 10*3/uL (ref 0.1–0.9)
Monocytes: 11 %
Neutrophils Absolute: 3.9 10*3/uL (ref 1.4–7.0)
Neutrophils: 59 %
Platelets: 268 10*3/uL (ref 150–450)
RBC: 4.61 x10E6/uL (ref 4.14–5.80)
RDW: 14.8 % (ref 11.6–15.4)
WBC: 6.7 10*3/uL (ref 3.4–10.8)

## 2019-04-08 LAB — IRON,TIBC AND FERRITIN PANEL
Ferritin: 35 ng/mL (ref 30–400)
Iron Saturation: 69 % — ABNORMAL HIGH (ref 15–55)
Iron: 273 ug/dL (ref 38–169)
Total Iron Binding Capacity: 394 ug/dL (ref 250–450)
UIBC: 121 ug/dL (ref 111–343)

## 2019-04-08 LAB — H. PYLORI BREATH TEST: H pylori Breath Test: POSITIVE — AB

## 2019-04-08 LAB — INTRINSIC FACTOR ANTIBODIES: Intrinsic Factor Abs, Serum: 1.1 AU/mL (ref 0.0–1.1)

## 2019-04-08 LAB — B12 AND FOLATE PANEL
Folate: 10.9 ng/mL (ref >3.0)
Vitamin B-12: 264 pg/mL (ref 232–1245)

## 2019-04-08 LAB — ANTI-PARIETAL ANTIBODY: Parietal Cell Ab: 1.4 Units (ref 0.0–20.0)

## 2019-04-11 ENCOUNTER — Encounter: Payer: Self-pay | Admitting: Gastroenterology

## 2019-04-14 ENCOUNTER — Telehealth: Payer: Self-pay

## 2019-04-14 NOTE — Telephone Encounter (Signed)
-----   Message from Jonathon Bellows, MD sent at 04/11/2019  8:27 AM EST ----- Paul Ingram please inform   1. Continue b12 still low normal  2. Hb is normal 3. Can stop iron since iron is normal 4. H pylori positive   Suggest clarithromycin 500 mg PO BID, amoxicillin 1 gram TID, omeprazole 20 mg BID all for 14 days.  , check for penicillin allergy and will need repeat H pylori stool antigen to check for eradication after .   Dr Vicente Males

## 2019-04-14 NOTE — Telephone Encounter (Signed)
Called pt to inform him of lab results and Dr. Anna's recommendations.  Unable to contact, LVM to return call 

## 2019-04-16 ENCOUNTER — Other Ambulatory Visit: Payer: Self-pay

## 2019-04-16 DIAGNOSIS — A048 Other specified bacterial intestinal infections: Secondary | ICD-10-CM

## 2019-04-16 MED ORDER — CLARITHROMYCIN 500 MG PO TABS: 500.0000 mg | ORAL_TABLET | Freq: Two times a day (BID) | ORAL | 0 refills | Status: AC

## 2019-04-16 MED ORDER — AMOXICILLIN 500 MG PO CAPS: 1000.0000 mg | ORAL_CAPSULE | Freq: Three times a day (TID) | ORAL | 0 refills | Status: AC

## 2019-04-16 NOTE — Telephone Encounter (Signed)
-----   Message from Jonathon Bellows, MD sent at 04/11/2019  8:27 AM EST ----- Sherald Hess please inform   1. Continue b12 still low normal  2. Hb is normal 3. Can stop iron since iron is normal 4. H pylori positive   Suggest clarithromycin 500 mg PO BID, amoxicillin 1 gram TID, omeprazole 20 mg BID all for 14 days.  , check for penicillin allergy and will need repeat H pylori stool antigen to check for eradication after .   Dr Vicente Males

## 2019-04-16 NOTE — Telephone Encounter (Signed)
-----   Message from Jonathon Bellows, MD sent at 04/11/2019  8:27 AM EST ----- Paul Ingram please inform   1. Continue b12 still low normal  2. Hb is normal 3. Can stop iron since iron is normal 4. H pylori positive   Suggest clarithromycin 500 mg PO BID, amoxicillin 1 gram TID, omeprazole 20 mg BID all for 14 days.  , check for penicillin allergy and will need repeat H pylori stool antigen to check for eradication after .   Dr Vicente Males

## 2019-04-16 NOTE — Telephone Encounter (Signed)
Called pt again to inform him of lab results and Dr. Georgeann Oppenheim plan of treatment.  Unable to contact, LVM to return call Pt has no one on DPR, will send letter.

## 2019-04-16 NOTE — Telephone Encounter (Signed)
Spoke with pt and informed him of lab results and Dr.Anna's plan. Pt understands and agrees. Prescriptions have been sent to pt's preferred pharmacy.

## 2019-05-05 ENCOUNTER — Telehealth: Payer: Self-pay | Admitting: Gastroenterology

## 2019-05-05 NOTE — Telephone Encounter (Signed)
Pt is calling he has been trying to reach [place] regarding his medication he finished  For 14 days and he is supposed to do a breath test please call pt

## 2019-05-06 ENCOUNTER — Other Ambulatory Visit: Payer: Self-pay

## 2019-05-06 DIAGNOSIS — A048 Other specified bacterial intestinal infections: Secondary | ICD-10-CM

## 2019-05-06 NOTE — Telephone Encounter (Signed)
Spoke with pt and informed him that Dr. Vicente Males ordered the H pylori stool antigen. I explained the he would need to visit the lab to pick up the stool test kit, pt agrees to visit our office lab today.

## 2019-05-09 DIAGNOSIS — A048 Other specified bacterial intestinal infections: Secondary | ICD-10-CM | POA: Diagnosis not present

## 2019-05-11 LAB — H. PYLORI ANTIGEN, STOOL: H pylori Ag, Stl: NEGATIVE

## 2019-05-12 ENCOUNTER — Encounter: Payer: Self-pay | Admitting: Gastroenterology

## 2019-05-22 ENCOUNTER — Other Ambulatory Visit: Payer: Self-pay

## 2019-05-22 ENCOUNTER — Ambulatory Visit: Payer: BC Managed Care – PPO | Admitting: Gastroenterology

## 2019-05-22 VITALS — BP 168/104 | HR 83 | Temp 98.3°F | Ht 72.0 in | Wt 228.0 lb

## 2019-05-22 DIAGNOSIS — Z86010 Personal history of colonic polyps: Secondary | ICD-10-CM | POA: Diagnosis not present

## 2019-05-22 DIAGNOSIS — R140 Abdominal distension (gaseous): Secondary | ICD-10-CM | POA: Diagnosis not present

## 2019-05-22 DIAGNOSIS — E538 Deficiency of other specified B group vitamins: Secondary | ICD-10-CM | POA: Diagnosis not present

## 2019-05-22 NOTE — Progress Notes (Signed)
Jonathon Bellows MD, MRCP(U.K) [address]  [place]  [place], [place] [address]  Main: [phone]  Fax: [phone]   Primary Care Physician: Delsa Grana, PA-C  Primary Gastroenterologist:  Dr. Jonathon Bellows   Chief Complaint  Patient presents with  . Follow-up    B12 Deficiency, Bloating    HPI: Paul Ingram is a 64 y.o. male    Summary of history :  Paul Ingram is a 64 y.o. male was admitted to the hospital on 02/06/2019 and was seen by .  At that time he presented with shortness of breath of 1 day duration and was noted to have a hemoglobin of 7.3 g and an MCV of 99.6.  IMA globin was 14.9 g a year prior.  Iron studies were normal but B12 was very low.  He had been taking Advil for shoulder pain for years and noticed for a week that he had been having tarry black stools.  He underwent an upper endoscopy on 02/07/2019 and was found to have 2 nonbleeding ulcers of the duodenum with clean base.  Advised to stop all NSAIDs and follow-up in the GI clinic. 03/14/2019: Hemoglobin 11.7 g and MCV of 89.2.   Interval history 04/07/2019-05/22/2019 04/07/2019: H. pylori breath test positive.  Subsequently treated and on repeat checking eradication confirmed  04/07/2019: B12 improved from 91-264 with a normal folate.  Hemoglobin 13.2 g.  Negative intrinsic factor and antiparietal cell antibody.  Last colonoscopy was in March 2017 5 tubular adenomas were resected. He is doing well since last visit.  Having brown stools.  Not taking any NSAIDs.  Has gas and bloating.  He does admit drinking a lot of soda.  Advised him to cut down on the same.  He could not recollect if he still taking the B12 tablets I suggested him to take it again and I will provide him with a new prescription.  He has stopped taking the iron supplements.   Current Outpatient Medications  Medication Sig Dispense Refill  . atorvastatin (LIPITOR) 40 MG tablet Take 1 tablet (40 mg total) by mouth daily. 90 tablet  1  . cyclobenzaprine (FLEXERIL) 10 MG tablet Take 0.5-1 tablets (5-10 mg total) by mouth 3 (three) times daily as needed for muscle spasms (muscle tension). (Patient not taking: Reported on 02/10/2019) 30 tablet 0  . ferrous sulfate 325 (65 FE) MG EC tablet Take 1 tablet (325 mg total) by mouth daily with breakfast. Take every other day if cannot tolerate daily 90 tablet 1  . omeprazole (PRILOSEC) 40 MG capsule Take 1 capsule (40 mg total) by mouth daily. 90 capsule 1  . pantoprazole (PROTONIX) 40 MG tablet Take 1 tablet (40 mg total) by mouth 2 (two) times daily. 120 tablet 1  . sildenafil (VIAGRA) 25 MG tablet Take 1 tablet (25 mg total) by mouth daily as needed for erectile dysfunction. 30 tablet 1  . vitamin B-12 (CYANOCOBALAMIN) 1000 MCG tablet Take 1 tablet (1,000 mcg total) by mouth daily. 90 tablet 0   No current facility-administered medications for this visit.    Allergies as of 05/22/2019 - Review Complete 05/22/2019  Allergen Reaction Noted  . 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 ENT: Negative for hoarseness, difficulty swallowing , nasal congestion. CV: Negative for chest pain, angina, palpitations, dyspnea on exertion, peripheral edema.  Respiratory: Negative for dyspnea at rest, dyspnea on exertion, cough, sputum, wheezing.  GI:  See history of present illness. GU:  Negative for dysuria, hematuria, urinary incontinence, urinary frequency, nocturnal urination.  Endo: Negative for unusual weight change.    Physical Examination:   BP (!) 168/104   Pulse 83   Temp 98.3 F (36.8 C)   Ht 6' (1.829 m)   Wt 228 lb (103.4 kg)   BMI 30.92 kg/m   General: Well-nourished, well-developed in no acute distress.  Eyes: No icterus. Conjunctivae pink. Mouth: Oropharyngeal mucosa moist and pink , no lesions erythema or exudate. Lungs: Clear to auscultation bilaterally. Non-labored. Heart:  Regular rate and rhythm, no murmurs rubs or gallops.  Abdomen: Bowel sounds are normal, nontender, nondistended, no hepatosplenomegaly or masses, no abdominal bruits or hernia , no rebound or guarding.   Extremities: No lower extremity edema. No clubbing or deformities. Neuro: Alert and oriented x 3.  Grossly intact. Skin: Warm and dry, no jaundice.   Psych: Alert and cooperative, normal mood and affect.   Imaging Studies: No results found.  Assessment and Plan:   Paul Ingram is a 64 y.o. y/o male here to follow-up with a recent history of an upper GI bleed secondary to NSAID related ulcers in the duodenum.  Also found to have very low B12 levels. Was positive for H. pylori and has been treated to eradication.  History of tubular adenomas of the colon due for a colonoscopy.   Plan 1.  Continue B12 supplement.  Hemoglobin is returned to normal.  Can stop iron tablets and recheck CBC in 3 months by Delsa Grana, PA-C . 2. Avoid all NSAIDs 3.  Low FODMAP diet and avoid soda asked to avoid issues with gas. 4.  Personal history of colon polyps tubular adenomas in 2017 due for surveillance colonoscopy.   I have discussed alternative options, risks & benefits,  which include, but are not limited to, bleeding, infection, perforation,respiratory complication & drug reaction.  The patient agrees with this plan & written consent will be obtained.     I have discussed alternative options, risks & benefits,  which include, but are not limited to, bleeding, infection, perforation,respiratory complication & drug reaction.  The patient agrees with this plan & written consent will be obtained.    Dr Jonathon Bellows  MD,MRCP [facility]) Follow up as needed

## 2019-06-06 ENCOUNTER — Other Ambulatory Visit
Admission: RE | Admit: 2019-06-06 | Discharge: 2019-06-06 | Disposition: A | Discharge status: Home / Self Care | Payer: BC Managed Care – PPO | Source: Ambulatory Visit | Attending: Gastroenterology | Admitting: Gastroenterology

## 2019-06-06 DIAGNOSIS — Z20822 Contact with and (suspected) exposure to covid-19: Secondary | ICD-10-CM | POA: Insufficient documentation

## 2019-06-06 DIAGNOSIS — Z01812 Encounter for preprocedural laboratory examination: Secondary | ICD-10-CM | POA: Diagnosis not present

## 2019-06-07 LAB — SARS CORONAVIRUS 2 (TAT 6-24 HRS): SARS Coronavirus 2: NEGATIVE

## 2019-06-09 ENCOUNTER — Telehealth: Payer: Self-pay

## 2019-06-09 NOTE — Telephone Encounter (Signed)
Patient has been contacted to rescheduled his colonoscopy for tomorrow.  He ate a fish sandwich today.  I reviewed the colonoscopy dietary prep emphasizing clear liquid diet, and explained to him that we will need to reschedule his colonoscopy since he ate solid foods.  Will send him new instructions to reflect his new procedure date of Thursday 06/26/19.  Advised also that he will need to have a repeat COVID test. He has been asked to please call the office if he is unsure of his colonoscopy instructions.

## 2019-06-10 ENCOUNTER — Ambulatory Visit: Payer: BC Managed Care – PPO | Admitting: Family Medicine

## 2019-06-24 ENCOUNTER — Other Ambulatory Visit: Payer: BC Managed Care – PPO | Attending: Gastroenterology

## 2019-06-25 ENCOUNTER — Telehealth: Payer: Self-pay

## 2019-06-25 ENCOUNTER — Other Ambulatory Visit: Payer: Self-pay | Admitting: Gastroenterology

## 2019-06-25 ENCOUNTER — Other Ambulatory Visit: Payer: Self-pay

## 2019-06-25 ENCOUNTER — Other Ambulatory Visit
Admission: RE | Admit: 2019-06-25 | Discharge: 2019-06-25 | Disposition: A | Discharge status: Home / Self Care | Payer: BC Managed Care – PPO | Source: Ambulatory Visit | Attending: Gastroenterology | Admitting: Gastroenterology

## 2019-06-25 DIAGNOSIS — Z01812 Encounter for preprocedural laboratory examination: Secondary | ICD-10-CM | POA: Insufficient documentation

## 2019-06-25 DIAGNOSIS — Z20822 Contact with and (suspected) exposure to covid-19: Secondary | ICD-10-CM | POA: Diagnosis not present

## 2019-06-25 LAB — SARS CORONAVIRUS 2 (TAT 6-24 HRS): SARS Coronavirus 2: NEGATIVE

## 2019-06-25 NOTE — Telephone Encounter (Signed)
LVM for pt to remind him to go for his COVID test this am between 8am-12pm in order to keep his colonoscopy as scheduled for tomorrow.  Also reminded him to begin his clear liquid diet today, and bowel prep at 5pm.  Thanks,  [facility]

## 2019-06-25 NOTE — Telephone Encounter (Signed)
Patient came in to the office an ask for a refill on Omeprazole dr 40mg  capsule. Please send to [facility]

## 2019-06-26 ENCOUNTER — Other Ambulatory Visit: Payer: Self-pay

## 2019-06-26 ENCOUNTER — Ambulatory Visit
Admission: RE | Admit: 2019-06-26 | Discharge: 2019-06-26 | Disposition: A | Discharge status: Home / Self Care | Payer: BC Managed Care – PPO | Attending: Gastroenterology | Admitting: Gastroenterology

## 2019-06-26 ENCOUNTER — Encounter
Admission: RE | Disposition: A | Discharge status: Home / Self Care | Payer: Self-pay | Source: Home / Self Care | Attending: Gastroenterology

## 2019-06-26 ENCOUNTER — Ambulatory Visit: Payer: BC Managed Care – PPO | Admitting: Anesthesiology

## 2019-06-26 ENCOUNTER — Encounter: Payer: Self-pay | Admitting: Gastroenterology

## 2019-06-26 DIAGNOSIS — Z79899 Other long term (current) drug therapy: Secondary | ICD-10-CM | POA: Insufficient documentation

## 2019-06-26 DIAGNOSIS — I10 Essential (primary) hypertension: Secondary | ICD-10-CM | POA: Insufficient documentation

## 2019-06-26 DIAGNOSIS — Z1211 Encounter for screening for malignant neoplasm of colon: Secondary | ICD-10-CM | POA: Diagnosis not present

## 2019-06-26 DIAGNOSIS — D120 Benign neoplasm of cecum: Secondary | ICD-10-CM | POA: Diagnosis not present

## 2019-06-26 DIAGNOSIS — D124 Benign neoplasm of descending colon: Secondary | ICD-10-CM | POA: Insufficient documentation

## 2019-06-26 DIAGNOSIS — F17210 Nicotine dependence, cigarettes, uncomplicated: Secondary | ICD-10-CM | POA: Diagnosis not present

## 2019-06-26 DIAGNOSIS — E785 Hyperlipidemia, unspecified: Secondary | ICD-10-CM | POA: Diagnosis not present

## 2019-06-26 DIAGNOSIS — D123 Benign neoplasm of transverse colon: Secondary | ICD-10-CM | POA: Insufficient documentation

## 2019-06-26 DIAGNOSIS — E669 Obesity, unspecified: Secondary | ICD-10-CM | POA: Diagnosis not present

## 2019-06-26 DIAGNOSIS — K635 Polyp of colon: Secondary | ICD-10-CM | POA: Diagnosis not present

## 2019-06-26 DIAGNOSIS — Z6833 Body mass index (BMI) 33.0-33.9, adult: Secondary | ICD-10-CM | POA: Diagnosis not present

## 2019-06-26 DIAGNOSIS — Z86010 Personal history of colonic polyps: Secondary | ICD-10-CM | POA: Insufficient documentation

## 2019-06-26 DIAGNOSIS — K219 Gastro-esophageal reflux disease without esophagitis: Secondary | ICD-10-CM | POA: Diagnosis not present

## 2019-06-26 HISTORY — PX: COLONOSCOPY WITH PROPOFOL: SHX5780

## 2019-06-26 SURGERY — COLONOSCOPY WITH PROPOFOL
Anesthesia: General

## 2019-06-26 MED ORDER — LIDOCAINE HCL (CARDIAC) PF 100 MG/5ML IV SOSY
PREFILLED_SYRINGE | INTRAVENOUS | Status: DC | PRN
  Administered 2019-06-26: 50 mg via INTRAVENOUS

## 2019-06-26 MED ORDER — LABETALOL HCL 5 MG/ML IV SOLN
INTRAVENOUS | Status: AC
  Filled 2019-06-26: qty 4

## 2019-06-26 MED ORDER — SODIUM CHLORIDE 0.9 % IV SOLN: INTRAVENOUS | Status: DC

## 2019-06-26 MED ORDER — PROPOFOL 10 MG/ML IV BOLUS
INTRAVENOUS | Status: DC | PRN
  Administered 2019-06-26: 70 mg via INTRAVENOUS

## 2019-06-26 MED ORDER — OMEPRAZOLE 40 MG PO CPDR: 40.0000 mg | DELAYED_RELEASE_CAPSULE | Freq: Every day | ORAL | 1 refills | Status: DC

## 2019-06-26 MED ORDER — LABETALOL HCL 5 MG/ML IV SOLN
10.0000 mg | Freq: Once | INTRAVENOUS | Status: AC
  Administered 2019-06-26: 10 mg via INTRAVENOUS
  Filled 2019-06-26: qty 4

## 2019-06-26 MED ORDER — PROPOFOL 500 MG/50ML IV EMUL
INTRAVENOUS | Status: DC | PRN
  Administered 2019-06-26: 175 ug/kg/min via INTRAVENOUS

## 2019-06-26 NOTE — OR Nursing (Signed)
BP ELEVATED. DR FITSGERALD ORDERED 10 MG OF LABETOLOL AND IT WAS ADM. PT ENCOURAGED TO FOLLOW UP WITH FAMILY DOCTOR.

## 2019-06-26 NOTE — Anesthesia Postprocedure Evaluation (Signed)
Anesthesia Post Note  Patient: Paul Ingram  Procedure(s) Performed: COLONOSCOPY WITH PROPOFOL (N/A )  Patient location during evaluation: Endoscopy Anesthesia Type: General Level of consciousness: awake and alert Pain management: pain level controlled Vital Signs Assessment: post-procedure vital signs reviewed and stable Respiratory status: spontaneous breathing, nonlabored ventilation and respiratory function stable Cardiovascular status: blood pressure returned to baseline and stable Postop Assessment: no apparent nausea or vomiting Anesthetic complications: no     Last Vitals:  Vitals:   06/26/19 1200 06/26/19 1210  BP: 137/86 (!) 147/99  Pulse: 69 72  Resp: 16 17  Temp:    SpO2: 100% 99%    Last Pain:  Vitals:   06/26/19 1100  TempSrc: Temporal  PainSc:                  [name]
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
## 2019-06-26 NOTE — OR Nursing (Signed)
BP 137/86

## 2019-06-26 NOTE — Anesthesia Procedure Notes (Signed)
Date/Time: 06/26/2019 10:26 AM Performed by: Johnna Acosta, CRNA Pre-anesthesia Checklist: Patient identified, Emergency Drugs available, Suction available, Patient being monitored and Timeout performed Patient Re-evaluated:Patient Re-evaluated prior to induction Oxygen Delivery Method: Nasal cannula Preoxygenation: Pre-oxygenation with 100% oxygen Induction Type: IV induction

## 2019-06-26 NOTE — Telephone Encounter (Signed)
Precription for Omeprazole has been sent to pt's pharmacy.

## 2019-06-26 NOTE — Transfer of Care (Signed)
Immediate Anesthesia Transfer of Care Note  Patient: Paul Ingram  Procedure(s) Performed: COLONOSCOPY WITH PROPOFOL (N/A )  Patient Location: PACU  Anesthesia Type:General  Level of Consciousness: sedated  Airway & Oxygen Therapy: Patient Spontanous Breathing  Post-op Assessment: Report given to RN and Post -op Vital signs reviewed and stable  Post vital signs: Reviewed and stable  Last Vitals:  Vitals Value Taken Time  BP 116/86 06/26/19 1101  Temp 36.3 C 06/26/19 1100  Pulse 88 06/26/19 1102  Resp 12 06/26/19 1102  SpO2 98 % 06/26/19 1102  Vitals shown include unvalidated device data.  Last Pain:  Vitals:   06/26/19 1100  TempSrc: Temporal  PainSc:          Complications: No apparent anesthesia complications

## 2019-06-26 NOTE — Op Note (Signed)
[facility] Gastroenterology Patient Name: Paul Ingram Procedure Date: 06/26/2019 10:23 AM MRN: [id number] Account #: [id number] Date of Birth: Apr 09, 1955 Admit Type: Outpatient Age: 64 Room: [facility] ENDO ROOM 1 Gender: Male Note Status: Finalized Procedure:             Colonoscopy Indications:           High risk colon cancer surveillance: Personal history                         of colonic polyps Providers:             Jonathon Bellows MD, MD Referring MD:          Delsa Grana (Referring MD) Medicines:             Monitored Anesthesia Care Complications:         No immediate complications. Procedure:             Pre-Anesthesia Assessment:                        - Prior to the procedure, a History and Physical was                         performed, and patient medications, allergies and                         sensitivities were reviewed. The patient's tolerance                         of previous anesthesia was reviewed.                        - The risks and benefits of the procedure and the                         sedation options and risks were discussed with the                         patient. All questions were answered and informed                         consent was obtained.                        - ASA Grade Assessment: II - A patient with mild                         systemic disease.                        After obtaining informed consent, the colonoscope was                         passed under direct vision. Throughout the procedure,                         the patient's blood pressure, pulse, and oxygen                         saturations were monitored continuously. The  Colonoscope was introduced through the anus and                         advanced to the the cecum, identified by the                         appendiceal orifice. The colonoscopy was performed                         with ease. The patient tolerated the procedure  well.                         The quality of the bowel preparation was excellent. Findings:      The perianal and digital rectal examinations were normal.      A 4 mm polyp was found in the cecum. The polyp was sessile. The polyp       was removed with a cold snare. Resection and retrieval were complete.      Three sessile polyps were found in the transverse colon. The polyps were       4 to 7 mm in size. These polyps were removed with a cold snare.       Resection and retrieval were complete.      Two sessile polyps were found in the descending colon. The polyps were 4       to 6 mm in size. These polyps were removed with a cold snare. Resection       and retrieval were complete.      The exam was otherwise without abnormality on direct and retroflexion       views. Impression:            - One 4 mm polyp in the cecum, removed with a cold                         snare. Resected and retrieved.                        - Three 4 to 7 mm polyps in the transverse colon,                         removed with a cold snare. Resected and retrieved.                        - Two 4 to 6 mm polyps in the descending colon,                         removed with a cold snare. Resected and retrieved.                        - The examination was otherwise normal on direct and                         retroflexion views. Recommendation:        - Discharge patient to home (with escort).                        - Resume previous diet.                        -  Continue present medications.                        - Await pathology results.                        - Repeat colonoscopy in 3 years for surveillance. Procedure Code(s):     --- Professional ---                        [phone], Colonoscopy, flexible; with removal of                         tumor(s), polyp(s), or other lesion(s) by snare                         technique Diagnosis Code(s):     --- Professional ---                        K63.5, Polyp of  colon                        Z86.010, Personal history of colonic polyps CPT copyright 2019 American [employer] Association. All rights reserved. The codes documented in this report are preliminary and upon coder review may  be revised to meet current compliance requirements. Jonathon Bellows, MD Jonathon Bellows MD, MD 06/26/2019 10:56:33 AM This report has been signed electronically. Number of Addenda: 0 Note Initiated On: 06/26/2019 10:23 AM Scope Withdrawal Time: 0 hours 10 minutes 3 seconds  Total Procedure Duration: 0 hours 12 minutes 17 seconds  Estimated Blood Loss:  Estimated blood loss: none.      [facility]

## 2019-06-27 ENCOUNTER — Encounter: Payer: Self-pay | Admitting: Gastroenterology

## 2019-06-27 LAB — SURGICAL PATHOLOGY

## 2019-07-03 DIAGNOSIS — M25521 Pain in right elbow: Secondary | ICD-10-CM | POA: Diagnosis not present

## 2019-08-21 ENCOUNTER — Other Ambulatory Visit: Payer: Self-pay | Admitting: Family Medicine
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
## 2019-08-28 ENCOUNTER — Ambulatory Visit (INDEPENDENT_AMBULATORY_CARE_PROVIDER_SITE_OTHER): Payer: BC Managed Care – PPO | Admitting: Internal Medicine

## 2019-08-28 ENCOUNTER — Other Ambulatory Visit: Payer: Self-pay

## 2019-08-28 ENCOUNTER — Encounter: Payer: Self-pay | Admitting: Internal Medicine

## 2019-08-28 VITALS — BP 136/88 | HR 96 | Temp 98.1°F | Resp 16 | Ht 72.0 in | Wt 221.2 lb

## 2019-08-28 DIAGNOSIS — K635 Polyp of colon: Secondary | ICD-10-CM | POA: Diagnosis not present

## 2019-08-28 DIAGNOSIS — N529 Male erectile dysfunction, unspecified: Secondary | ICD-10-CM

## 2019-08-28 DIAGNOSIS — E782 Mixed hyperlipidemia: Secondary | ICD-10-CM | POA: Diagnosis not present

## 2019-08-28 DIAGNOSIS — Z6830 Body mass index (BMI) 30.0-30.9, adult: Secondary | ICD-10-CM

## 2019-08-28 DIAGNOSIS — Z5181 Encounter for therapeutic drug level monitoring: Secondary | ICD-10-CM

## 2019-08-28 DIAGNOSIS — E66811 Obesity, class 1: Secondary | ICD-10-CM

## 2019-08-28 DIAGNOSIS — I10 Essential (primary) hypertension: Secondary | ICD-10-CM

## 2019-08-28 DIAGNOSIS — E6609 Other obesity due to excess calories: Secondary | ICD-10-CM

## 2019-08-28 DIAGNOSIS — G4733 Obstructive sleep apnea (adult) (pediatric): Secondary | ICD-10-CM | POA: Insufficient documentation

## 2019-08-28 MED ORDER — ATORVASTATIN CALCIUM 40 MG PO TABS: 40.0000 mg | ORAL_TABLET | Freq: Every day | ORAL | 1 refills | Status: DC

## 2019-08-28 MED ORDER — SILDENAFIL CITRATE 20 MG PO TABS: ORAL_TABLET | ORAL | 5 refills | Status: DC

## 2019-08-28 MED ORDER — AMLODIPINE BESYLATE 5 MG PO TABS: 5.0000 mg | ORAL_TABLET | Freq: Every day | ORAL | 3 refills | Status: DC

## 2019-08-28 NOTE — Patient Instructions (Signed)

## 2019-10-06 DIAGNOSIS — K529 Noninfective gastroenteritis and colitis, unspecified: Secondary | ICD-10-CM | POA: Diagnosis not present

## 2019-10-06 DIAGNOSIS — R252 Cramp and spasm: Secondary | ICD-10-CM | POA: Diagnosis not present

## 2019-10-06 DIAGNOSIS — R112 Nausea with vomiting, unspecified: Secondary | ICD-10-CM | POA: Diagnosis not present

## 2019-10-06 DIAGNOSIS — R079 Chest pain, unspecified: Secondary | ICD-10-CM | POA: Diagnosis not present

## 2019-10-06 DIAGNOSIS — R1084 Generalized abdominal pain: Secondary | ICD-10-CM | POA: Diagnosis not present

## 2019-10-07 DIAGNOSIS — R112 Nausea with vomiting, unspecified: Secondary | ICD-10-CM | POA: Diagnosis not present

## 2019-10-07 DIAGNOSIS — R252 Cramp and spasm: Secondary | ICD-10-CM | POA: Diagnosis not present

## 2019-10-14 ENCOUNTER — Encounter: Payer: Self-pay | Admitting: Internal Medicine

## 2019-10-14 ENCOUNTER — Ambulatory Visit (INDEPENDENT_AMBULATORY_CARE_PROVIDER_SITE_OTHER): Payer: BC Managed Care – PPO | Admitting: Internal Medicine

## 2019-10-14 ENCOUNTER — Other Ambulatory Visit: Payer: Self-pay

## 2019-10-14 VITALS — BP 132/88 | HR 91 | Temp 98.3°F | Resp 18 | Ht 72.0 in | Wt 223.2 lb

## 2019-10-14 DIAGNOSIS — N529 Male erectile dysfunction, unspecified: Secondary | ICD-10-CM | POA: Diagnosis not present

## 2019-10-14 DIAGNOSIS — E6609 Other obesity due to excess calories: Secondary | ICD-10-CM | POA: Diagnosis not present

## 2019-10-14 DIAGNOSIS — E66811 Obesity, class 1: Secondary | ICD-10-CM

## 2019-10-14 DIAGNOSIS — I10 Essential (primary) hypertension: Secondary | ICD-10-CM

## 2019-10-14 DIAGNOSIS — E782 Mixed hyperlipidemia: Secondary | ICD-10-CM | POA: Diagnosis not present

## 2019-10-14 DIAGNOSIS — G4733 Obstructive sleep apnea (adult) (pediatric): Secondary | ICD-10-CM

## 2019-10-14 DIAGNOSIS — Z6830 Body mass index (BMI) 30.0-30.9, adult: Secondary | ICD-10-CM
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
## 2019-10-14 NOTE — Patient Instructions (Signed)
Stop taking meloxicam.
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
## 2019-10-30 ENCOUNTER — Other Ambulatory Visit: Payer: Self-pay

## 2019-10-30 ENCOUNTER — Telehealth: Payer: Self-pay

## 2019-10-30 ENCOUNTER — Encounter: Payer: Self-pay | Admitting: Internal Medicine

## 2019-10-30 ENCOUNTER — Ambulatory Visit (INDEPENDENT_AMBULATORY_CARE_PROVIDER_SITE_OTHER): Payer: BC Managed Care – PPO | Admitting: Internal Medicine

## 2019-10-30 VITALS — BP 150/92 | HR 81 | Temp 98.0°F | Resp 16 | Ht 68.5 in | Wt 221.2 lb

## 2019-10-30 DIAGNOSIS — Z6830 Body mass index (BMI) 30.0-30.9, adult: Secondary | ICD-10-CM

## 2019-10-30 DIAGNOSIS — I10 Essential (primary) hypertension: Secondary | ICD-10-CM

## 2019-10-30 DIAGNOSIS — E782 Mixed hyperlipidemia: Secondary | ICD-10-CM

## 2019-10-30 DIAGNOSIS — R251 Tremor, unspecified: Secondary | ICD-10-CM | POA: Diagnosis not present

## 2019-10-30 DIAGNOSIS — F321 Major depressive disorder, single episode, moderate: Secondary | ICD-10-CM

## 2019-10-30 DIAGNOSIS — R252 Cramp and spasm: Secondary | ICD-10-CM

## 2019-10-30 DIAGNOSIS — R5383 Other fatigue: Secondary | ICD-10-CM

## 2019-10-30 DIAGNOSIS — E6609 Other obesity due to excess calories: Secondary | ICD-10-CM

## 2019-10-30 DIAGNOSIS — E66811 Other obesity due to excess calories: Secondary | ICD-10-CM

## 2019-10-30 DIAGNOSIS — G4733 Obstructive sleep apnea (adult) (pediatric): Secondary | ICD-10-CM

## 2019-10-30 DIAGNOSIS — G4700 Insomnia, unspecified: Secondary | ICD-10-CM

## 2019-10-30 NOTE — Patient Instructions (Addendum)
Referral was provided today for pulmonary/sleep clinic for sleep apnea management  Stay well-hydrated.  Feel best stopping the atorvastatin product presently  Do recommend seeing counseling, and asked that you contact your insurance to ensure the 1 chosen from the list provided has some coverage help from your insurance  Labs were ordered today as well, and await those results.

## 2019-10-30 NOTE — Telephone Encounter (Signed)
Patient stopped back by today wanting to get work note to be out for the next 2 weeks?  FMLA

## 2019-10-31 ENCOUNTER — Telehealth: Payer: Self-pay

## 2019-10-31 LAB — CBC WITH DIFFERENTIAL/PLATELET
Absolute Monocytes: 710 cells/uL (ref 200–950)
Basophils Absolute: 42 cells/uL (ref 0–200)
Basophils Relative: 0.8 %
Eosinophils Absolute: 122 cells/uL (ref 15–500)
Eosinophils Relative: 2.3 %
HCT: 43.7 % (ref 38.5–50.0)
Hemoglobin: 14.7 g/dL (ref 13.2–17.1)
Lymphs Abs: 1844 cells/uL (ref 850–3900)
MCH: 31.5 pg (ref 27.0–33.0)
MCHC: 33.6 g/dL (ref 32.0–36.0)
MCV: 93.6 fL (ref 80.0–100.0)
MPV: 10 fL (ref 7.5–12.5)
Monocytes Relative: 13.4 %
Neutro Abs: 2581 cells/uL (ref 1500–7800)
Neutrophils Relative %: 48.7 %
Platelets: 266 10*3/uL (ref 140–400)
RBC: 4.67 10*6/uL (ref 4.20–5.80)
RDW: 13.3 % (ref 11.0–15.0)
Total Lymphocyte: 34.8 %
WBC: 5.3 10*3/uL (ref 3.8–10.8)

## 2019-10-31 LAB — URINALYSIS, COMPLETE
Bacteria, UA: NONE SEEN /HPF
Bilirubin Urine: NEGATIVE
Glucose, UA: NEGATIVE
Hgb urine dipstick: NEGATIVE
Hyaline Cast: NONE SEEN /LPF
Ketones, ur: NEGATIVE
Leukocytes,Ua: NEGATIVE
Nitrite: NEGATIVE
Protein, ur: NEGATIVE
RBC / HPF: NONE SEEN /HPF (ref 0–2)
Specific Gravity, Urine: 1.018 (ref 1.001–1.03)
Squamous Epithelial / HPF: NONE SEEN /HPF (ref <=5)
WBC, UA: NONE SEEN /HPF (ref 0–5)
pH: 7.5 (ref 5.0–8.0)

## 2019-10-31 LAB — COMPLETE METABOLIC PANEL WITH GFR
AG Ratio: 1.4 (calc) (ref 1.0–2.5)
ALT: 40 U/L (ref 9–46)
AST: 46 U/L — ABNORMAL HIGH (ref 10–35)
Albumin: 4.2 g/dL (ref 3.6–5.1)
Alkaline phosphatase (APISO): 60 U/L (ref 35–144)
BUN: 10 mg/dL (ref 7–25)
CO2: 28 mmol/L (ref 20–32)
Calcium: 9.3 mg/dL (ref 8.6–10.3)
Chloride: 105 mmol/L (ref 98–110)
Creat: 0.88 mg/dL (ref 0.70–1.25)
GFR, Est African American: 104 mL/min/{1.73_m2} (ref >=60)
GFR, Est Non African American: 90 mL/min/{1.73_m2} (ref >=60)
Globulin: 3 g/dL (calc) (ref 1.9–3.7)
Glucose, Bld: 115 mg/dL — ABNORMAL HIGH (ref 65–99)
Potassium: 3.9 mmol/L (ref 3.5–5.3)
Sodium: 139 mmol/L (ref 135–146)
Total Bilirubin: 0.6 mg/dL (ref 0.2–1.2)
Total Protein: 7.2 g/dL (ref 6.1–8.1)

## 2019-10-31 LAB — CK: Total CK: 366 U/L — ABNORMAL HIGH (ref 44–196)

## 2019-10-31 LAB — TSH: TSH: 0.68 mIU/L (ref 0.40–4.50)

## 2019-10-31 NOTE — Telephone Encounter (Signed)
I spoke with the patient and relayed his lab results. Patient says he took the day off work and will be going back on Monday.

## 2019-10-31 NOTE — Telephone Encounter (Signed)
Unable to write patient out for 2 weeks of work. There is no medical diagnosis for the patient to have a medical note keeping him out of work.
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
## 2019-12-02 ENCOUNTER — Other Ambulatory Visit: Payer: Self-pay

## 2019-12-02 ENCOUNTER — Ambulatory Visit: Payer: BC Managed Care – PPO | Admitting: Internal Medicine

## 2019-12-02 ENCOUNTER — Encounter: Payer: Self-pay | Admitting: Internal Medicine

## 2019-12-02 VITALS — BP 126/84 | HR 100 | Temp 98.3°F | Resp 16 | Ht 69.0 in | Wt 218.4 lb

## 2019-12-02 DIAGNOSIS — I10 Essential (primary) hypertension: Secondary | ICD-10-CM

## 2019-12-02 DIAGNOSIS — F321 Major depressive disorder, single episode, moderate: Secondary | ICD-10-CM

## 2019-12-02 DIAGNOSIS — G4700 Insomnia, unspecified: Secondary | ICD-10-CM

## 2019-12-02 DIAGNOSIS — E6609 Other obesity due to excess calories: Secondary | ICD-10-CM

## 2019-12-02 DIAGNOSIS — R748 Abnormal levels of other serum enzymes: Secondary | ICD-10-CM | POA: Diagnosis not present

## 2019-12-02 DIAGNOSIS — R251 Tremor, unspecified: Secondary | ICD-10-CM

## 2019-12-02 DIAGNOSIS — N529 Male erectile dysfunction, unspecified: Secondary | ICD-10-CM

## 2019-12-02 DIAGNOSIS — G4733 Obstructive sleep apnea (adult) (pediatric): Secondary | ICD-10-CM

## 2019-12-02 DIAGNOSIS — E782 Mixed hyperlipidemia: Secondary | ICD-10-CM

## 2019-12-02 DIAGNOSIS — Z6830 Body mass index (BMI) 30.0-30.9, adult: Secondary | ICD-10-CM

## 2019-12-02 DIAGNOSIS — R5383 Other fatigue: Secondary | ICD-10-CM

## 2019-12-31 DIAGNOSIS — Z03818 Encounter for observation for suspected exposure to other biological agents ruled out: Secondary | ICD-10-CM | POA: Diagnosis not present

## 2019-12-31 DIAGNOSIS — J019 Acute sinusitis, unspecified: Secondary | ICD-10-CM | POA: Diagnosis not present

## 2019-12-31 DIAGNOSIS — J40 Bronchitis, not specified as acute or chronic: Secondary | ICD-10-CM | POA: Diagnosis not present

## 2020-01-05 DIAGNOSIS — R058 Other specified cough: Secondary | ICD-10-CM | POA: Diagnosis not present

## 2020-01-05 DIAGNOSIS — R062 Wheezing: Secondary | ICD-10-CM | POA: Diagnosis not present
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
## 2020-03-12 ENCOUNTER — Ambulatory Visit: Payer: BC Managed Care – PPO | Admitting: Internal Medicine

## 2020-03-12 ENCOUNTER — Other Ambulatory Visit: Payer: Self-pay

## 2020-03-12 ENCOUNTER — Encounter: Payer: Self-pay | Admitting: Internal Medicine

## 2020-03-12 VITALS — BP 140/90 | HR 86 | Temp 98.7°F | Resp 16 | Ht 69.0 in | Wt 228.0 lb

## 2020-03-12 DIAGNOSIS — E782 Mixed hyperlipidemia: Secondary | ICD-10-CM

## 2020-03-12 DIAGNOSIS — I10 Essential (primary) hypertension: Secondary | ICD-10-CM

## 2020-03-12 DIAGNOSIS — F321 Major depressive disorder, single episode, moderate: Secondary | ICD-10-CM

## 2020-03-12 DIAGNOSIS — E6609 Other obesity due to excess calories: Secondary | ICD-10-CM

## 2020-03-12 DIAGNOSIS — R748 Abnormal levels of other serum enzymes: Secondary | ICD-10-CM | POA: Diagnosis not present

## 2020-03-12 DIAGNOSIS — Z23 Encounter for immunization: Secondary | ICD-10-CM

## 2020-03-12 DIAGNOSIS — N529 Male erectile dysfunction, unspecified: Secondary | ICD-10-CM

## 2020-03-12 DIAGNOSIS — G4700 Insomnia, unspecified: Secondary | ICD-10-CM

## 2020-03-12 DIAGNOSIS — G4733 Obstructive sleep apnea (adult) (pediatric): Secondary | ICD-10-CM | POA: Diagnosis not present

## 2020-03-12 DIAGNOSIS — Z6830 Body mass index (BMI) 30.0-30.9, adult: Secondary | ICD-10-CM

## 2020-03-12 MED ORDER — AMLODIPINE BESYLATE 10 MG PO TABS: 10.0000 mg | ORAL_TABLET | Freq: Every day | ORAL | 1 refills | Status: DC

## 2020-03-12 MED ORDER — SILDENAFIL CITRATE 50 MG PO TABS: 50.0000 mg | ORAL_TABLET | Freq: Every day | ORAL | 2 refills | Status: DC | PRN

## 2020-03-12 NOTE — Patient Instructions (Signed)
ake two of the 5 mg amlodopine tablets daily until run out, then take one of the 10 mg amlodopine daily after.

## 2020-03-13 LAB — LIPID PANEL
Cholesterol: 232 mg/dL — ABNORMAL HIGH (ref <200)
HDL: 74 mg/dL (ref >=40)
LDL Cholesterol (Calc): 133 mg/dL (calc) — ABNORMAL HIGH
Non-HDL Cholesterol (Calc): 158 mg/dL (calc) — ABNORMAL HIGH (ref <130)
Total CHOL/HDL Ratio: 3.1 (calc) (ref <5.0)
Triglycerides: 139 mg/dL (ref <150)

## 2020-03-13 LAB — BASIC METABOLIC PANEL WITH GFR
BUN: 9 mg/dL (ref 7–25)
CO2: 28 mmol/L (ref 20–32)
Calcium: 9.7 mg/dL (ref 8.6–10.3)
Chloride: 102 mmol/L (ref 98–110)
Creat: 0.97 mg/dL (ref 0.70–1.25)
GFR, Est African American: 94 mL/min/{1.73_m2} (ref >=60)
GFR, Est Non African American: 81 mL/min/{1.73_m2} (ref >=60)
Glucose, Bld: 126 mg/dL — ABNORMAL HIGH (ref 65–99)
Potassium: 4 mmol/L (ref 3.5–5.3)
Sodium: 139 mmol/L (ref 135–146)

## 2020-03-13 LAB — CK: Total CK: 433 U/L — ABNORMAL HIGH (ref 44–196)

## 2020-03-14 NOTE — Progress Notes (Signed)
Please let the patient know that his labs were reviewed. The basic metabolic panel showed a slightly higher glucose at 126, with the remainder of the profile all normal. Will check an A1C with next lab tests in the future. The lipid panel was elevated again now that he is off the statin medicine with the total cholesterol 232 and the LDL cholesterol 133. The HDL cholesterol remains very good at 74, so a lot of the elevated cholesterol is the good type. Continuing with diet modifications important. The CK remains elevated at 433. This elevation did not improve with stopping the statin medicine. The cause of this is still not entirely clear.  Normal CK levels vary by race and gender and age, with the real question is it high enough to be of clinical significance. For black men, this can equate to Cl level > 1200 to be abnormal. A common cause of elevated CK levels is muscle disease, including muscle weakness or pain. The CK level is often > 1000 in most of these conditions (like inflammatory myopathies or myositis syndromes). There are many other causes as well. Often it is best to repeat the lab again after no intense exertion for at least 3 days, and get other lab tests in combination to help further assess the significance, and sometimes ask rheumatology to help pending results. Sometimes other tests are needed as well. If he is having increased muscle pains or weakness, have him call and make a follow up appointment prior to his planned 3 month follow up. And if he is develops chest pains, that needs to be assessed emergently in an ER setting. Also remain off of the statin presently. Thanks
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
## 2020-04-07 ENCOUNTER — Ambulatory Visit: Payer: BC Managed Care – PPO | Admitting: Internal Medicine

## 2020-04-07 ENCOUNTER — Encounter: Payer: Self-pay | Admitting: Internal Medicine

## 2020-04-07 ENCOUNTER — Other Ambulatory Visit: Payer: Self-pay

## 2020-04-07 VITALS — BP 138/86 | HR 87 | Temp 98.3°F | Resp 16 | Ht 69.0 in | Wt 234.0 lb

## 2020-04-07 DIAGNOSIS — E782 Mixed hyperlipidemia: Secondary | ICD-10-CM | POA: Diagnosis not present

## 2020-04-07 DIAGNOSIS — I10 Essential (primary) hypertension: Secondary | ICD-10-CM | POA: Diagnosis not present

## 2020-04-07 DIAGNOSIS — R748 Abnormal levels of other serum enzymes: Secondary | ICD-10-CM

## 2020-04-07 DIAGNOSIS — E6609 Other obesity due to excess calories: Secondary | ICD-10-CM

## 2020-04-07 DIAGNOSIS — G4733 Obstructive sleep apnea (adult) (pediatric): Secondary | ICD-10-CM | POA: Diagnosis not present

## 2020-04-07 DIAGNOSIS — Z6834 Body mass index (BMI) 34.0-34.9, adult: Secondary | ICD-10-CM

## 2020-04-26 ENCOUNTER — Emergency Department: Payer: BC Managed Care – PPO

## 2020-04-26 ENCOUNTER — Observation Stay: Payer: BC Managed Care – PPO

## 2020-04-26 ENCOUNTER — Observation Stay
Admission: EM | Admit: 2020-04-26 | Discharge: 2020-04-27 | Disposition: A | Discharge status: Home / Self Care | Payer: BC Managed Care – PPO | Attending: Internal Medicine | Admitting: Internal Medicine

## 2020-04-26 ENCOUNTER — Other Ambulatory Visit: Payer: Self-pay

## 2020-04-26 DIAGNOSIS — Z720 Tobacco use: Secondary | ICD-10-CM | POA: Diagnosis present

## 2020-04-26 DIAGNOSIS — I10 Essential (primary) hypertension: Secondary | ICD-10-CM | POA: Diagnosis not present

## 2020-04-26 DIAGNOSIS — R200 Anesthesia of skin: Secondary | ICD-10-CM | POA: Diagnosis not present

## 2020-04-26 DIAGNOSIS — F17210 Nicotine dependence, cigarettes, uncomplicated: Secondary | ICD-10-CM | POA: Insufficient documentation

## 2020-04-26 DIAGNOSIS — Q283 Other malformations of cerebral vessels: Secondary | ICD-10-CM | POA: Diagnosis not present

## 2020-04-26 DIAGNOSIS — G9389 Other specified disorders of brain: Secondary | ICD-10-CM | POA: Diagnosis not present

## 2020-04-26 DIAGNOSIS — I639 Cerebral infarction, unspecified: Principal | ICD-10-CM | POA: Insufficient documentation

## 2020-04-26 DIAGNOSIS — Z79899 Other long term (current) drug therapy: Secondary | ICD-10-CM | POA: Insufficient documentation

## 2020-04-26 DIAGNOSIS — R29818 Other symptoms and signs involving the nervous system: Secondary | ICD-10-CM | POA: Diagnosis not present

## 2020-04-26 DIAGNOSIS — E782 Mixed hyperlipidemia: Secondary | ICD-10-CM | POA: Diagnosis not present

## 2020-04-26 DIAGNOSIS — M25562 Pain in left knee: Secondary | ICD-10-CM | POA: Diagnosis not present

## 2020-04-26 DIAGNOSIS — R202 Paresthesia of skin: Secondary | ICD-10-CM | POA: Insufficient documentation

## 2020-04-26 DIAGNOSIS — Z20822 Contact with and (suspected) exposure to covid-19: Secondary | ICD-10-CM | POA: Insufficient documentation

## 2020-04-26 DIAGNOSIS — I6601 Occlusion and stenosis of right middle cerebral artery: Secondary | ICD-10-CM | POA: Diagnosis not present

## 2020-04-26 DIAGNOSIS — Z8673 Personal history of transient ischemic attack (TIA), and cerebral infarction without residual deficits: Secondary | ICD-10-CM | POA: Diagnosis present

## 2020-04-26 DIAGNOSIS — I6611 Occlusion and stenosis of right anterior cerebral artery: Secondary | ICD-10-CM | POA: Diagnosis not present

## 2020-04-26 HISTORY — DX: Cerebral infarction, unspecified: I63.9

## 2020-04-26 LAB — DIFFERENTIAL
Abs Immature Granulocytes: 0.03 10*3/uL (ref 0.00–0.07)
Basophils Absolute: 0 10*3/uL (ref 0.0–0.1)
Basophils Relative: 1 %
Eosinophils Absolute: 0.2 10*3/uL (ref 0.0–0.5)
Eosinophils Relative: 2 %
Immature Granulocytes: 0 %
Lymphocytes Relative: 25 %
Lymphs Abs: 2.1 10*3/uL (ref 0.7–4.0)
Monocytes Absolute: 0.9 10*3/uL (ref 0.1–1.0)
Monocytes Relative: 10 %
Neutro Abs: 5.3 10*3/uL (ref 1.7–7.7)
Neutrophils Relative %: 62 %

## 2020-04-26 LAB — COMPREHENSIVE METABOLIC PANEL
ALT: 45 U/L — ABNORMAL HIGH (ref 0–44)
AST: 51 U/L — ABNORMAL HIGH (ref 15–41)
Albumin: 3.9 g/dL (ref 3.5–5.0)
Alkaline Phosphatase: 53 U/L (ref 38–126)
Anion gap: 9 (ref 5–15)
BUN: 10 mg/dL (ref 8–23)
CO2: 26 mmol/L (ref 22–32)
Calcium: 9.2 mg/dL (ref 8.9–10.3)
Chloride: 102 mmol/L (ref 98–111)
Creatinine, Ser: 0.75 mg/dL (ref 0.61–1.24)
GFR, Estimated: >60 mL/min (ref >60)
Glucose, Bld: 135 mg/dL — ABNORMAL HIGH (ref 70–99)
Potassium: 4 mmol/L (ref 3.5–5.1)
Sodium: 137 mmol/L (ref 135–145)
Total Bilirubin: 0.9 mg/dL (ref 0.3–1.2)
Total Protein: 7.5 g/dL (ref 6.5–8.1)

## 2020-04-26 LAB — PROTIME-INR
INR: 1.1 (ref 0.8–1.2)
Prothrombin Time: 13.8 seconds (ref 11.4–15.2)

## 2020-04-26 LAB — CBC
HCT: 43.9 % (ref 39.0–52.0)
Hemoglobin: 15.2 g/dL (ref 13.0–17.0)
MCH: 31.8 pg (ref 26.0–34.0)
MCHC: 34.6 g/dL (ref 30.0–36.0)
MCV: 91.8 fL (ref 80.0–100.0)
Platelets: 230 10*3/uL (ref 150–400)
RBC: 4.78 MIL/uL (ref 4.22–5.81)
RDW: 12.6 % (ref 11.5–15.5)
WBC: 8.5 10*3/uL (ref 4.0–10.5)
nRBC: 0 % (ref 0.0–0.2)

## 2020-04-26 LAB — APTT: aPTT: 30 seconds (ref 24–36)

## 2020-04-26 LAB — HIV ANTIBODY (ROUTINE TESTING W REFLEX): HIV Screen 4th Generation wRfx: NONREACTIVE

## 2020-04-26 MED ORDER — ACETAMINOPHEN 160 MG/5ML PO SOLN
650.0000 mg | ORAL | Status: DC | PRN
  Filled 2020-04-26: qty 20.3

## 2020-04-26 MED ORDER — IOHEXOL 350 MG/ML SOLN
75.0000 mL | Freq: Once | INTRAVENOUS | Status: AC | PRN
  Administered 2020-04-26: 75 mL via INTRAVENOUS

## 2020-04-26 MED ORDER — ASPIRIN 325 MG PO TABS
325.0000 mg | ORAL_TABLET | Freq: Every day | ORAL | Status: DC
  Administered 2020-04-26: 325 mg via ORAL
  Filled 2020-04-26 (×2): qty 1

## 2020-04-26 MED ORDER — ACETAMINOPHEN 325 MG PO TABS: 650.0000 mg | ORAL_TABLET | ORAL | Status: DC | PRN

## 2020-04-26 MED ORDER — SENNOSIDES-DOCUSATE SODIUM 8.6-50 MG PO TABS: 1.0000 | ORAL_TABLET | Freq: Every evening | ORAL | Status: DC | PRN

## 2020-04-26 MED ORDER — ONDANSETRON HCL 4 MG/2ML IJ SOLN: 4.0000 mg | Freq: Three times a day (TID) | INTRAMUSCULAR | Status: DC | PRN

## 2020-04-26 MED ORDER — ASPIRIN 300 MG RE SUPP
300.0000 mg | Freq: Every day | RECTAL | Status: DC
  Filled 2020-04-26: qty 1

## 2020-04-26 MED ORDER — ACETAMINOPHEN 650 MG RE SUPP: 650.0000 mg | RECTAL | Status: DC | PRN

## 2020-04-26 MED ORDER — ATORVASTATIN CALCIUM 20 MG PO TABS
40.0000 mg | ORAL_TABLET | Freq: Every day | ORAL | Status: DC
  Administered 2020-04-26 – 2020-04-27 (×2): 40 mg via ORAL
  Filled 2020-04-26 (×2): qty 2

## 2020-04-26 MED ORDER — PANTOPRAZOLE SODIUM 40 MG PO TBEC
40.0000 mg | DELAYED_RELEASE_TABLET | Freq: Every day | ORAL | Status: DC
  Administered 2020-04-27: 40 mg via ORAL
  Filled 2020-04-26: qty 1

## 2020-04-26 MED ORDER — ENOXAPARIN SODIUM 40 MG/0.4ML ~~LOC~~ SOLN
40.0000 mg | SUBCUTANEOUS | Status: DC
  Administered 2020-04-26: 40 mg via SUBCUTANEOUS
  Filled 2020-04-26: qty 0.4

## 2020-04-26 MED ORDER — ACETAMINOPHEN 325 MG PO TABS: 650.0000 mg | ORAL_TABLET | Freq: Four times a day (QID) | ORAL | Status: DC | PRN

## 2020-04-26 MED ORDER — ASCRIPTIN 325 MG PO TABS: 1.0000 | ORAL_TABLET | Freq: Every day | ORAL | 2 refills | Status: DC

## 2020-04-26 MED ORDER — STROKE: EARLY STAGES OF RECOVERY BOOK: Freq: Once | Status: AC

## 2020-04-26 MED ORDER — NICOTINE 21 MG/24HR TD PT24
21.0000 mg | MEDICATED_PATCH | Freq: Every day | TRANSDERMAL | Status: DC
  Filled 2020-04-26: qty 1
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
## 2020-04-26 NOTE — ED Triage Notes (Signed)
Pt c/o left sided numbness in face, arm and leg since waking yesterday  Morning, no facial droop noted, pt is ambulatory to triage with a steady gait

## 2020-04-26 NOTE — ED Notes (Signed)
Patient transported to X-ray 

## 2020-04-26 NOTE — ED Notes (Signed)
Pt stated left arm numbness has has almost gone away , feels some numbness on the tip of his fingers. Pt ambulates without assistance to the bathroom

## 2020-04-26 NOTE — Consult Note (Signed)
Neurology Consultation Reason for Consult: Left-sided numbness Referring Physician: Satira Anis  CC: Left-sided numbness  History is obtained from: Patient, wife  HPI: Paul Ingram is a 66 y.o. male with a history of hypertension, hyperlipidemia who presents with left-sided weakness that started on awakening Sunday morning.  He was last known well Saturday night.   LKW: Saturday night tpa given?: no, outside of window    ROS: A 14 point ROS was performed and is negative except as noted in the HPI.   Past Medical History:  Diagnosis Date  . Allergy   . Hyperlipidemia   . Hypertension   . Obesity 09/19/2015     Family History  Problem Relation Age of Onset  . Cancer Mother   . Cancer Father   . Prostate cancer Father   . Kidney disease Brother   . Kidney failure Brother      Social History:  reports that he has been smoking cigarettes. He has a 12.60 pack-year smoking history. He has never used smokeless tobacco. He reports current alcohol use. He reports that he does not use drugs.   Exam: Current vital signs: BP (!) 151/93 (BP Location: Left Arm)   Pulse 82   Temp 98.3 F (36.8 C) (Oral)   Resp 18   Ht 5\' 8"  (1.727 m)   Wt 106.6 kg   SpO2 100%   BMI 35.73 kg/m  Vital signs in last 24 hours: Temp:  [98 F (36.7 C)-98.3 F (36.8 C)] 98.3 F (36.8 C) (01/31 1149) Pulse Rate:  [82-91] 82 (01/31 1600) Resp:  [17-18] 18 (01/31 1600) BP: (133-151)/(93-114) 151/93 (01/31 1600) SpO2:  [98 %-100 %] 100 % (01/31 1600) Weight:  [106.6 kg] 106.6 kg (01/31 1008)   Physical Exam  Constitutional: Appears well-developed and well-nourished.  Psych: Affect appropriate to situation Eyes: No scleral injection HENT: No OP obstruction MSK: no joint deformities.  Cardiovascular: Normal rate and regular rhythm.  Respiratory: Effort normal, non-labored breathing GI: Soft.  No distension. There is no tenderness.  Skin: WDI  Neuro: Mental Status: Awake, alert,  oriented and appropriate Cranial Nerves: II: Visual Fields are full. Pupils are equal, round, and reactive to light.   III,IV, VI: EOMI without ptosis or diploplia.  V: Facial sensation is diminished on the left VII: Facial movement is symmetric.  VIII: hearing is intact to voice X: Uvula elevates symmetrically XI: Shoulder shrug is symmetric. XII: tongue is midline without atrophy or fasciculations.  Motor: Tone is normal. Bulk is normal. 5/5 strength was present in bilateral upper extremities, he has 4/5 weakness of the left lower extremity Sensory: Sensation is diminished throughout the left side Cerebellar: Does not perform   I have reviewed labs in epic and the results pertinent to this consultation are: Creatinine 0.75  I have reviewed the images obtained: CT head-negative  Impression: 65 year old male with left-sided numbness present on awakening.  I suspect that he has had a small ischemic infarct, and he will need further work-up for such.  Recommendations: - HgbA1c, fasting lipid panel - MRI of the brain without contrast - Frequent neuro checks - Echocardiogram -CT angio head and neck - Prophylactic therapy-Antiplatelet med: Aspirin - dose 325mg  PO or 300mg  PR - Risk factor modification - Telemetry monitoring - PT consult, OT consult, Speech consult - Stroke team to follow    Roland Rack, MD [facility] [phone]  If 7pm- 7am, please page neurology on call as listed in [place].

## 2020-04-26 NOTE — ED Notes (Signed)
Meal Tray orderd for pt
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
## 2020-04-27 ENCOUNTER — Observation Stay (HOSPITAL_BASED_OUTPATIENT_CLINIC_OR_DEPARTMENT_OTHER)
Admit: 2020-04-27 | Discharge: 2020-04-27 | Disposition: A | Discharge status: Home / Self Care | Payer: BC Managed Care – PPO | Attending: Internal Medicine | Admitting: Internal Medicine

## 2020-04-27 DIAGNOSIS — Z720 Tobacco use: Secondary | ICD-10-CM | POA: Diagnosis not present

## 2020-04-27 DIAGNOSIS — I6389 Other cerebral infarction: Secondary | ICD-10-CM | POA: Diagnosis not present

## 2020-04-27 DIAGNOSIS — E782 Mixed hyperlipidemia: Secondary | ICD-10-CM | POA: Diagnosis not present

## 2020-04-27 DIAGNOSIS — I639 Cerebral infarction, unspecified: Secondary | ICD-10-CM | POA: Diagnosis not present

## 2020-04-27 DIAGNOSIS — I10 Essential (primary) hypertension: Secondary | ICD-10-CM | POA: Diagnosis not present

## 2020-04-27 LAB — HEMOGLOBIN A1C
Hgb A1c MFr Bld: 7 % — ABNORMAL HIGH (ref 4.8–5.6)
Mean Plasma Glucose: 154.2 mg/dL

## 2020-04-27 LAB — ECHOCARDIOGRAM COMPLETE
AR max vel: 2.07 cm2
AV Area VTI: 2.3 cm2
AV Area mean vel: 1.95 cm2
AV Mean grad: 5 mmHg
AV Peak grad: 9.2 mmHg
Ao pk vel: 1.52 m/s
Area-P 1/2: 3.06 cm2
Height: 68 in
S' Lateral: 2.6 cm
Weight: 3650.82 oz

## 2020-04-27 LAB — URINE DRUG SCREEN, QUALITATIVE (ARMC ONLY)
Amphetamines, Ur Screen: NOT DETECTED
Barbiturates, Ur Screen: NOT DETECTED
Benzodiazepine, Ur Scrn: NOT DETECTED
Cannabinoid 50 Ng, Ur ~~LOC~~: NOT DETECTED
Cocaine Metabolite,Ur ~~LOC~~: NOT DETECTED
MDMA (Ecstasy)Ur Screen: NOT DETECTED
Methadone Scn, Ur: NOT DETECTED
Opiate, Ur Screen: NOT DETECTED
Phencyclidine (PCP) Ur S: NOT DETECTED
Tricyclic, Ur Screen: NOT DETECTED

## 2020-04-27 LAB — SARS CORONAVIRUS 2 (TAT 6-24 HRS): SARS Coronavirus 2: NEGATIVE

## 2020-04-27 LAB — LIPID PANEL
Cholesterol: 228 mg/dL — ABNORMAL HIGH (ref 0–200)
HDL: 63 mg/dL (ref >40)
LDL Cholesterol: 135 mg/dL — ABNORMAL HIGH (ref 0–99)
Total CHOL/HDL Ratio: 3.6 RATIO
Triglycerides: 151 mg/dL — ABNORMAL HIGH (ref <150)
VLDL: 30 mg/dL (ref 0–40)

## 2020-04-27 MED ORDER — ENOXAPARIN SODIUM 60 MG/0.6ML ~~LOC~~ SOLN: 0.5000 mg/kg | SUBCUTANEOUS | Status: DC

## 2020-04-27 MED ORDER — CLOPIDOGREL BISULFATE 75 MG PO TABS: 75.0000 mg | ORAL_TABLET | Freq: Every day | ORAL | Status: DC

## 2020-04-27 MED ORDER — NICOTINE 21 MG/24HR TD PT24: 21.0000 mg | MEDICATED_PATCH | Freq: Every day | TRANSDERMAL | 0 refills | Status: DC

## 2020-04-27 MED ORDER — PANTOPRAZOLE SODIUM 40 MG PO TBEC: 40.0000 mg | DELAYED_RELEASE_TABLET | Freq: Every day | ORAL | 1 refills | Status: DC

## 2020-04-27 MED ORDER — CLOPIDOGREL BISULFATE 75 MG PO TABS: 300.0000 mg | ORAL_TABLET | Freq: Once | ORAL | Status: DC

## 2020-04-27 MED ORDER — CLOPIDOGREL BISULFATE 75 MG PO TABS: 75.0000 mg | ORAL_TABLET | Freq: Every day | ORAL | 1 refills | Status: DC

## 2020-04-27 MED ORDER — ATORVASTATIN CALCIUM 40 MG PO TABS: 40.0000 mg | ORAL_TABLET | Freq: Every day | ORAL | 1 refills | Status: DC

## 2020-04-27 NOTE — Progress Notes (Signed)
Subjective: Continues to improve.   Exam: Vitals:   04/27/20 0949 04/27/20 1129  BP:  (!) 143/97  Pulse:  72  Resp:  18  Temp:  98.1 F (36.7 C)  SpO2: 98% 100%   Gen: In bed, NAD Resp: non-labored breathing, no acute distress Abd: soft, nt  Neuro: MS: awake, alert, oriented EG:BTDV symmetric Motor: still with mild left leg weakness.  Sensory:decreased on the left.   Pertinent Labs: LDL  135 A1C 7.0  Impression: 65 yo M With likely small vessel ischemic stroke. He does have some large vessel athero as well, but unlikley related to current stroke. I have encouraged smoking cessation. He has newly diagnosed DM, and I think DM control, high dose statin(started on lipitor 40mg  daily), and htn will be the mainstays of stroke prevention for him.   Though I would typically do dual antiplatelet therapy, with his history of GI bleeds with NSAIDs, I think plavix monotherapy would be reasonable.   Recommendations: 1) Plavix 75mg  daily after 300mg  load. 2) lipitor 3) DM treatment 4) htn control 5) smoking cessation. 6) If echo shows no embolic source, then no further recommendations at this time.    Roland Rack, MD [facility] [phone]  If 7pm- 7am, please page neurology on call as listed in [place].

## 2020-04-27 NOTE — Discharge Summary (Signed)
Physician Discharge Summary  Paul Ingram ASN:[id number] DOB: [birth year] DOA: 04/26/2020  PCP: [place] date: 04/26/2020 Discharge date: 04/27/2020  Admitted From: Home Disposition: Home  Recommendations for Outpatient Follow-up:  1. Follow up with PCP in 1-2 weeks 2. Follow-up with neurology in 4 to 6 weeks 3. Please obtain BMP/CBC in one week 4. Please follow up on the following pending results: None  Home Health: No Equipment/Devices: None Discharge Condition: Stable CODE STATUS: Full Diet recommendation: Heart Healthy/carb modified  Brief/Interim Summary: Paul Ingram is a 65 y.o. male with medical history significant of hypertension, hyperlipidemia, GI bleeding, OSA not using CPAP, tobacco abuse, who presents with left-sided numbness.  Patient states that she started having left-sided numbness since yesterday morning. He feels numb and tingling in left face, left arm and left leg. He had mild weakness in left arm at the beginning, which has resolved quickly. No difficulty speaking.   Initial CT head was negative for any acute intracranial abnormalities.  MRI with a 7 mm acute infarct in right thalamus.  CTA without any significant stenosis in large vessels but did show severe stenosis in A2/A3 area. Patient was also found to have elevated A1c at 7, no prior diagnosis of diabetes and hyperlipidemia.  Goal LDL should be less than 70. He was started on Plavix and Lipitor by neurology, they avoided DAPT for 3 weeks due to his prior history of GI bleed with NSAID. Patient need a close follow-up with primary care provider for better control of his diabetes which is a new diagnosis, we did not start him on on any new medicine and hypertension. He was counseled extensively against smoking and will need continuation of counseling.  He was also given nicotine patch on discharge.  PT evaluated him and there was no specific recommendations.  He was only having  mild tingling involving the left hand fingers on the day of discharge.  Echocardiogram with normal EF, grade 1 diastolic dysfunction and borderline dilation of aortic root.  There was an incidental finding of AAA Paul Ingram on brain MRI which can be a slowly growing neoplasm and will need a repeat MRI with contrast in 4 to 46-month.  Can be ordered by his PCP or neurologist.  Discharge Diagnoses:  Principal Problem:   Stroke [facility]) Active Problems:   Mixed hyperlipidemia   HTN (hypertension)   Tobacco abuse  Discharge Instructions  Discharge Instructions    Diet - low sodium heart healthy   Complete by: As directed    Discharge instructions   Complete by: As directed    It was pleasure taking care of you. You are being started on Plavix and Lipitor by your neurologist to prevent further stroke. Follow-up with neurology in 4 to 6 weeks. Continue taking your blood pressure medications and work with your primary care provider for a better control to avoid future complications like stroke. You are also being given prescription for nicotine patch which can help you stop smoking to decrease your risk of stroke.   Increase activity slowly   Complete by: As directed      Allergies as of 04/27/2020      Reactions   Shellfish Allergy Anaphylaxis      Medication List    TAKE these medications   amLODipine 10 MG tablet Commonly known as: NORVASC Take 1 tablet (10 mg total) by mouth daily.   atorvastatin 40 MG tablet Commonly known as: LIPITOR Take 1 tablet (40 mg total) by  mouth daily. Start taking on: April 28, 2020   clopidogrel 75 MG tablet Commonly known as: PLAVIX Take 1 tablet (75 mg total) by mouth daily. Start taking on: April 28, 2020   nicotine 21 mg/24hr patch Commonly known as: NICODERM CQ - dosed in mg/24 hours Place 1 patch (21 mg total) onto the skin daily. Start taking on: April 28, 2020   pantoprazole 40 MG tablet Commonly known as: PROTONIX Take 1  tablet (40 mg total) by mouth daily at 12 noon. Start taking on: April 28, 2020   sildenafil 50 MG tablet Commonly known as: Viagra Take 1 tablet (50 mg total) by mouth daily as needed for erectile dysfunction. Take 30 to 60 minutes prior to planned activities Notes to patient: Not given in hospital       Paul Ingram, [place]. Schedule an appointment as soon as possible for a visit.   Specialty: Family Medicine Contact information: [place] [id number] [phone]              Allergies  Allergen Reactions  . Shellfish Allergy Anaphylaxis    Consultations:  Neurology  Procedures/Studies: CT ANGIO HEAD W OR WO CONTRAST  Result Date: 04/26/2020 CLINICAL DATA:  Initial evaluation for acute stroke. EXAM: CT ANGIOGRAPHY HEAD AND NECK TECHNIQUE: Multidetector CT imaging of the head and neck was performed using the standard protocol during bolus administration of intravenous contrast. Multiplanar CT image reconstructions and MIPs were obtained to evaluate the vascular anatomy. Carotid stenosis measurements (when applicable) are obtained utilizing NASCET criteria, using the distal internal carotid diameter as the denominator. CONTRAST:  48mL OMNIPAQUE IOHEXOL 350 MG/ML SOLN COMPARISON:  Previous MRI and CT from earlier the same day. FINDINGS: CTA NECK FINDINGS Aortic arch: Visualized aortic arch of normal caliber with normal branch pattern. Mild atheromatous change about the arch itself. No hemodynamically significant stenosis about the origin of the great vessels. Right carotid system: Right CCA patent from its origin to the bifurcation without stenosis. Mixed atheromatous change about the right bifurcation/proximal right ICA without hemodynamically significant stenosis. Right carotid artery system medialized into the retropharyngeal space. Right ICA widely patent distally without stenosis or dissection. Left carotid  system: Left CCA patent from its origin to the bifurcation without stenosis. Mixed atheromatous change about the left bifurcation/proximal left ICA without significant stenosis. Left ICA patent distally without stenosis or dissection. Vertebral arteries: Both vertebral arteries arise from subclavian arteries. No proximal subclavian artery stenosis. Vertebral arteries widely patent without stenosis or dissection. Skeleton: No visible acute osseous abnormality. Moderate to advanced multilevel cervical spondylosis. No discrete or worrisome osseous lesions. Other neck: No other acute soft tissue abnormality within the neck. No mass or adenopathy. Upper chest: Few scattered subcentimeter ground-glass densities noted within the visualized left upper lobe, likely mild atelectasis. Visualized upper chest demonstrates no other acute finding. Review of the MIP images confirms the above findings CTA HEAD FINDINGS Anterior circulation: Petrous, cavernous, and supraclinoid segments widely patent without stenosis. A1 segments patent bilaterally. Right A1 hypoplastic. Normal anterior communicating artery complex. Left anterior communicating artery patent to its distal aspect without stenosis. There is an apparent long segment severe distal right A2/A3 stenosis (series 6, images 263-267). Right ACA somewhat attenuated but patent distally. No M1 stenosis or occlusion. Normal MCA bifurcations. Distal MCA branches well perfused and symmetric. Posterior circulation: Both V4 segments patent to the vertebrobasilar junction without stenosis. Both PICA origins patent and normal. Basilar widely patent  to its distal aspect. Superior cerebral arteries patent bilaterally. Both PCAs primarily supplied via the basilar well perfused to their distal aspects. Venous sinuses: Patent. Anatomic variants: Hypoplastic right A1 segment.  No aneurysm. Review of the MIP images confirms the above findings IMPRESSION: 1. Negative CTA for large vessel  occlusion. 2. Severe right A2/A3 stenosis, with attenuated but patent right ACA distally. 3. Mild atheromatous change about the carotid bifurcations/proximal ICAs without hemodynamically significant stenosis. Electronically Signed   By: Jeannine Boga M.D.   On: 04/26/2020 20:06   CT HEAD WO CONTRAST  Result Date: 04/26/2020 CLINICAL DATA:  Neuro deficit, acute stroke suspected. EXAM: CT HEAD WITHOUT CONTRAST TECHNIQUE: Contiguous axial images were obtained from the base of the skull through the vertex without intravenous contrast. COMPARISON:  None. FINDINGS: Brain: No evidence of acute large vascular territory infarction, hemorrhage, hydrocephalus, extra-axial collection or mass lesion/mass effect. Patchy white matter hypodensities, nonspecific but most likely related to chronic microvascular ischemic disease Vascular: No hyperdense vessel identified. Skull: No acute fracture. Sinuses/Orbits: Visualized sinuses are largely clear. Unremarkable orbits. Other: No mastoid effusions. IMPRESSION: No evidence of acute intracranial abnormality. Electronically Signed   By: Margaretha Sheffield MD   On: 04/26/2020 10:36   CT ANGIO NECK W OR WO CONTRAST  Result Date: 04/26/2020 CLINICAL DATA:  Initial evaluation for acute stroke. EXAM: CT ANGIOGRAPHY HEAD AND NECK TECHNIQUE: Multidetector CT imaging of the head and neck was performed using the standard protocol during bolus administration of intravenous contrast. Multiplanar CT image reconstructions and MIPs were obtained to evaluate the vascular anatomy. Carotid stenosis measurements (when applicable) are obtained utilizing NASCET criteria, using the distal internal carotid diameter as the denominator. CONTRAST:  44mL OMNIPAQUE IOHEXOL 350 MG/ML SOLN COMPARISON:  Previous MRI and CT from earlier the same day. FINDINGS: CTA NECK FINDINGS Aortic arch: Visualized aortic arch of normal caliber with normal branch pattern. Mild atheromatous change about the arch  itself. No hemodynamically significant stenosis about the origin of the great vessels. Right carotid system: Right CCA patent from its origin to the bifurcation without stenosis. Mixed atheromatous change about the right bifurcation/proximal right ICA without hemodynamically significant stenosis. Right carotid artery system medialized into the retropharyngeal space. Right ICA widely patent distally without stenosis or dissection. Left carotid system: Left CCA patent from its origin to the bifurcation without stenosis. Mixed atheromatous change about the left bifurcation/proximal left ICA without significant stenosis. Left ICA patent distally without stenosis or dissection. Vertebral arteries: Both vertebral arteries arise from subclavian arteries. No proximal subclavian artery stenosis. Vertebral arteries widely patent without stenosis or dissection. Skeleton: No visible acute osseous abnormality. Moderate to advanced multilevel cervical spondylosis. No discrete or worrisome osseous lesions. Other neck: No other acute soft tissue abnormality within the neck. No mass or adenopathy. Upper chest: Few scattered subcentimeter ground-glass densities noted within the visualized left upper lobe, likely mild atelectasis. Visualized upper chest demonstrates no other acute finding. Review of the MIP images confirms the above findings CTA HEAD FINDINGS Anterior circulation: Petrous, cavernous, and supraclinoid segments widely patent without stenosis. A1 segments patent bilaterally. Right A1 hypoplastic. Normal anterior communicating artery complex. Left anterior communicating artery patent to its distal aspect without stenosis. There is an apparent long segment severe distal right A2/A3 stenosis (series 6, images 263-267). Right ACA somewhat attenuated but patent distally. No M1 stenosis or occlusion. Normal MCA bifurcations. Distal MCA branches well perfused and symmetric. Posterior circulation: Both V4 segments patent to the  vertebrobasilar junction without stenosis. Both PICA  origins patent and normal. Basilar widely patent to its distal aspect. Superior cerebral arteries patent bilaterally. Both PCAs primarily supplied via the basilar well perfused to their distal aspects. Venous sinuses: Patent. Anatomic variants: Hypoplastic right A1 segment.  No aneurysm. Review of the MIP images confirms the above findings IMPRESSION: 1. Negative CTA for large vessel occlusion. 2. Severe right A2/A3 stenosis, with attenuated but patent right ACA distally. 3. Mild atheromatous change about the carotid bifurcations/proximal ICAs without hemodynamically significant stenosis. Electronically Signed   By: Jeannine Boga M.D.   On: 04/26/2020 20:06   MR BRAIN WO CONTRAST  Result Date: 04/26/2020 CLINICAL DATA:  Neuro deficit, acute, stroke suspected. Additional history provided: Patient with history of hypertension reporting left face, arm and leg numbness and tingling. EXAM: MRI HEAD WITHOUT CONTRAST TECHNIQUE: Multiplanar, multiecho pulse sequences of the brain and surrounding structures were obtained without intravenous contrast. COMPARISON:  Noncontrast head CT performed earlier today 04/26/2020. FINDINGS: Brain: Mild intermittent motion degradation. Mild cerebral and cerebellar atrophy. 7 mm focus of restricted diffusion within the left thalamus consistent with acute infarction (series 5, image 25) (series 7, image 20). Mild multifocal T2/FLAIR hyperintensity within the cerebral white matter is nonspecific, but compatible with chronic small vessel ischemic disease. 8 mm rounded T2 hyperintense lesion within the high right postcentral gyrus with subtle surrounding gliosis (series 15, image 46) (series 10, image 23). No chronic intracranial blood products. No extra-axial fluid collection. No midline shift. Vascular: Expected proximal arterial flow voids. Skull and upper cervical spine: No focal marrow lesion. Sinuses/Orbits: Visualized  orbits show no acute finding. Trace paranasal sinus mucosal thickening, most notably left frontal. IMPRESSION: 7 mm acute infarct within the right thalamus. 8 mm rounded T2 hyperintense lesion within the high right postcentral gyrus with subtle surrounding gliosis. This may reflect a focus of chronic encephalomalacia. However, given the atypical rounded appearance, alternative etiologies are difficult to exclude (i.e. low-grade neoplasm). A nonemergent contrast-enhanced brain MRI is recommended for further characterization. Additionally, 4-6 month MRI follow-up is recommended to ensure stability. Mild generalized atrophy of the brain and chronic small vessel ischemic disease. Mild paranasal sinus mucosal thickening. Electronically Signed   By: Kellie Simmering DO   On: 04/26/2020 18:58   DG Knee Complete 4 Views Left  Result Date: 04/26/2020 CLINICAL DATA:  Fall, lateral knee pain EXAM: LEFT KNEE - COMPLETE 4+ VIEW COMPARISON:  None. FINDINGS: Three view radiograph left knee demonstrates normal alignment. No fracture or dislocation. Mild bicompartmental degenerative arthritis with osteophyte formation involving the lateral and patellofemoral compartments. Joint spaces appear preserved. Degenerative chondrocalcinosis of the lateral meniscus noted. No effusion. Vascular calcifications seen in the posterior soft tissues. IMPRESSION: No acute fracture or dislocation.  There Electronically Signed   By: Fidela Salisbury MD   On: 04/26/2020 13:51   ECHOCARDIOGRAM COMPLETE  Result Date: 04/27/2020    ECHOCARDIOGRAM REPORT   Patient Name:   Paul Ingram Ramsay Date of Exam: 04/27/2020 Medical Rec #:  [id number]      Height:       68.0 in Accession #:    [id number]     Weight:       228.2 lb Date of Birth:  06-30-1955     BSA:          2.161 m Patient Age:    65 years       BP:           143/97 mmHg Patient Gender: M  HR:           72 bpm. Exam Location:  [facility] Procedure: 2D Echo, Cardiac Doppler and Color Doppler  Indications:     Stroke 434.91 / I163.9  History:         Patient has prior history of Echocardiogram examinations, most                  recent 02/08/2019. Risk Factors:Hypertension and Dyslipidemia.  Sonographer:     Sherrie Sport RDCS (AE) Referring Phys:  [id number] [facility] Nyana Haren Diagnosing Phys: Nelva Bush MD IMPRESSIONS  1. Left ventricular ejection fraction, by estimation, is 55 to 60%. The left ventricle has normal function. The left ventricle has no regional wall motion abnormalities. Left ventricular diastolic parameters are consistent with Grade I diastolic dysfunction (impaired relaxation).  2. Pulmonary artery pressure is at least upper normal to mildly elevated (30-35 mmHg plus central venous pressure0. Right ventricular systolic function is normal. The right ventricular size is normal.  3. The mitral valve is degenerative. Trivial mitral valve regurgitation. No evidence of mitral stenosis.  4. The aortic valve has an indeterminant number of cusps. There is mild calcification of the aortic valve. There is mild thickening of the aortic valve. Aortic valve regurgitation is not visualized. Mild to moderate aortic valve sclerosis/calcification is present, without any evidence of aortic stenosis.  5. Aortic dilatation noted. There is borderline dilatation of the aortic root, measuring 37 mm. FINDINGS  Left Ventricle: Left ventricular ejection fraction, by estimation, is 55 to 60%. The left ventricle has normal function. The left ventricle has no regional wall motion abnormalities. The left ventricular internal cavity size was normal in size. There is  borderline left ventricular hypertrophy. Left ventricular diastolic parameters are consistent with Grade I diastolic dysfunction (impaired relaxation). Right Ventricle: Pulmonary artery pressure is at least upper normal to mildly elevated (30-35 mmHg plus central venous pressure0. The right ventricular size is normal. No increase in right ventricular wall  thickness. Right ventricular systolic function is normal. Left Atrium: Left atrial size was normal in size. Right Atrium: Right atrial size was normal in size. Pericardium: The pericardium was not well visualized. Mitral Valve: The mitral valve is degenerative in appearance. There is mild calcification of the mitral valve leaflet(s). Trivial mitral valve regurgitation. No evidence of mitral valve stenosis. Tricuspid Valve: The tricuspid valve is grossly normal. Tricuspid valve regurgitation is trivial. Aortic Valve: The aortic valve has an indeterminant number of cusps. There is mild calcification of the aortic valve. There is mild thickening of the aortic valve. Aortic valve regurgitation is not visualized. Mild to moderate aortic valve sclerosis/calcification is present, without any evidence of aortic stenosis. Aortic valve mean gradient measures 5.0 mmHg. Aortic valve peak gradient measures 9.2 mmHg. Aortic valve area, by VTI measures 2.30 cm. Pulmonic Valve: The pulmonic valve was not well visualized. Pulmonic valve regurgitation is not visualized. No evidence of pulmonic stenosis. Aorta: Aortic dilatation noted. There is borderline dilatation of the aortic root, measuring 37 mm. Venous: The inferior vena cava was not well visualized. IAS/Shunts: The interatrial septum was not well visualized.  LEFT VENTRICLE PLAX 2D LVIDd:         4.75 cm  Diastology LVIDs:         2.60 cm  LV e' medial:    5.87 cm/s LV PW:         1.07 cm  LV E/e' medial:  10.7 LV IVS:        1.05 cm  LV e' lateral:   9.68 cm/s LVOT diam:     2.10 cm  LV E/e' lateral: 6.5 LV SV:         56 LV SV Index:   26 LVOT Area:     3.46 cm  RIGHT VENTRICLE RV Basal diam:  3.42 cm RV S prime:     13.50 cm/s TAPSE (M-mode): 3.9 cm LEFT ATRIUM             Index       RIGHT ATRIUM           Index LA diam:        3.20 cm 1.48 cm/m  RA Area:     14.80 cm LA Vol (A2C):   59.3 ml 27.43 ml/m RA Volume:   38.90 ml  18.00 ml/m LA Vol (A4C):   56.0 ml 25.91  ml/m LA Biplane Vol: 58.4 ml 27.02 ml/m  AORTIC VALVE                    PULMONIC VALVE AV Area (Vmax):    2.07 cm     PV Vmax:        0.95 m/s AV Area (Vmean):   1.95 cm     PV Peak grad:   3.6 mmHg AV Area (VTI):     2.30 cm     RVOT Peak grad: 3 mmHg AV Vmax:           152.00 cm/s AV Vmean:          104.000 cm/s AV VTI:            0.244 m AV Peak Grad:      9.2 mmHg AV Mean Grad:      5.0 mmHg LVOT Vmax:         90.70 cm/s LVOT Vmean:        58.500 cm/s LVOT VTI:          0.162 m LVOT/AV VTI ratio: 0.66  AORTA Ao Root diam: 3.70 cm MITRAL VALVE               TRICUSPID VALVE MV Area (PHT): 3.06 cm    TR Peak grad:   31.4 mmHg MV Decel Time: 248 msec    TR Vmax:        280.00 cm/s MV E velocity: 62.60 cm/s MV A velocity: 95.10 cm/s  SHUNTS MV E/A ratio:  0.66        Systemic VTI:  0.16 m                            Systemic Diam: 2.10 cm Nelva Bush MD Electronically signed by Nelva Bush MD Signature Date/Time: 04/27/2020/1:23:35 PM    Final      Subjective: Patient continued to have some numbness on the left hand fingers, strength seems improving.  Denies any other complaint. Asking for letter for work.  Discharge Exam: Vitals:   04/27/20 0949 04/27/20 1129  BP:  (!) 143/97  Pulse:  72  Resp:  18  Temp:  98.1 F (36.7 C)  SpO2: 98% 100%   Vitals:   04/27/20 0753 04/27/20 0830 04/27/20 0949 04/27/20 1129  BP: (!) 131/91 (!) 119/91  (!) 143/97  Pulse: 68 74  72  Resp: 18 16  18   Temp: 98.1 F (36.7 C) 98 F (36.7 C)  98.1 F (36.7 C)  TempSrc: Oral Oral  Oral  SpO2:  99% 100% 98% 100%  Weight:      Height:        General: Pt is alert, awake, not in acute distress Cardiovascular: RRR, S1/S2 +, no rubs, no gallops Respiratory: CTA bilaterally, no wheezing, no rhonchi Abdominal: Soft, NT, ND, bowel sounds + Extremities: no edema, no cyanosis   The results of significant diagnostics from this hospitalization (including imaging, microbiology, ancillary and laboratory)  are listed below for reference.    Microbiology: Recent Results (from the past 240 hour(s))  SARS CORONAVIRUS 2 (TAT 6-24 HRS) Nasopharyngeal Nasopharyngeal Swab     Status: None   Collection Time: 04/26/20  1:52 PM   Specimen: Nasopharyngeal Swab  Result Value Ref Range Status   SARS Coronavirus 2 NEGATIVE NEGATIVE Final    Comment: (NOTE) SARS-CoV-2 target nucleic acids are NOT DETECTED.  The SARS-CoV-2 RNA is generally detectable in upper and lower respiratory specimens during the acute phase of infection. Negative results do not preclude SARS-CoV-2 infection, do not rule out co-infections with other pathogens, and should not be used as the sole basis for treatment or other patient management decisions. Negative results must be combined with clinical observations, patient history, and epidemiological information. The expected result is Negative.  Fact Sheet for Patients: SugarRoll.be  Fact Sheet for Healthcare Providers: https://www.woods-mathews.com/  This test is not yet approved or cleared by the [place] FDA and  has been authorized for detection and/or diagnosis of SARS-CoV-2 by FDA under an Emergency Use Authorization (EUA). This EUA will remain  in effect (meaning this test can be used) for the duration of the COVID-19 declaration under Se ction 564(b)(1) of the Act, 21 U.S.C. section 360bbb-3(b)(1), unless the authorization is terminated or revoked sooner.  Performed at [facility], [place] [address] [place], [place] [address]      Labs: BNP (last 3 results) No results for input(s): BNP in the last 8760 hours. Basic Metabolic Panel: Recent Labs  Lab 04/26/20 1027  NA 137  K 4.0  CL 102  CO2 26  GLUCOSE 135*  BUN 10  CREATININE 0.75  CALCIUM 9.2   Liver Function Tests: Recent Labs  Lab 04/26/20 1027  AST 51*  ALT 45*  ALKPHOS 53  BILITOT 0.9  PROT 7.5  ALBUMIN 3.9   No results for input(s):  LIPASE, AMYLASE in the last 168 hours. No results for input(s): AMMONIA in the last 168 hours. CBC: Recent Labs  Lab 04/26/20 1027  WBC 8.5  NEUTROABS 5.3  HGB 15.2  HCT 43.9  MCV 91.8  PLT 230   Cardiac Enzymes: No results for input(s): CKTOTAL, CKMB, CKMBINDEX, TROPONINI in the last 168 hours. BNP: Invalid input(s): POCBNP CBG: No results for input(s): GLUCAP in the last 168 hours. D-Dimer No results for input(s): DDIMER in the last 72 hours. Hgb A1c Recent Labs    04/27/20 0458  HGBA1C 7.0*   Lipid Profile Recent Labs    04/27/20 0458  CHOL 228*  HDL 63  LDLCALC 135*  TRIG 151*  CHOLHDL 3.6   Thyroid function studies No results for input(s): TSH, T4TOTAL, T3FREE, THYROIDAB in the last 72 hours.  Invalid input(s): FREET3 Anemia work up No results for input(s): VITAMINB12, FOLATE, FERRITIN, TIBC, IRON, RETICCTPCT in the last 72 hours. Urinalysis    Component Value Date/Time   COLORURINE YELLOW 10/30/2019 [place] 10/30/2019 1205   LABSPEC 1.018 10/30/2019 1205   PHURINE 7.5 10/30/2019 1205   GLUCOSEU NEGATIVE 10/30/2019 1205  HGBUR NEGATIVE 10/30/2019 1205   [place] 10/30/2019 1205   PROTEINUR NEGATIVE 10/30/2019 1205   NITRITE NEGATIVE 10/30/2019 1205   LEUKOCYTESUR NEGATIVE 10/30/2019 1205   Sepsis Labs Invalid input(s): PROCALCITONIN,  WBC,  LACTICIDVEN Microbiology Recent Results (from the past 240 hour(s))  SARS CORONAVIRUS 2 (TAT 6-24 HRS) Nasopharyngeal Nasopharyngeal Swab     Status: None   Collection Time: 04/26/20  1:52 PM   Specimen: Nasopharyngeal Swab  Result Value Ref Range Status   SARS Coronavirus 2 NEGATIVE NEGATIVE Final    Comment: (NOTE) SARS-CoV-2 target nucleic acids are NOT DETECTED.  The SARS-CoV-2 RNA is generally detectable in upper and lower respiratory specimens during the acute phase of infection. Negative results do not preclude SARS-CoV-2 infection, do not rule out co-infections with  other pathogens, and should not be used as the sole basis for treatment or other patient management decisions. Negative results must be combined with clinical observations, patient history, and epidemiological information. The expected result is Negative.  Fact Sheet for Patients: SugarRoll.be  Fact Sheet for Healthcare Providers: https://www.woods-mathews.com/  This test is not yet approved or cleared by the [place] FDA and  has been authorized for detection and/or diagnosis of SARS-CoV-2 by FDA under an Emergency Use Authorization (EUA). This EUA will remain  in effect (meaning this test can be used) for the duration of the COVID-19 declaration under Se ction 564(b)(1) of the Act, 21 U.S.C. section 360bbb-3(b)(1), unless the authorization is terminated or revoked sooner.  Performed at [facility], [place] [address] [place], [place] [address]     Time coordinating discharge: Over 30 minutes  SIGNED:  Lorella Nimrod, MD  [facility] 04/27/2020, 3:11 PM  If 7PM-7AM, please contact night-coverage [email or web address]  This record has been created using Systems analyst. Errors have been sought and corrected,but may not always be located. Such creation errors do not reflect on the standard of care.

## 2020-04-27 NOTE — Evaluation (Signed)
Occupational Therapy Evaluation Patient Details Name: Paul Ingram MRN: [id number] DOB: [birth year] Today's Date: 04/27/2020    History of Present Illness Paul Ingram is a 65 y.o. male with medical history significant of hypertension, hyperlipidemia, GI bleeding, OSA not using CPAP, tobacco abuse, who presents with left-sided numbness   Clinical Impression   Pt currently demonstrating functional mobility and self care tasks independently and with increased time as needed. Pt did not need AD for functional ambulation/transfers. Pt able to utilize L UE in B UE coordination tasks functionally. Pt does report some tingling in L digits but pt able to utilize to complete all needed activities without assistance. Pt with no further OT needs at this time. OT to SIGN OFF. Thank you for this referral.     Follow Up Recommendations  No OT follow up    Equipment Recommendations  None recommended by OT       Precautions / Restrictions Precautions Precautions: None Restrictions Weight Bearing Restrictions: No      Mobility Bed Mobility Overal bed mobility: Independent                  Transfers Overall transfer level: Independent Equipment used: None                  Balance Overall balance assessment: Independent                                         ADL either performed or assessed with clinical judgement   ADL Overall ADL's : Modified independent                                       General ADL Comments: increased time to complete tasks     Vision Baseline Vision/History: No visual deficits Patient Visual Report: No change from baseline              Pertinent Vitals/Pain Pain Assessment: No/denies pain     Hand Dominance Left   Extremity/Trunk Assessment Upper Extremity Assessment Upper Extremity Assessment: LUE deficits/detail LUE Deficits / Details: 4/5 with functional movement but decreased dexterity and  sensation. Pt reports tingling in digits only. Temperature reported as same on B UEs LUE Sensation: decreased light touch LUE Coordination: decreased fine motor   Lower Extremity Assessment Lower Extremity Assessment: Overall WFL for tasks assessed       Communication Communication Communication: No difficulties   Cognition Arousal/Alertness: Awake/alert Behavior During Therapy: WFL for tasks assessed/performed Overall Cognitive Status: Within Functional Limits for tasks assessed                                                Home Living Family/patient expects to be discharged to:: Private residence Living Arrangements: Spouse/significant other Available Help at Discharge: Family;Available PRN/intermittently Type of Home: House Home Access: Stairs to enter [employer] of Steps: 3 Entrance Stairs-Rails: None Home Layout: One level     Bathroom Shower/Tub: Tub/shower unit         Home Equipment: None          Prior Functioning/Environment Level of Independence: Independent        Comments: works full  time, drives        OT Problem List:        OT Treatment/Interventions:      OT Goals(Current goals can be found in the care plan section) Acute Rehab OT Goals Patient Stated Goal: to return home OT Goal Formulation: With patient Time For Goal Achievement: 05/11/20 Potential to Achieve Goals: Good  OT Frequency:      AM-PAC OT "6 Clicks" Daily Activity     Outcome Measure Help from another person eating meals?: None Help from another person taking care of personal grooming?: None Help from another person toileting, which includes using toliet, bedpan, or urinal?: None Help from another person bathing (including washing, rinsing, drying)?: None Help from another person to put on and taking off regular upper body clothing?: None Help from another person to put on and taking off regular lower body clothing?: None 6 Click Score:  24   End of Session    Activity Tolerance: Patient tolerated treatment well Patient left: in bed;with bed alarm set                   Time: 6568-1275 OT Time Calculation (min): 23 min Charges:  OT General Charges $OT Visit: 1 Visit OT Evaluation $OT Eval Low Complexity: 1 Low OT Treatments $Self Care/Home Management : 8-22 mins  Darleen Crocker, MS, OTR/L , CBIS ascom [phone]  04/27/20, 12:29 PM   04/27/2020, 10:50 AM

## 2020-04-27 NOTE — Progress Notes (Signed)
SLP Cancellation Note  Patient Details Name: Paul Ingram MRN: [id number] DOB: [birth year]   Cancelled treatment:       Reason Eval/Treat Not Completed: SLP screened, no needs identified, will sign off (chart reviewed; consulted NSG then met w/ pt).  Pt denied any difficulty swallowing and is currently on a regular diet; tolerates swallowing pills w/ water per NSG. Pt conversed at conversational level w/out deficits noted; pt denied any speech-language deficits.  No further skilled ST services indicated as pt appears at his baseline. Pt agreed. NSG to reconsult if any change in status.      Orinda Kenner, MS, CCC-SLP Speech Language Pathologist Rehab Services [phone] [facility] 04/27/2020, 10:44 AM

## 2020-04-27 NOTE — Progress Notes (Signed)
Writer spoke with patient about his A1c is high which makes him diabetic and he needs to follow-up with primary care provider so they can start him on antidiabetic medications per MD recommendation. Patient stable and ready for discharge home. Patient's IV removed. Writer went over discharge paperwork and he verbalized understanding and had no questions. Patient was also sent home with stroke booklet. Patient dressed hisself. Patient was wheeled to his car by NT for him to go home.

## 2020-04-27 NOTE — Progress Notes (Signed)
*  PRELIMINARY RESULTS* Echocardiogram 2D Echocardiogram has been performed.  Paul Ingram 04/27/2020, 1:05 PM

## 2020-04-27 NOTE — Evaluation (Signed)
Physical Therapy Evaluation Patient Details Name: Paul Ingram MRN: [id number] DOB: 25-Feb-1956 Today's Date: 04/27/2020   History of Present Illness  Paul Ingram is a 65 y.o. male with medical history significant of hypertension, hyperlipidemia, GI bleeding, OSA not using CPAP, tobacco abuse, who presents with left-sided numbness  Clinical Impression  Patient is agreeable to PT assessment. Reports light tingling/numbness on left side of body. Functionally, patient is independent with mobility. Ambulated 200 feet with supervision, up/down 4 steps with B rails with supervision. Patient has no PT needs at this time. Will sign off.     Follow Up Recommendations No PT follow up    Equipment Recommendations  None recommended by PT    Recommendations for Other Services       Precautions / Restrictions Precautions Precautions: None Restrictions Weight Bearing Restrictions: No      Mobility  Bed Mobility Overal bed mobility: Independent                  Transfers Overall transfer level: Independent Equipment used: None                Ambulation/Gait Ambulation/Gait assistance: Supervision Gait Distance (Feet): 200 Feet Assistive device: None Gait Pattern/deviations: WFL(Within Functional Limits) Gait velocity: WNL   General Gait Details: no difficulties ambulating other than reported tingling/numbness on left.  Stairs Stairs: Yes Stairs assistance: Supervision Stair Management: Two rails;Alternating pattern Number of Stairs: 4    Wheelchair Mobility    Modified Rankin (Stroke Patients Only)       Balance Overall balance assessment: Independent                                           Pertinent Vitals/Pain Pain Assessment: No/denies pain    Home Living Family/patient expects to be discharged to:: Private residence Living Arrangements: Spouse/significant other Available Help at Discharge: Family;Available  PRN/intermittently Type of Home: House Home Access: Stairs to enter Entrance Stairs-Rails: None Entrance Stairs-Number of Steps: 3 Home Layout: One level Home Equipment: None      Prior Function Level of Independence: Independent         Comments: works full time, Dispensing optician        Extremity/Trunk Assessment   Upper Extremity Assessment Upper Extremity Assessment: Defer to OT evaluation    Lower Extremity Assessment Lower Extremity Assessment: Overall WFL for tasks assessed       Communication   Communication: No difficulties  Cognition Arousal/Alertness: Awake/alert Behavior During Therapy: WFL for tasks assessed/performed Overall Cognitive Status: Within Functional Limits for tasks assessed                                        General Comments      Exercises     Assessment/Plan    PT Assessment Patent does not need any further PT services  PT Problem List Decreased strength;Decreased coordination       PT Treatment Interventions Therapeutic activities;Gait training;Functional mobility training;Therapeutic exercise;Stair training;Balance training;Patient/family education    PT Goals (Current goals can be found in the Care Plan section)  Acute Rehab PT Goals Patient Stated Goal: to return home PT Goal Formulation: With patient Time For Goal Achievement: 05/04/20 Potential to Achieve Goals: Good    Frequency Min  2X/week   Barriers to discharge        Co-evaluation               AM-PAC PT "6 Clicks" Mobility  Outcome Measure Help needed turning from your back to your side while in a flat bed without using bedrails?: None Help needed moving from lying on your back to sitting on the side of a flat bed without using bedrails?: None Help needed moving to and from a bed to a chair (including a wheelchair)?: None Help needed standing up from a chair using your arms (e.g., wheelchair or bedside chair)?:  None Help needed to walk in hospital room?: None Help needed climbing 3-5 steps with a railing? : None 6 Click Score: 24    End of Session   Activity Tolerance: Patient tolerated treatment well Patient left: in bed;with call bell/phone within reach;with nursing/sitter in room Nurse Communication: Mobility status      Time: 3299-2426 PT Time Calculation (min) (ACUTE ONLY): 15 min   Charges:   PT Evaluation $PT Eval Low Complexity: 1 Low          Jouri Threat, PT, GCS 04/27/20,9:55 AM

## 2020-04-27 NOTE — Progress Notes (Signed)
PHARMACIST - PHYSICIAN COMMUNICATION  CONCERNING:  Enoxaparin (Lovenox) for DVT Prophylaxis    RECOMMENDATION: Patient was prescribed enoxaprin 40mg  q24 hours for VTE prophylaxis.   Filed Weights   04/26/20 1008 04/26/20 2221  Weight: 106.6 kg (235 lb) 103.5 kg (228 lb 2.8 oz)    Body mass index is 34.69 kg/m.  Estimated Creatinine Clearance: 105.9 mL/min (by C-G formula based on SCr of 0.75 mg/dL).   Based on [place] patient is candidate for enoxaparin 0.5mg /kg TBW SQ every 24 hours based on BMI being >30.   DESCRIPTION: Pharmacy has adjusted enoxaparin dose per [facility] policy.  Patient is now receiving enoxaparin 52 mg every 24 hours    Pernell Dupre, PharmD, BCPS Clinical Pharmacist 04/27/2020 9:55 AM

## 2020-04-29 ENCOUNTER — Telehealth: Payer: Self-pay

## 2020-04-29 NOTE — Telephone Encounter (Signed)
Patient came in the office on Fri 04-29-2020 to let us know that he had a stroke but was a light one. See his record.

## 2020-05-03 NOTE — Telephone Encounter (Signed)
Tried calling the patient 2 times and lvm to call and schedule a [facility] with [facility] and it can be 20 min per [employer]

## 2020-05-03 NOTE — Telephone Encounter (Signed)
Please advise where we can put him for a 40 min slot. Have looked but nothing in the next few weeks.

## 2020-05-05 ENCOUNTER — Ambulatory Visit: Payer: Self-pay | Admitting: *Deleted

## 2020-05-05 NOTE — Telephone Encounter (Signed)
C/o persistent numbness and tingling in left arm , leg and face since having a stroke 04/26/20. Patient reports he went to [facility] last Monday and had elevated B/P and was sent to [facility] ED for a stroke. Patient was sent home after one day and went back to work on Monday 05/03/20 and noted Monday or Tuesday having "blotchy vision". Denies visual issues at this time or worsening symptoms of stroke at this time. Patient reports he called in to work today due to feeling tired. Denies chest pain, difficulty breathing, or decrease in balance or worsening N/T or weakness in left side. C/o dizziness at work. Unable to check B/P at home. C/o problems with sleeping. Denies issues with bowel and bladder.  Last seen in clinic with Dr. Roxan Hockey. PCP not accepting new patients. Care advise given.  Patient verbalized understanding to call back or go to ED if symptoms worsen. Please advise for which provider to set up patient for f/u. Patient will be awaiting call for appt. Unable to call clinic due to lunch at practice .  Reason for Disposition . [1] Numbness (i.e., loss of sensation) of the face, arm / hand, or leg / foot on one side of the body AND [2] gradual onset (e.g., days to weeks) AND [3] present now  Answer Assessment - Initial Assessment Questions 1. SYMPTOM: "What is the main symptom you are concerned about?" (e.g., weakness, numbness)     Continued to have N/T on left side after having stroke on 04/26/20.  2. ONSET: "When did this start?" (minutes, hours, days; while sleeping)     04/26/20 3. LAST NORMAL: "When was the last time you were normal (no symptoms)?"     04/26/31 4. PATTERN "Does this come and go, or has it been constant since it started?"  "Is it present now?"     Constant and continues to have numbness and tingling on left side since stroke  5. CARDIAC SYMPTOMS: "Have you had any of the following symptoms: chest pain, difficulty breathing, palpitations?"     Denies   6. NEUROLOGIC SYMPTOMS: "Have you had any of the following symptoms: headache, dizziness, vision loss, double vision, changes in speech, unsteady on your feet?"     No new symptoms now . "blotching vision yesterday" 7. OTHER SYMPTOMS: "Do you have any other symptoms?"     No new symptoms today . 8. PREGNANCY: "Is there any chance you are pregnant?" "When was your last menstrual period?"     na  Protocols used: NEUROLOGIC DEFICIT-A-AH

## 2020-05-05 NOTE — Telephone Encounter (Signed)
lvm for pt to call back to be scheduled with Dr Rory Percy for next available.

## 2020-05-17 ENCOUNTER — Ambulatory Visit: Payer: BC Managed Care – PPO | Admitting: Family Medicine

## 2020-05-17 ENCOUNTER — Other Ambulatory Visit: Payer: Self-pay

## 2020-05-17 ENCOUNTER — Encounter: Payer: Self-pay | Admitting: Family Medicine

## 2020-05-17 VITALS — BP 144/100 | HR 102 | Temp 98.3°F | Resp 16 | Ht 69.0 in | Wt 228.1 lb

## 2020-05-17 DIAGNOSIS — E1159 Type 2 diabetes mellitus with other circulatory complications: Secondary | ICD-10-CM

## 2020-05-17 DIAGNOSIS — I10 Essential (primary) hypertension: Secondary | ICD-10-CM

## 2020-05-17 DIAGNOSIS — N529 Male erectile dysfunction, unspecified: Secondary | ICD-10-CM | POA: Diagnosis not present

## 2020-05-17 DIAGNOSIS — I639 Cerebral infarction, unspecified: Secondary | ICD-10-CM

## 2020-05-17 DIAGNOSIS — Z720 Tobacco use: Secondary | ICD-10-CM

## 2020-05-17 DIAGNOSIS — E782 Mixed hyperlipidemia: Secondary | ICD-10-CM

## 2020-05-17 MED ORDER — METFORMIN HCL ER 500 MG PO TB24: 500.0000 mg | ORAL_TABLET | Freq: Every day | ORAL | 3 refills | Status: DC

## 2020-05-17 NOTE — Assessment & Plan Note (Signed)
With improved sensory deficits though with persistent slight numbness and tingling to L hand. Motor control and strength preserved. Will work to control BP, HLD, newly diagnosed diabetes. Refer to neuro for f/u.

## 2020-05-17 NOTE — Assessment & Plan Note (Signed)
Recent A1c 7.0. Start metformin today and follow up in 3 months for repeat A1c.

## 2020-05-17 NOTE — Progress Notes (Signed)
    SUBJECTIVE:   CHIEF COMPLAINT / HPI:   Patient Active Problem List   Diagnosis Date Noted  . Stroke ([place]) 04/26/2020  . HTN (hypertension) 04/26/2020  . Tobacco abuse 04/26/2020  . Insomnia 10/30/2019  . Current moderate episode of major depressive disorder without prior episode ([place]) 10/30/2019  . Fatigue 10/30/2019  . Mixed hyperlipidemia 08/28/2019  . Polyp of colon 08/28/2019  . Erectile dysfunction 08/28/2019  . OSA (obstructive sleep apnea) 08/28/2019  . Acute blood loss anemia 02/06/2019  . GIB (gastrointestinal bleeding) 02/06/2019  . GI bleed 02/06/2019  . Class 1 obesity due to excess calories with serious comorbidity and body mass index (BMI) of 34.0 to 34.9 in adult 09/19/2015  . Eczema 02/24/2015  . Elevated cholesterol with elevated triglycerides 08/26/2014  . Hypertension goal BP (blood pressure) < 140/90 08/26/2014  . Plantar verruca 08/26/2014  . Type 2 diabetes mellitus ([place]) 08/26/2014   ER FOLLOW UP Time since discharge: 1 month Hospital/facility: [facility] Diagnosis: stroke Procedures/tests:  - CT Head NAICA - MRI brain acute infarct in R thalamus. Incidental 46mm lesion with surrounding gliosis in postcentral gyrus, recommend f/u MRI in 4-6 mths. - CTA with severe stenosis in A2/A3 area. - ECHO nl EF, G1DD - A1c 7.0% Consultants: neuro New medications:  - lipitor, plavix Discharge instructions:  F/u with neuro, PCP Status: better  - numbness and tingling in face and leg better. Arm persistent.  - no issues walking.  - hasn't been back at work yet, wants to go back Wednesday. - no motor loss, just sensory. - not smoking anymore.     OBJECTIVE:   BP (!) 144/100   Pulse (!) 102   Temp 98.3 F (36.8 C) (Oral)   Resp 16   Ht 5\' 9"  (1.753 m)   Wt 228 lb 1.6 oz (103.5 kg)   SpO2 97%   BMI 33.68 kg/m   Gen: well appearing, in NAD Ext: WWP, no edema MSK: Full ROM, strength 5/5 to U/LE bilaterally, normal gait.  No edema.  Neuro: Alert and  oriented, speech normal.  Optic field normal. Extraocular movements intact.  Intact sensation to light touch of face and extremities bilaterally with slightly diminished sensation to L hand. Slight tingling with light touch to L face, arm.  Hearing grossly intact bilaterally.  Tongue protrudes normally with no deviation.  Shoulder shrug, smile symmetric.   ASSESSMENT/PLAN:   Stroke [facility]) With improved sensory deficits though with persistent slight numbness and tingling to L hand. Motor control and strength preserved. Will work to control BP, HLD, newly diagnosed diabetes. Refer to neuro for f/u.   HTN (hypertension) Slightly elevated today, continue to monitor at f/u.  Type 2 diabetes mellitus (HCC) Recent A1c 7.0. Start metformin today and follow up in 3 months for repeat A1c.  Mixed hyperlipidemia Continue statin  Tobacco abuse Has quit since stroke 04/26/20. Congratulated efforts, continue to monitor progress.    Myles Gip, DO

## 2020-05-17 NOTE — Assessment & Plan Note (Signed)
Has quit since stroke 04/26/20. Congratulated efforts, continue to monitor progress.

## 2020-05-17 NOTE — Patient Instructions (Signed)
It was great to see you!  Our plans for today:  - We are starting a new medication for your blood sugar, let us know if you don't tolerate this.  - Let us know if you don't hear about a neurology appointment in the next few weeks.  - come back in 3 months for follow up.   Take care and seek immediate care sooner if you develop any concerns.   Dr. Ky Barban

## 2020-05-17 NOTE — Assessment & Plan Note (Signed)
Slightly elevated today, continue to monitor at f/u.

## 2020-05-17 NOTE — Assessment & Plan Note (Signed)
Continue statin. 

## 2020-05-31 ENCOUNTER — Telehealth: Payer: Self-pay | Admitting: Family Medicine

## 2020-05-31 NOTE — Telephone Encounter (Signed)
Pt called and stated that ever since he started taking metFORMIN (GLUCOPHAGE-XR) 500 MG 24 hr tablet  Last week he has been itching really bad/ no rash but itching like he has one/ Pt thinks it may be a reaction to this medication/ Pt asked for advice on if he need another medication or what he can do / please advise

## 2020-05-31 NOTE — Telephone Encounter (Signed)
I called patient and left vm to stop metformin to see if itching would stop as mentioned by Dr.Rumball.

## 2020-05-31 NOTE — Telephone Encounter (Signed)
We'll have patient stop taking the metformin. If his itching improves, we will try a different medication to control his blood sugars. If his itching persists, he should come for an in-person appointment for evaluation.

## 2020-08-06 DIAGNOSIS — M7021 Olecranon bursitis, right elbow: Secondary | ICD-10-CM | POA: Diagnosis not present

## 2020-09-07 ENCOUNTER — Encounter: Payer: Self-pay | Admitting: Unknown Physician Specialty

## 2020-09-07 ENCOUNTER — Other Ambulatory Visit: Payer: Self-pay

## 2020-09-07 ENCOUNTER — Ambulatory Visit (INDEPENDENT_AMBULATORY_CARE_PROVIDER_SITE_OTHER): Payer: BC Managed Care – PPO | Admitting: Unknown Physician Specialty

## 2020-09-07 VITALS — BP 126/72 | HR 97 | Temp 98.0°F | Resp 16 | Ht 69.0 in | Wt 235.8 lb

## 2020-09-07 DIAGNOSIS — E6609 Other obesity due to excess calories: Secondary | ICD-10-CM | POA: Diagnosis not present

## 2020-09-07 DIAGNOSIS — I10 Essential (primary) hypertension: Secondary | ICD-10-CM

## 2020-09-07 DIAGNOSIS — N529 Male erectile dysfunction, unspecified: Secondary | ICD-10-CM

## 2020-09-07 DIAGNOSIS — R319 Hematuria, unspecified: Secondary | ICD-10-CM

## 2020-09-07 DIAGNOSIS — E1159 Type 2 diabetes mellitus with other circulatory complications: Secondary | ICD-10-CM | POA: Diagnosis not present

## 2020-09-07 DIAGNOSIS — E782 Mixed hyperlipidemia: Secondary | ICD-10-CM | POA: Diagnosis not present

## 2020-09-07 DIAGNOSIS — Z6834 Body mass index (BMI) 34.0-34.9, adult: Secondary | ICD-10-CM | POA: Diagnosis not present

## 2020-09-07 LAB — POCT GLYCOSYLATED HEMOGLOBIN (HGB A1C): Hemoglobin A1C: 7.3 % — AB (ref 4.0–5.6)

## 2020-09-07 MED ORDER — EMPAGLIFLOZIN 25 MG PO TABS: 25.0000 mg | ORAL_TABLET | Freq: Every day | ORAL | 90 refills | Status: DC

## 2020-09-07 MED ORDER — SILDENAFIL CITRATE 50 MG PO TABS: 50.0000 mg | ORAL_TABLET | Freq: Every day | ORAL | 2 refills | Status: DC | PRN

## 2020-09-07 NOTE — Assessment & Plan Note (Signed)
Stable, continue present medications.   

## 2020-09-07 NOTE — Assessment & Plan Note (Signed)
Discussed diet.  He does drink quite a bit of sweet tea.  Discussed cutting this out.  Refer to chronic care management for this and diabetes

## 2020-09-07 NOTE — Progress Notes (Signed)
BP 126/72   Pulse 97   Temp 98 F (36.7 C) (Oral)   Resp 16   Ht 5\' 9"  (1.753 m)   Wt 235 lb 12.8 oz (107 kg)   SpO2 99%   BMI 34.82 kg/m    Subjective:    Patient ID: Paul Ingram, male    DOB: Mar 31, 1955, 65 y.o.   MRN: [id number]  HPI: Paul Ingram is a 65 y.o. male  Chief Complaint  Patient presents with   Follow-up    Patient had stroke on 04/27/20   Diabetes Last Hgb A1C was 7.0.  Pt surprised that he has diabetes.  Unable to tolerte Metformin No hypoglycemic episodes No hyperglycemic episodes Feet problems:no - left sided UE tingling secondary to stroke Blood Sugars averaging: not checking eye exam within last year Last Hgb A1C: 7.0  Hypertension  Using medications without difficulty Average home BPs: Not checking  Using medication without problems or lightheadedness No chest pain with exertion or shortness of breath No Edema  Elevated Cholesterol Using medications without problems No Muscle aches  Diet:  Exercise:Works physical job   Relevant past medical, surgical, family and social history reviewed and updated as indicated. Interim medical history since our last visit reviewed. Allergies and medications reviewed and updated.  Review of Systems  Constitutional:        "Sleepy"  Never had a sleep study  Genitourinary:        Blood in urine yesterday.  No pain   Per HPI unless specifically indicated above     Objective:    BP 126/72   Pulse 97   Temp 98 F (36.7 C) (Oral)   Resp 16   Ht 5\' 9"  (1.753 m)   Wt 235 lb 12.8 oz (107 kg)   SpO2 99%   BMI 34.82 kg/m   Wt Readings from Last 3 Encounters:  09/07/20 235 lb 12.8 oz (107 kg)  05/17/20 228 lb 1.6 oz (103.5 kg)  04/26/20 228 lb 2.8 oz (103.5 kg)    Physical Exam Constitutional:      General: He is not in acute distress.    Appearance: Normal appearance. He is well-developed.  HENT:     Head: Normocephalic and atraumatic.  Eyes:     General: Lids are normal. No scleral  icterus.       Right eye: No discharge.        Left eye: No discharge.     Conjunctiva/sclera: Conjunctivae normal.  Neck:     Vascular: No carotid bruit or JVD.  Cardiovascular:     Rate and Rhythm: Normal rate and regular rhythm.     Heart sounds: Normal heart sounds.  Pulmonary:     Effort: Pulmonary effort is normal. No respiratory distress.     Breath sounds: Normal breath sounds.  Abdominal:     Palpations: There is no hepatomegaly or splenomegaly.  Musculoskeletal:        General: Normal range of motion.     Cervical back: Normal range of motion and neck supple.  Skin:    General: Skin is warm and dry.     Coloration: Skin is not pale.     Findings: No rash.  Neurological:     Mental Status: He is alert and oriented to person, place, and time.  Psychiatric:        Behavior: Behavior normal.        Thought Content: Thought content normal.  Judgment: Judgment normal.    Results for orders placed or performed in visit on 09/07/20  POCT HgB A1C  Result Value Ref Range   Hemoglobin A1C 7.3 (A) 4.0 - 5.6 %   HbA1c POC (<> result, manual entry)     HbA1c, POC (prediabetic range)     HbA1c, POC (controlled diabetic range)        Assessment & Plan:   Problem List Items Addressed This Visit       Unprioritized   Class 1 obesity due to excess calories with serious comorbidity and body mass index (BMI) of 34.0 to 34.9 in adult    Discussed diet.  He does drink quite a bit of sweet tea.  Discussed cutting this out.  Refer to chronic care management for this and diabetes       Relevant Medications   empagliflozin (JARDIANCE) 25 MG TABS tablet   Other Relevant Orders   TSH   Elevated cholesterol with elevated triglycerides    Stable, continue present medications.         Relevant Medications   sildenafil (VIAGRA) 50 MG tablet   Erectile dysfunction   Relevant Medications   sildenafil (VIAGRA) 50 MG tablet   HTN (hypertension) - Primary    Stable,  continue present medications.         Relevant Medications   sildenafil (VIAGRA) 50 MG tablet   Other Relevant Orders   Comprehensive metabolic panel   Mixed hyperlipidemia   Relevant Medications   sildenafil (VIAGRA) 50 MG tablet   Other Relevant Orders   Lipid panel   Type 2 diabetes mellitus ([place])    Pt refusing lifestyle center.  Intolerant to Metformin.  Will start Jardiance.  Sife effects discussed.       Relevant Medications   empagliflozin (JARDIANCE) 25 MG TABS tablet   Other Relevant Orders   POCT HgB A1C (Completed)   AMB Referral to [place], urine   Other Visit Diagnoses     Hematuria, unspecified type       New problem.  Noted x 1.  Check urinalysis.  Will call with results   Relevant Orders   Urinalysis       Suspect needs a sleep study.    Follow up plan: Return in about 3 months (around 12/08/2020).

## 2020-09-07 NOTE — Assessment & Plan Note (Signed)
Pt refusing lifestyle center.  Intolerant to Metformin.  Will start Jardiance.  Sife effects discussed.

## 2020-09-08 ENCOUNTER — Telehealth: Payer: Self-pay

## 2020-09-08 DIAGNOSIS — R319 Hematuria, unspecified: Secondary | ICD-10-CM | POA: Diagnosis not present

## 2020-09-08 DIAGNOSIS — R7989 Other specified abnormal findings of blood chemistry: Secondary | ICD-10-CM | POA: Diagnosis not present

## 2020-09-08 DIAGNOSIS — Z87891 Personal history of nicotine dependence: Secondary | ICD-10-CM | POA: Diagnosis not present

## 2020-09-08 DIAGNOSIS — I10 Essential (primary) hypertension: Secondary | ICD-10-CM | POA: Diagnosis not present

## 2020-09-08 DIAGNOSIS — R791 Abnormal coagulation profile: Secondary | ICD-10-CM | POA: Diagnosis not present

## 2020-09-08 DIAGNOSIS — R361 Hematospermia: Secondary | ICD-10-CM | POA: Diagnosis not present

## 2020-09-08 DIAGNOSIS — R310 Gross hematuria: Secondary | ICD-10-CM | POA: Diagnosis not present

## 2020-09-08 LAB — URINALYSIS
Bilirubin Urine: NEGATIVE
Glucose, UA: NEGATIVE
Hgb urine dipstick: NEGATIVE
Ketones, ur: NEGATIVE
Leukocytes,Ua: NEGATIVE
Nitrite: NEGATIVE
Specific Gravity, Urine: 1.02 (ref 1.001–1.035)
pH: 5.5 (ref 5.0–8.0)

## 2020-09-08 LAB — COMPREHENSIVE METABOLIC PANEL
AG Ratio: 1.5 (calc) (ref 1.0–2.5)
ALT: 52 U/L — ABNORMAL HIGH (ref 9–46)
AST: 40 U/L — ABNORMAL HIGH (ref 10–35)
Albumin: 4.2 g/dL (ref 3.6–5.1)
Alkaline phosphatase (APISO): 55 U/L (ref 35–144)
BUN: 8 mg/dL (ref 7–25)
CO2: 25 mmol/L (ref 20–32)
Calcium: 9.2 mg/dL (ref 8.6–10.3)
Chloride: 106 mmol/L (ref 98–110)
Creat: 0.83 mg/dL (ref 0.70–1.25)
Globulin: 2.8 g/dL (calc) (ref 1.9–3.7)
Glucose, Bld: 169 mg/dL — ABNORMAL HIGH (ref 65–99)
Potassium: 3.7 mmol/L (ref 3.5–5.3)
Sodium: 142 mmol/L (ref 135–146)
Total Bilirubin: 0.6 mg/dL (ref 0.2–1.2)
Total Protein: 7 g/dL (ref 6.1–8.1)

## 2020-09-08 LAB — LIPID PANEL
Cholesterol: 169 mg/dL (ref <200)
HDL: 73 mg/dL (ref >=40)
LDL Cholesterol (Calc): 70 mg/dL (calc)
Non-HDL Cholesterol (Calc): 96 mg/dL (calc) (ref <130)
Total CHOL/HDL Ratio: 2.3 (calc) (ref <5.0)
Triglycerides: 190 mg/dL — ABNORMAL HIGH (ref <150)

## 2020-09-08 LAB — MICROALBUMIN, URINE: Microalb, Ur: 8.3 mg/dL

## 2020-09-08 LAB — TSH: TSH: 1.17 mIU/L (ref 0.40–4.50)

## 2020-09-08 NOTE — Telephone Encounter (Signed)
Pt. Calling to report he had blood in his ejaculate this morning and is on his way to "[place] [facility]." Wants his PCP to be aware.

## 2020-09-16 ENCOUNTER — Telehealth: Payer: Self-pay | Admitting: *Deleted

## 2020-09-16 NOTE — Chronic Care Management (AMB) (Signed)
  Chronic Care Management   Outreach Note  09/16/2020 Name: Paul Ingram MRN: [id number] DOB: [birth year]  [name] is a 65 y.o. year old male who is a primary care patient of [place]. I reached out to [place] by phone today in response to a referral sent by Mr. Trevonte Ashkar Edwards's PCP [facility]     An unsuccessful telephone outreach was attempted today. The patient was referred to the case management team for assistance with care management and care coordination.   Follow Up Plan: A HIPAA compliant phone message was left for the patient providing contact information and requesting a return call.  If patient returns call to provider office, please advise to call Embedded Care Management Care Guide Savvas Roper at [place], [place] [facility]  Direct Dial: [phone]

## 2020-09-17 NOTE — Chronic Care Management (AMB) (Signed)
  Chronic Care Management   Note  09/17/2020 Name: Paul Ingram MRN: [id number] DOB: May 05, 1955  Paul Ingram is a 65 y.o. year old male who is a primary care patient of [place]. I reached out to [place] by phone today in response to a referral sent by [name]'s PCP [facility]     [name] was given information about Chronic Care Management services today including:  CCM service includes personalized support from designated clinical staff supervised by his physician, including individualized plan of care and coordination with other care providers 24/7 contact phone numbers for assistance for urgent and routine care needs. Service will only be billed when office clinical staff spend 20 minutes or more in a month to coordinate care. Only one practitioner may furnish and bill the service in a calendar month. The patient may stop CCM services at any time (effective at the end of the month) by phone call to the office staff. The patient will be responsible for cost sharing (co-pay) of up to 20% of the service fee (after annual deductible is met).  Patient agreed to services and verbal consent obtained.   Follow up plan: Telephone appointment with care management team member scheduled for:09/23/2020  Julian Hy, [place] [facility]  Direct Dial: [phone]

## 2020-09-23 ENCOUNTER — Ambulatory Visit: Payer: BC Managed Care – PPO

## 2020-09-23 DIAGNOSIS — E1159 Type 2 diabetes mellitus with other circulatory complications: Secondary | ICD-10-CM | POA: Diagnosis not present

## 2020-10-01 ENCOUNTER — Ambulatory Visit (INDEPENDENT_AMBULATORY_CARE_PROVIDER_SITE_OTHER): Payer: BC Managed Care – PPO

## 2020-10-01 DIAGNOSIS — E1159 Type 2 diabetes mellitus with other circulatory complications: Secondary | ICD-10-CM | POA: Diagnosis not present

## 2020-10-01 DIAGNOSIS — I10 Essential (primary) hypertension: Secondary | ICD-10-CM | POA: Diagnosis not present

## 2020-10-01 DIAGNOSIS — Z79899 Other long term (current) drug therapy: Secondary | ICD-10-CM

## 2020-10-01 NOTE — Chronic Care Management (AMB) (Signed)
  Chronic Care Management      Name: DRACEN REIGLE MRN: [id number] DOB: November 11, 1955  Primary Care Provider: Delsa Grana, PA-C Reason for referral : Chronic Care Management   Paul Ingram is a 65 y.o. year old male who is a primary care patient of Delsa Grana, [place]. The CCM team was consulted for assistance with chronic disease management and care coordination.   Mr. Haydel was scheduled for initial outreach today. Remains interested in chronic disease management but reports being busy at the time of the call. Reports working most of the week and prefers to complete outreach on Friday mornings. Agreeable to outreach next week. Agreed to call  with urgent concerns if needed.   PLAN A member of the care management team will follow up with Mr. Pavia next week.   [name] Care Management [facility] [phone]

## 2020-10-06 NOTE — Chronic Care Management (AMB) (Signed)
  Chronic Care Management      Name: Paul Ingram MRN: [id number] DOB: [birth year]  Primary Care Provider: Delsa Grana, PA-C Reason for referral : Chronic Care Management   Paul Ingram is a 64 y.o. year old male who is a primary care patient of Delsa Grana, [place]. The CCM team was consulted for assistance with chronic disease management and care coordination.  Review of Paul Ingram's status, including review of consultants reports, relevant labs and test results was conducted today. Collaboration with appropriate care team members was performed as part of the comprehensive evaluation and provision of chronic care management services.     Paul Ingram was scheduled to complete the initial chronic care management today. He was unable to complete  the outreach as planned but available to review medications and allergies during the call. He recalls being unable to tolerate one of his medications but unable to recall the name of the medication or the provider's instructions for adjusting the dose. Will follow up with his primary care provider to discuss.  He is agreeable to completing care management outreach within the next two weeks.    Medications: Outpatient Encounter Medications as of 10/01/2020  Medication Sig   amLODipine (NORVASC) 10 MG tablet Take 1 tablet (10 mg total) by mouth daily.   atorvastatin (LIPITOR) 40 MG tablet Take 1 tablet (40 mg total) by mouth daily.   clopidogrel (PLAVIX) 75 MG tablet Take 1 tablet (75 mg total) by mouth daily.   metFORMIN (GLUCOPHAGE-XR) 500 MG 24 hr tablet Take 500 mg by mouth daily with breakfast.   pantoprazole (PROTONIX) 40 MG tablet Take 1 tablet (40 mg total) by mouth daily at 12 noon.   empagliflozin (JARDIANCE) 25 MG TABS tablet Take 1 tablet (25 mg total) by mouth daily before breakfast.   nicotine (NICODERM CQ - DOSED IN MG/24 HOURS) 21 mg/24hr patch Place 1 patch (21 mg total) onto the skin daily. (Patient not taking: Reported on 09/07/2020)    sildenafil (VIAGRA) 50 MG tablet Take 1 tablet (50 mg total) by mouth daily as needed for erectile dysfunction. Take 30 to 60 minutes prior to planned activities   No facility-administered encounter medications on file as of 10/01/2020.      PLAN Will complete outreach in two weeks.     [name] Care Management [facility] [phone]

## 2020-10-11 ENCOUNTER — Other Ambulatory Visit: Payer: Self-pay

## 2020-10-11 ENCOUNTER — Emergency Department
Admission: EM | Admit: 2020-10-11 | Discharge: 2020-10-11 | Disposition: A | Discharge status: Home / Self Care | Payer: BC Managed Care – PPO | Attending: Emergency Medicine | Admitting: Emergency Medicine

## 2020-10-11 DIAGNOSIS — F17210 Nicotine dependence, cigarettes, uncomplicated: Secondary | ICD-10-CM | POA: Insufficient documentation

## 2020-10-11 DIAGNOSIS — Z23 Encounter for immunization: Secondary | ICD-10-CM | POA: Diagnosis not present

## 2020-10-11 DIAGNOSIS — Z7902 Long term (current) use of antithrombotics/antiplatelets: Secondary | ICD-10-CM | POA: Diagnosis not present

## 2020-10-11 DIAGNOSIS — E119 Type 2 diabetes mellitus without complications: Secondary | ICD-10-CM | POA: Insufficient documentation

## 2020-10-11 DIAGNOSIS — S6992XA Unspecified injury of left wrist, hand and finger(s), initial encounter: Secondary | ICD-10-CM | POA: Diagnosis not present

## 2020-10-11 DIAGNOSIS — S61211A Laceration without foreign body of left index finger without damage to nail, initial encounter: Secondary | ICD-10-CM

## 2020-10-11 DIAGNOSIS — W268XXA Contact with other sharp object(s), not elsewhere classified, initial encounter: Secondary | ICD-10-CM | POA: Insufficient documentation

## 2020-10-11 DIAGNOSIS — I10 Essential (primary) hypertension: Secondary | ICD-10-CM | POA: Diagnosis not present

## 2020-10-11 DIAGNOSIS — Z7984 Long term (current) use of oral hypoglycemic drugs: Secondary | ICD-10-CM | POA: Diagnosis not present

## 2020-10-11 DIAGNOSIS — Z79899 Other long term (current) drug therapy: Secondary | ICD-10-CM | POA: Insufficient documentation

## 2020-10-11 MED ORDER — TETANUS-DIPHTH-ACELL PERTUSSIS 5-2.5-18.5 LF-MCG/0.5 IM SUSY
0.5000 mL | PREFILLED_SYRINGE | Freq: Once | INTRAMUSCULAR | Status: AC
  Administered 2020-10-11: 0.5 mL via INTRAMUSCULAR
  Filled 2020-10-11: qty 0.5

## 2020-10-11 MED ORDER — TETANUS-DIPHTHERIA TOXOIDS TD 5-2 LFU IM INJ
0.5000 mL | INJECTION | Freq: Once | INTRAMUSCULAR | Status: DC
  Filled 2020-10-11: qty 0.5
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
## 2020-10-11 NOTE — ED Triage Notes (Signed)
Pt comes with c/o laceration to left pointer finger the patient has towel covering finger at this time bleeding controlled.

## 2020-10-18 ENCOUNTER — Telehealth: Payer: Self-pay

## 2020-10-18 ENCOUNTER — Ambulatory Visit: Payer: Self-pay

## 2020-10-18 NOTE — Telephone Encounter (Signed)
Called pt and LM on VM that it is ok to wait until tomorrow to get sutures out. Asked if pt could call back to let me know that he got my message and to call back for further questions. Call back number provided.

## 2020-10-18 NOTE — Telephone Encounter (Signed)
Copied from [place] [phone]. Topic: Appointment Scheduling - Scheduling Inquiry for Clinic >> Oct 15, 2020  3:11 PM Lennox Solders wrote: Reason for CRM: Pt wife is calling and her husband his 4 stitches in finger at [facility] and needs the stitches removed on 7/25 please advise

## 2020-10-18 NOTE — Telephone Encounter (Signed)
Summary: Clinical Advice   Patient was advised by the urgent care to have suture removed from left index finger on the 7th day, today is the 7th days, patient PCP has no available appointments today. Can patient wait until 10/19/2020 at 8am appointment for suture removal, please advise     Attempted to call patient to make sure he has received message from nurse- left message to call back to verify.

## 2020-10-18 NOTE — Telephone Encounter (Signed)
Third attempt to reach pt and LM on VM each time to call back. Did LM on VM that suture removal can wait until tomorrow motning with OV> Advised pt to call back to let us know he received the message

## 2020-10-18 NOTE — Telephone Encounter (Signed)
Lvm to call us back and we don't jabe an appt for sucture removal today but we have a few left for Tuesday especially with cheryl wicker.

## 2020-10-19 ENCOUNTER — Other Ambulatory Visit: Payer: Self-pay

## 2020-10-19 ENCOUNTER — Encounter: Payer: Self-pay | Admitting: Unknown Physician Specialty

## 2020-10-19 ENCOUNTER — Ambulatory Visit: Payer: BC Managed Care – PPO | Admitting: Unknown Physician Specialty

## 2020-10-19 VITALS — BP 142/80 | HR 98 | Temp 98.0°F | Resp 18 | Ht 69.0 in | Wt 230.9 lb

## 2020-10-19 DIAGNOSIS — Z4802 Encounter for removal of sutures: Secondary | ICD-10-CM

## 2020-10-19 NOTE — Progress Notes (Signed)
BP (!) 142/80   Pulse 98   Temp 98 F (36.7 C) (Oral)   Resp 18   Ht '5\' 9"'$  (1.753 m)   Wt 230 lb 14.4 oz (104.7 kg)   SpO2 97%   BMI 34.10 kg/m    Subjective:    Patient ID: Paul Ingram, male    DOB: [birth year], 65 y.o.   MRN: [id number]  HPI: Paul Ingram is a 65 y.o. male  Chief Complaint  Patient presents with   Finger Injury    1 week ago, remove stitches   Suture removal- 4 stitches placed in ER 7 days ago.  Denies pain, erythema, or drainage from site  Relevant past medical, surgical, family and social history reviewed and updated as indicated. Interim medical history since our last visit reviewed. Allergies and medications reviewed and updated.  Review of Systems  Per HPI unless specifically indicated above     Objective:    BP (!) 142/80   Pulse 98   Temp 98 F (36.7 C) (Oral)   Resp 18   Ht '5\' 9"'$  (1.753 m)   Wt 230 lb 14.4 oz (104.7 kg)   SpO2 97%   BMI 34.10 kg/m   Wt Readings from Last 3 Encounters:  10/19/20 230 lb 14.4 oz (104.7 kg)  10/11/20 235 lb 14.3 oz (107 kg)  09/07/20 235 lb 12.8 oz (107 kg)    Physical Exam Constitutional:      General: He is not in acute distress.    Appearance: Normal appearance. He is well-developed.  HENT:     Head: Normocephalic and atraumatic.  Eyes:     General: Lids are normal. No scleral icterus.       Right eye: No discharge.        Left eye: No discharge.     Conjunctiva/sclera: Conjunctivae normal.  Cardiovascular:     Rate and Rhythm: Normal rate.  Pulmonary:     Effort: Pulmonary effort is normal.  Abdominal:     Palpations: There is no hepatomegaly or splenomegaly.  Musculoskeletal:        General: Normal range of motion.  Skin:    Coloration: Skin is not pale.     Findings: No rash.     Comments: 4 sutures right index finger.  Sutures removed with edges well approximated.  No erythema or drainage  Neurological:     Mental Status: He is alert and oriented to person, place, and time.   Psychiatric:        Behavior: Behavior normal.        Thought Content: Thought content normal.        Judgment: Judgment normal.    Results for orders placed or performed in visit on 09/07/20  Comprehensive metabolic panel  Result Value Ref Range   Glucose, Bld 169 (H) 65 - 99 mg/dL   BUN 8 7 - 25 mg/dL   Creat 0.83 0.70 - 1.25 mg/dL   BUN/Creatinine Ratio NOT APPLICABLE 6 - 22 (calc)   Sodium 142 135 - 146 mmol/L   Potassium 3.7 3.5 - 5.3 mmol/L   Chloride 106 98 - 110 mmol/L   CO2 25 20 - 32 mmol/L   Calcium 9.2 8.6 - 10.3 mg/dL   Total Protein 7.0 6.1 - 8.1 g/dL   Albumin 4.2 3.6 - 5.1 g/dL   Globulin 2.8 1.9 - 3.7 g/dL (calc)   AG Ratio 1.5 1.0 - 2.5 (calc)   Total Bilirubin 0.6 0.2 -  1.2 mg/dL   Alkaline phosphatase (APISO) 55 35 - 144 U/L   AST 40 (H) 10 - 35 U/L   ALT 52 (H) 9 - 46 U/L  Lipid panel  Result Value Ref Range   Cholesterol 169 <200 mg/dL   HDL 73 > OR = 40 mg/dL   Triglycerides 190 (H) <150 mg/dL   LDL Cholesterol (Calc) 70 mg/dL (calc)   Total CHOL/HDL Ratio 2.3 <5.0 (calc)   Non-HDL Cholesterol (Calc) 96 <130 mg/dL (calc)  TSH  Result Value Ref Range   TSH 1.17 0.40 - 4.50 mIU/L  Urinalysis  Result Value Ref Range   Color, Urine YELLOW YELLOW   APPearance CLEAR CLEAR   Specific Gravity, Urine 1.020 1.001 - 1.035   pH 5.5 5.0 - 8.0   Glucose, UA NEGATIVE NEGATIVE   Bilirubin Urine NEGATIVE NEGATIVE   Ketones, ur NEGATIVE NEGATIVE   Hgb urine dipstick NEGATIVE NEGATIVE   Protein, ur TRACE (A) NEGATIVE   Nitrite NEGATIVE NEGATIVE   Leukocytes,Ua NEGATIVE NEGATIVE  Microalbumin, urine  Result Value Ref Range   Microalb, Ur 8.3 mg/dL   RAM    POCT HgB A1C  Result Value Ref Range   Hemoglobin A1C 7.3 (A) 4.0 - 5.6 %   HbA1c POC (<> result, manual entry)     HbA1c, POC (prediabetic range)     HbA1c, POC (controlled diabetic range)        Assessment & Plan:   Problem List Items Addressed This Visit   None Visit Diagnoses     Visit  for suture removal    -  Primary   4 intact sutres removed        Follow up plan: Return if symptoms worsen or fail to improve.

## 2020-11-03 ENCOUNTER — Other Ambulatory Visit: Payer: Self-pay

## 2020-11-03 DIAGNOSIS — I10 Essential (primary) hypertension: Secondary | ICD-10-CM

## 2020-11-03 MED ORDER — AMLODIPINE BESYLATE 10 MG PO TABS: 10.0000 mg | ORAL_TABLET | Freq: Every day | ORAL | 0 refills | Status: DC

## 2020-11-12 ENCOUNTER — Ambulatory Visit: Payer: Self-pay

## 2020-11-12 DIAGNOSIS — E1159 Type 2 diabetes mellitus with other circulatory complications: Secondary | ICD-10-CM

## 2020-11-12 NOTE — Patient Instructions (Addendum)
Thank you for allowing the Chronic Care Management team to participate in your care.    Patient Care Plan: Diabetes Type 2 (Adult)     Problem Identified: Glycemic Management (Diabetes, Type 2)      Long-Range Goal: Glycemic Management Optimized   Start Date: 11/12/2020  Expected End Date: 03/12/2021  Priority: High  Note:   Objective:  Lab Results  Component Value Date   HGBA1C 7.3 (A) 09/07/2020   Lab Results  Component Value Date   CREATININE 0.83 09/07/2020   CREATININE 0.75 04/26/2020   CREATININE 0.97 03/12/2020   No results found for: EGFR  Current Barriers:  Chronic Disease Management support and educational needs related to Diabetes self management.  Case Manager Clinical Goal(s):  Over the next 120 days, patient will demonstrate improved adherence to prescribed treatment plan for diabetes self care/management as evidenced taking medications as prescribed,  monitoring and recording of CBG's and adherence to a ADA/ carb modified diet.  Interventions:  Collaboration with Delsa Grana, PA-C regarding development and update of comprehensive plan of care as evidenced by provider attestation and co-signature Inter-disciplinary care team collaboration (see longitudinal plan of care) Reviewed medications and importance of compliance. Advised to continue taking medications as prescribed and notify provider with concerns regarding prescribed regimen. Advised to update the care management team with concerns regarding prescription cost. Provided information regarding importance of consistent blood glucose monitoring. Reports currently not monitoring his blood glucose levels Encouraged to monitor and maintain a log. Reviewed s/sx of hypoglycemia and hyperglycemia along with recommended interventions. Discussed nutritional intake and importance of complying with a diabetic/modified carb diet. Advised to monitor nutrition labels and avoid concentrated sugars and highly processed foods  when possible. Discussed and offered referrals for available Diabetes education classes. Declined need for additional referrals our educational classes. Discussed importance of completing recommended DM preventive care. Advised to complete daily foot care and schedule foot exam as recommended. Unable to recall date of last eye exam. Denies changes in vision. Advised to schedule a routine exam as soon as possible.  Patient Goals/Self-Care Activities Self-administer medications as prescribed Attend all scheduled provider appointments Monitor blood glucose levels consistently and utilize recommended interventions Adhere to prescribed ADA/carb modified Schedule an eye exam Notify provider or care management team with questions and new concerns as needed   Follow Up Plan:  Will follow up next month       [name] verbalized understanding of the information discussed during the telephonic outreach. Declined need for mailed/printed instructions. A member of the care management team will follow up next month.   [name] Care Management [facility] [phone]

## 2020-11-12 NOTE — Chronic Care Management (AMB) (Signed)
Care Management    RN Visit Note  11/12/2020 Name: DAWON TROOP MRN: [id number] DOB: 1955/05/03  Subjective: Allyne Gee Garcilazo is a 65 y.o. year old male who is a primary care patient of Delsa Grana, [place]. The care management team was consulted for assistance with disease management and care coordination needs.    Engaged with patient by telephone for initial visit in response to provider referral for case management and care coordination services.   Consent to Services:   Mr. Kalmar was given information about Care Management services including:  Care Management services includes personalized support from designated clinical staff supervised by his physician, including individualized plan of care and coordination with other care providers 24/7 contact phone numbers for assistance for urgent and routine care needs. The patient may stop case management services at any time by phone call to the office staff.  Patient agreed to services and consent obtained.   Assessment: Review of patient past medical history, allergies, medications, health status, including review of consultants reports, laboratory and other test data, was performed as part of comprehensive evaluation and provision of chronic care management services.   SDOH (Social Determinants of Health) assessments and interventions performed:  SDOH Interventions    Flowsheet Row Most Recent Value  SDOH Interventions   Food Insecurity Interventions Intervention Not Indicated  Transportation Interventions Intervention Not Indicated        Care Plan  Allergies  Allergen Reactions   Shellfish Allergy Anaphylaxis   Metformin And Related Itching    Outpatient Encounter Medications as of 11/12/2020  Medication Sig   amLODipine (NORVASC) 10 MG tablet Take 1 tablet (10 mg total) by mouth daily.   atorvastatin (LIPITOR) 40 MG tablet Take 1 tablet (40 mg total) by mouth daily.   clopidogrel (PLAVIX) 75 MG tablet Take 1 tablet (75  mg total) by mouth daily.   empagliflozin (JARDIANCE) 25 MG TABS tablet Take 1 tablet (25 mg total) by mouth daily before breakfast.   pantoprazole (PROTONIX) 40 MG tablet Take 1 tablet (40 mg total) by mouth daily at 12 noon.   metFORMIN (GLUCOPHAGE-XR) 500 MG 24 hr tablet Take 500 mg by mouth daily with breakfast. (Patient not taking: Reported on 11/12/2020)   sildenafil (VIAGRA) 50 MG tablet Take 1 tablet (50 mg total) by mouth daily as needed for erectile dysfunction. Take 30 to 60 minutes prior to planned activities   No facility-administered encounter medications on file as of 11/12/2020.    Patient Active Problem List   Diagnosis Date Noted   Stroke [facility]) 04/26/2020   HTN (hypertension) 04/26/2020   Tobacco abuse 04/26/2020   Insomnia 10/30/2019   Current moderate episode of major depressive disorder without prior episode ([place]) 10/30/2019   Fatigue 10/30/2019   Mixed hyperlipidemia 08/28/2019   Polyp of colon 08/28/2019   Erectile dysfunction 08/28/2019   OSA (obstructive sleep apnea) 08/28/2019   Acute blood loss anemia 02/06/2019   GIB (gastrointestinal bleeding) 02/06/2019   GI bleed 02/06/2019   Class 1 obesity due to excess calories with serious comorbidity and body mass index (BMI) of 34.0 to 34.9 in adult 09/19/2015   Eczema 02/24/2015   Elevated cholesterol with elevated triglycerides 08/26/2014   Hypertension goal BP (blood pressure) < 140/90 08/26/2014   Plantar verruca 08/26/2014   Type 2 diabetes mellitus (Between) 08/26/2014    Conditions to be addressed/monitored: DMII Patient Care Plan: Diabetes Type 2 (Adult)     Problem Identified: Glycemic Management (Diabetes, Type 2)  Long-Range Goal: Glycemic Management Optimized   Start Date: 11/12/2020  Expected End Date: 03/12/2021  Priority: High  Note:   Objective:  Lab Results  Component Value Date   HGBA1C 7.3 (A) 09/07/2020   Lab Results  Component Value Date   CREATININE 0.83 09/07/2020    CREATININE 0.75 04/26/2020   CREATININE 0.97 03/12/2020   No results found for: EGFR  Current Barriers:  Chronic Disease Management support and educational needs related to Diabetes self management.  Case Manager Clinical Goal(s):  Over the next 120 days, patient will demonstrate improved adherence to prescribed treatment plan for diabetes self care/management as evidenced taking medications as prescribed,  monitoring and recording of CBG's and adherence to a ADA/ carb modified diet.  Interventions:  Collaboration with Delsa Grana, PA-C regarding development and update of comprehensive plan of care as evidenced by provider attestation and co-signature Inter-disciplinary care team collaboration (see longitudinal plan of care) Reviewed medications and importance of compliance. Advised to continue taking medications as prescribed and notify provider with concerns regarding prescribed regimen. Advised to update the care management team with concerns regarding prescription cost. Provided information regarding importance of consistent blood glucose monitoring. Reports currently not monitoring his blood glucose levels Encouraged to monitor and maintain a log. Reviewed s/sx of hypoglycemia and hyperglycemia along with recommended interventions. Discussed nutritional intake and importance of complying with a diabetic/modified carb diet. Advised to monitor nutrition labels and avoid concentrated sugars and highly processed foods when possible. Discussed and offered referrals for available Diabetes education classes. Declined need for additional referrals our educational classes. Discussed importance of completing recommended DM preventive care. Advised to complete daily foot care and schedule foot exam as recommended. Unable to recall date of last eye exam. Denies changes in vision. Advised to schedule a routine exam as soon as possible.  Patient Goals/Self-Care Activities Self-administer medications as  prescribed Attend all scheduled provider appointments Monitor blood glucose levels consistently and utilize recommended interventions Adhere to prescribed ADA/carb modified Schedule an eye exam Notify provider or care management team with questions and new concerns as needed   Follow Up Plan:  Will follow up next month      PLAN A member of the care management team will follow up with Mr. Salls next month.   [name] Care Management [facility] [phone]

## 2020-12-03 DIAGNOSIS — R361 Hematospermia: Secondary | ICD-10-CM | POA: Diagnosis not present

## 2020-12-03 DIAGNOSIS — R310 Gross hematuria: Secondary | ICD-10-CM | POA: Diagnosis not present

## 2020-12-03 DIAGNOSIS — Z125 Encounter for screening for malignant neoplasm of prostate: Secondary | ICD-10-CM | POA: Diagnosis not present

## 2020-12-10 ENCOUNTER — Ambulatory Visit: Payer: BC Managed Care – PPO | Admitting: Family Medicine

## 2020-12-10 DIAGNOSIS — E1159 Type 2 diabetes mellitus with other circulatory complications: Secondary | ICD-10-CM

## 2020-12-10 DIAGNOSIS — I10 Essential (primary) hypertension: Secondary | ICD-10-CM

## 2020-12-10 DIAGNOSIS — Z5181 Encounter for therapeutic drug level monitoring: Secondary | ICD-10-CM

## 2020-12-10 DIAGNOSIS — E782 Mixed hyperlipidemia: Secondary | ICD-10-CM

## 2020-12-10 DIAGNOSIS — E6609 Other obesity due to excess calories: Secondary | ICD-10-CM

## 2020-12-13 ENCOUNTER — Encounter: Payer: Self-pay | Admitting: Family Medicine

## 2020-12-13 ENCOUNTER — Other Ambulatory Visit: Payer: Self-pay

## 2020-12-13 ENCOUNTER — Ambulatory Visit: Payer: BC Managed Care – PPO | Admitting: Family Medicine

## 2020-12-13 VITALS — BP 122/78 | HR 98 | Temp 98.2°F | Resp 16 | Ht 69.0 in | Wt 231.7 lb

## 2020-12-13 DIAGNOSIS — R9089 Other abnormal findings on diagnostic imaging of central nervous system: Secondary | ICD-10-CM

## 2020-12-13 DIAGNOSIS — E782 Mixed hyperlipidemia: Secondary | ICD-10-CM | POA: Diagnosis not present

## 2020-12-13 DIAGNOSIS — E538 Deficiency of other specified B group vitamins: Secondary | ICD-10-CM

## 2020-12-13 DIAGNOSIS — Z7902 Long term (current) use of antithrombotics/antiplatelets: Secondary | ICD-10-CM

## 2020-12-13 DIAGNOSIS — E66811 Obesity, class 1: Secondary | ICD-10-CM

## 2020-12-13 DIAGNOSIS — I10 Essential (primary) hypertension: Secondary | ICD-10-CM

## 2020-12-13 DIAGNOSIS — R7989 Other specified abnormal findings of blood chemistry: Secondary | ICD-10-CM | POA: Diagnosis not present

## 2020-12-13 DIAGNOSIS — Z6834 Body mass index (BMI) 34.0-34.9, adult: Secondary | ICD-10-CM

## 2020-12-13 DIAGNOSIS — E1165 Type 2 diabetes mellitus with hyperglycemia: Secondary | ICD-10-CM

## 2020-12-13 DIAGNOSIS — E6609 Other obesity due to excess calories: Secondary | ICD-10-CM | POA: Diagnosis not present

## 2020-12-13 DIAGNOSIS — L602 Onychogryphosis: Secondary | ICD-10-CM

## 2020-12-13 DIAGNOSIS — Z8673 Personal history of transient ischemic attack (TIA), and cerebral infarction without residual deficits: Secondary | ICD-10-CM

## 2020-12-13 DIAGNOSIS — I5189 Other ill-defined heart diseases: Secondary | ICD-10-CM

## 2020-12-13 DIAGNOSIS — Z23 Encounter for immunization: Secondary | ICD-10-CM

## 2020-12-13 DIAGNOSIS — E1159 Type 2 diabetes mellitus with other circulatory complications: Secondary | ICD-10-CM

## 2020-12-13 DIAGNOSIS — I77810 Thoracic aortic ectasia: Secondary | ICD-10-CM

## 2020-12-13 DIAGNOSIS — Z8719 Personal history of other diseases of the digestive system: Secondary | ICD-10-CM

## 2020-12-13 MED ORDER — EMPAGLIFLOZIN 25 MG PO TABS: 25.0000 mg | ORAL_TABLET | Freq: Every day | ORAL | 3 refills | Status: DC

## 2020-12-13 MED ORDER — ATORVASTATIN CALCIUM 40 MG PO TABS: 40.0000 mg | ORAL_TABLET | Freq: Every day | ORAL | 2 refills | Status: DC

## 2020-12-13 MED ORDER — AMLODIPINE BESYLATE 10 MG PO TABS: 10.0000 mg | ORAL_TABLET | Freq: Every day | ORAL | 3 refills | Status: DC

## 2020-12-13 MED ORDER — PANTOPRAZOLE SODIUM 40 MG PO TBEC: 40.0000 mg | DELAYED_RELEASE_TABLET | Freq: Every day | ORAL | 3 refills | Status: DC

## 2020-12-13 NOTE — Patient Instructions (Signed)
I will let you know about starting aspirin or continuing plavix   Also will let you know about your blood sugars and B12 levels, which were both abnormal last office visit  We will need a follow up MRI - you should get a call about that  Let me know if you need a referral to a foot doctor to help with any nail care of blisters on feet   Diabetes Mellitus and Standards of [place] with and managing diabetes (diabetes mellitus) can be complicated. Your diabetes treatment may be managed by a team of health care providers, including: A physician who specializes in diabetes (endocrinologist). You might also have visits with a nurse practitioner or physician assistant. Nurses. A registered dietitian. A certified diabetes care and education specialist. An exercise specialist. A pharmacist. An eye doctor. A foot specialist (podiatrist). A dental care provider. A primary care provider. A mental health care provider. How to manage your diabetes You can do many things to successfully manage your diabetes. Your health care providers will follow guidelines to help you get the best quality of care. Here are general guidelines for your diabetes management plan. Your health care providers may give you more specific instructions. Physical exams When you are diagnosed with diabetes, and each year after that, your health care provider will ask about your medical and family history. You will have a physical exam, which may include: Measuring your height, weight, and body mass index (BMI). Checking your blood pressure. This will be done at every routine medical visit. Your target blood pressure may vary depending on your medical conditions, your age, and other factors. A thyroid exam. A skin exam. Screening for nerve damage (peripheral neuropathy). This may include checking the pulse in your legs and feet and the level of sensation in your hands and feet. A foot exam to inspect the structure and  skin of your feet, including checking for cuts, bruises, redness, blisters, sores, or other problems. Screening for blood vessel (vascular) problems. This may include checking the pulse in your legs and feet and checking your temperature. Blood tests Depending on your treatment plan and your personal needs, you may have the following tests: Hemoglobin A1C (HbA1C). This test provides information about blood sugar (glucose) control over the previous 2-3 months. It is used to adjust your treatment plan, if needed. This test will be done: At least 2 times a year, if you are meeting your treatment goals. 4 times a year, if you are not meeting your treatment goals or if your goals have changed. Lipid testing, including total cholesterol, LDL and HDL cholesterol, and triglyceride levels. The goal for LDL is less than 100 mg/dL (5.5 mmol/L). If you are at high risk for complications, the goal is less than 70 mg/dL (3.9 mmol/L). The goal for HDL is 40 mg/dL (2.2 mmol/L) or higher for men, and 50 mg/dL (2.8 mmol/L) or higher for women. An HDL cholesterol of 60 mg/dL (3.3 mmol/L) or higher gives some protection against heart disease. The goal for triglycerides is less than 150 mg/dL (8.3 mmol/L). Liver function tests. Kidney function tests. Thyroid function tests.  Dental and eye exams  Visit your dentist two times a year. If you have type 1 diabetes, your health care provider may recommend an eye exam within 5 years after you are diagnosed, and then once a year after your first exam. For children with type 1 diabetes, the health care provider may recommend an eye exam when your child is  age 23 or older and has had diabetes for 3-5 years. After the first exam, your child should get an eye exam once a year. If you have type 2 diabetes, your health care provider may recommend an eye exam as soon as you are diagnosed, and then every 1-2 years after your first exam. Immunizations A yearly flu (influenza)  vaccine is recommended annually for everyone 6 months or older. This is especially important if you have diabetes. The pneumonia (pneumococcal) vaccine is recommended for everyone 2 years or older who has diabetes. If you are age 77 or older, you may get the pneumonia vaccine as a series of two separate shots. The hepatitis B vaccine is recommended for adults shortly after being diagnosed with diabetes. Adults and children with diabetes should receive all other vaccines according to age-specific recommendations from the Centers for Disease Control and Prevention (CDC). Mental and emotional health Screening for symptoms of eating disorders, anxiety, and depression is recommended at the time of diagnosis and after as needed. If your screening shows that you have symptoms, you may need more evaluation. You may work with a mental health care provider. Follow these instructions at home: Treatment plan You will monitor your blood glucose levels and may give yourself insulin. Your treatment plan will be reviewed at every medical visit. You and your health care provider will discuss: How you are taking your medicines, including insulin. Any side effects you have. Your blood glucose level target goals. How often you monitor your blood glucose level. Lifestyle habits, such as activity level and tobacco, alcohol, and substance use. Education Your health care provider will assess how well you are monitoring your blood glucose levels and whether you are taking your insulin and medicines correctly. He or she may refer you to: A certified diabetes care and education specialist to manage your diabetes throughout your life, starting at diagnosis. A registered dietitian who can create and review your personal nutrition plan. An exercise specialist who can discuss your activity level and exercise plan. General instructions Take over-the-counter and prescription medicines only as told by your health care  provider. Keep all follow-up visits. This is important. Where to find support There are many diabetes support networks, including: American Diabetes Association (ADA): diabetes.org Defeat Diabetes Foundation: defeatdiabetes.org Where to find more information American Diabetes Association (ADA): www.diabetes.org Association of Diabetes Care & Education Specialists (ADCES): diabeteseducator.org International Diabetes Federation (IDF): https://www.munoz-bell.org/ Summary Managing diabetes (diabetes mellitus) can be complicated. Your diabetes treatment may be managed by a team of health care providers. Your health care providers follow guidelines to help you get the best quality care. You should have physical exams, blood tests, blood pressure monitoring, immunizations, and screening tests regularly. Stay updated on how to manage your diabetes. Your health care providers may also give you more specific instructions based on your individual health. This information is not intended to replace advice given to you by your health care provider. Make sure you discuss any questions you have with your health care provider. Document Revised: 09/18/2019 Document Reviewed: 09/18/2019 Elsevier Patient Education  [place].
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
## 2020-12-14 LAB — COMPLETE METABOLIC PANEL WITH GFR
AG Ratio: 1.5 (calc) (ref 1.0–2.5)
ALT: 38 U/L (ref 9–46)
AST: 37 U/L — ABNORMAL HIGH (ref 10–35)
Albumin: 4.5 g/dL (ref 3.6–5.1)
Alkaline phosphatase (APISO): 59 U/L (ref 35–144)
BUN: 9 mg/dL (ref 7–25)
CO2: 28 mmol/L (ref 20–32)
Calcium: 10 mg/dL (ref 8.6–10.3)
Chloride: 104 mmol/L (ref 98–110)
Creat: 0.95 mg/dL (ref 0.70–1.35)
Globulin: 3.1 g/dL (calc) (ref 1.9–3.7)
Glucose, Bld: 122 mg/dL — ABNORMAL HIGH (ref 65–99)
Potassium: 3.7 mmol/L (ref 3.5–5.3)
Sodium: 141 mmol/L (ref 135–146)
Total Bilirubin: 0.8 mg/dL (ref 0.2–1.2)
Total Protein: 7.6 g/dL (ref 6.1–8.1)
eGFR: 88 mL/min/{1.73_m2} (ref >=60)

## 2020-12-14 LAB — CBC WITH DIFFERENTIAL/PLATELET
Absolute Monocytes: 1135 cells/uL — ABNORMAL HIGH (ref 200–950)
Basophils Absolute: 53 cells/uL (ref 0–200)
Basophils Relative: 0.6 %
Eosinophils Absolute: 194 cells/uL (ref 15–500)
Eosinophils Relative: 2.2 %
HCT: 45.4 % (ref 38.5–50.0)
Hemoglobin: 15.2 g/dL (ref 13.2–17.1)
Lymphs Abs: 1795 cells/uL (ref 850–3900)
MCH: 31 pg (ref 27.0–33.0)
MCHC: 33.5 g/dL (ref 32.0–36.0)
MCV: 92.5 fL (ref 80.0–100.0)
MPV: 10.5 fL (ref 7.5–12.5)
Monocytes Relative: 12.9 %
Neutro Abs: 5623 cells/uL (ref 1500–7800)
Neutrophils Relative %: 63.9 %
Platelets: 244 10*3/uL (ref 140–400)
RBC: 4.91 10*6/uL (ref 4.20–5.80)
RDW: 12.6 % (ref 11.0–15.0)
Total Lymphocyte: 20.4 %
WBC: 8.8 10*3/uL (ref 3.8–10.8)

## 2020-12-14 LAB — HEMOGLOBIN A1C
Hgb A1c MFr Bld: 6.7 % of total Hgb — ABNORMAL HIGH (ref <5.7)
Mean Plasma Glucose: 146 mg/dL
eAG (mmol/L): 8.1 mmol/L

## 2020-12-14 LAB — VITAMIN B12: Vitamin B-12: 324 pg/mL (ref 200–1100)

## 2020-12-16 ENCOUNTER — Telehealth: Payer: Self-pay

## 2020-12-16 NOTE — Telephone Encounter (Signed)
  Care Management   Follow Up Note   12/16/2020 Name: DELVONTE BERENSON MRN: [id number] DOB: 09/14/1955   Primary Care Provider: Delsa Grana, PA-C Reason for referral : Chronic Care Management   An unsuccessful telephone outreach was attempted today. The patient was referred to the case management team for assistance with care management and care coordination.    Follow Up Plan:  A HIPAA compliant voice message was left today requesting a return call.   [name] [facility] Care Management [facility] [phone]

## 2020-12-23 ENCOUNTER — Other Ambulatory Visit: Payer: Self-pay | Admitting: Family Medicine

## 2020-12-23 MED ORDER — CLOPIDOGREL BISULFATE 75 MG PO TABS: 75.0000 mg | ORAL_TABLET | Freq: Every day | ORAL | 0 refills | Status: DC

## 2020-12-24 ENCOUNTER — Telehealth: Payer: Self-pay

## 2020-12-24 NOTE — Telephone Encounter (Signed)
  Care Management   Follow Up Note   12/24/2020 Name: Paul Ingram MRN: [id number] DOB: 14-Aug-1955   Primary Care Provider: Delsa Grana, PA-C Reason for referral : Chronic Care Management   An unsuccessful telephone outreach was attempted today. The patient was referred to the case management team for assistance with care management and care coordination.    Follow Up Plan:  A HIPAA compliant voice message was left today requesting a return call.   [name] [facility] Care Management [facility] [phone]

## 2020-12-24 NOTE — Telephone Encounter (Signed)
Attempted to contact patient, left voicemail for return call.

## 2020-12-24 NOTE — Telephone Encounter (Signed)
Copied from [place] [id number]. Topic: General - Other >> Dec 23, 2020  3:44 PM Pawlus, Brayton Layman A wrote: Reason for CRM: Pt called to go over latest lab results, please advise.

## 2020-12-29 ENCOUNTER — Encounter: Payer: Self-pay | Admitting: Family Medicine

## 2020-12-29 DIAGNOSIS — H524 Presbyopia: Secondary | ICD-10-CM | POA: Diagnosis not present

## 2020-12-30 LAB — HM DIABETES EYE EXAM

## 2020-12-31 ENCOUNTER — Encounter: Payer: Self-pay | Admitting: Family Medicine

## 2020-12-31 ENCOUNTER — Ambulatory Visit: Payer: Self-pay

## 2020-12-31 DIAGNOSIS — E1165 Type 2 diabetes mellitus with hyperglycemia: Secondary | ICD-10-CM

## 2020-12-31 NOTE — Chronic Care Management (AMB) (Signed)
Care Management    RN Visit Note  12/31/2020 Name: Paul Ingram MRN: [id number] DOB: 1955/10/09  Subjective: Paul Ingram is a 65 y.o. year old male who is a primary care patient of Delsa Grana, [place]. The care management team was consulted for assistance with disease management and care coordination needs.    Engaged with patient by telephone for follow up visit in response to provider referral for case management and care coordination services.   Consent to Services:   Paul Ingram was given information about Care Management services today including:  Care Management services includes personalized support from designated clinical staff supervised by his physician, including individualized plan of care and coordination with other care providers 24/7 contact phone numbers for assistance for urgent and routine care needs. The patient may stop case management services at any time by phone call to the office staff.  Patient agreed to services and consent obtained.   Assessment: Review of patient past medical history, allergies, medications, health status, including review of consultants reports, laboratory and other test data, was performed as part of comprehensive evaluation and provision of chronic care management services.   SDOH (Social Determinants of Health) assessments and interventions performed:    Care Plan  Allergies  Allergen Reactions   Shellfish Allergy Anaphylaxis   Metformin And Related Itching    Outpatient Encounter Medications as of 12/31/2020  Medication Sig   amLODipine (NORVASC) 10 MG tablet Take 1 tablet (10 mg total) by mouth daily.   atorvastatin (LIPITOR) 40 MG tablet Take 1 tablet (40 mg total) by mouth at bedtime.   clopidogrel (PLAVIX) 75 MG tablet Take 1 tablet (75 mg total) by mouth daily.   empagliflozin (JARDIANCE) 25 MG TABS tablet Take 1 tablet (25 mg total) by mouth daily before breakfast.   pantoprazole (PROTONIX) 40 MG tablet Take 1 tablet (40  mg total) by mouth daily at 12 noon.   No facility-administered encounter medications on file as of 12/31/2020.    Patient Active Problem List   Diagnosis Date Noted   Dilated aortic root ([place]) 12/13/2020   Elevated LFTs 12/13/2020   Abnormal brain MRI 12/13/2020   B12 deficiency 12/13/2020   Stroke ([place]) 04/26/2020   HTN (hypertension) 04/26/2020   Tobacco abuse 04/26/2020   Insomnia 10/30/2019   Current moderate episode of major depressive disorder without prior episode ([place]) 10/30/2019   Fatigue 10/30/2019   Mixed hyperlipidemia 08/28/2019   Polyp of colon 08/28/2019   Erectile dysfunction 08/28/2019   OSA (obstructive sleep apnea) 08/28/2019   Acute blood loss anemia 02/06/2019   GIB (gastrointestinal bleeding) 02/06/2019   GI bleed 02/06/2019   Class 1 obesity due to excess calories with serious comorbidity and body mass index (BMI) of 34.0 to 34.9 in adult 09/19/2015   Eczema 02/24/2015   Elevated cholesterol with elevated triglycerides 08/26/2014   Hypertension goal BP (blood pressure) < 140/90 08/26/2014   Plantar verruca 08/26/2014   Type 2 diabetes mellitus ([place]) 08/26/2014    Conditions to be addressed/monitored: DMII and Assistance with obtaining medication refills. Patient Care Plan: Diabetes Type 2 (Adult)     Problem Identified: Glycemic Management (Diabetes, Type 2)      Long-Range Goal: Glycemic Management Optimized   Start Date: 11/12/2020  Expected End Date: 03/12/2021  Priority: High  Note:   Objective:  Lab Results  Component Value Date   HGBA1C 7.3 (A) 09/07/2020   Lab Results  Component Value Date   CREATININE 0.83 09/07/2020  CREATININE 0.75 04/26/2020   CREATININE 0.97 03/12/2020   No results found for: EGFR  Current Barriers:  Chronic Disease Management support and educational needs related to Diabetes self management.  Case Manager Clinical Goal(s):  Over the next 120 days, patient will demonstrate improved adherence to prescribed  treatment plan for diabetes self care/management as evidenced taking medications as prescribed,  monitoring and recording of CBG's and adherence to a ADA/ carb modified diet.  Interventions:  Collaboration with Delsa Grana, PA-C regarding development and update of comprehensive plan of care as evidenced by provider attestation and co-signature Inter-disciplinary care team collaboration (see longitudinal plan of care) Reviewed medications. Reports taking medications as prescribed. Denies concerns regarding prescription cost. Provided information regarding importance of consistent blood glucose monitoring. Encouraged to monitor and maintain a log. Reviewed s/sx of hypoglycemia and hyperglycemia along with recommended interventions. Requested assistance with obtaining refills. Contacted [facility]. Per technician, medications will be refilled and available later today.   Patient Goals/Self-Care Activities Self-administer medications as prescribed Attend all scheduled provider appointments Monitor blood glucose levels consistently and utilize recommended interventions Adhere to prescribed ADA/carb modified Schedule an eye exam Notify provider or care management team with questions and new concerns as needed   Follow Up Plan:  Will follow up next month      PLAN  A member of the care management team will follow up within the next month.   [name] [facility] Care Management [facility] [phone]

## 2020-12-31 NOTE — Patient Instructions (Addendum)
Thank you for allowing the Chronic Care Management team to participate in your care.    Patient Care Plan: Diabetes Type 2 (Adult)     Problem Identified: Glycemic Management (Diabetes, Type 2)      Long-Range Goal: Glycemic Management Optimized   Start Date: 11/12/2020  Expected End Date: 03/12/2021  Priority: High  Note:   Objective:  Lab Results  Component Value Date   HGBA1C 7.3 (A) 09/07/2020   Lab Results  Component Value Date   CREATININE 0.83 09/07/2020   CREATININE 0.75 04/26/2020   CREATININE 0.97 03/12/2020   No results found for: EGFR  Current Barriers:  Chronic Disease Management support and educational needs related to Diabetes self management.  Case Manager Clinical Goal(s):  Over the next 120 days, patient will demonstrate improved adherence to prescribed treatment plan for diabetes self care/management as evidenced taking medications as prescribed,  monitoring and recording of CBG's and adherence to a ADA/ carb modified diet.  Interventions:  Collaboration with Delsa Grana, PA-C regarding development and update of comprehensive plan of care as evidenced by provider attestation and co-signature Inter-disciplinary care team collaboration (see longitudinal plan of care) Reviewed medications. Reports taking medications as prescribed. Denies concerns regarding prescription cost. Provided information regarding importance of consistent blood glucose monitoring. Encouraged to monitor and maintain a log. Reviewed s/sx of hypoglycemia and hyperglycemia along with recommended interventions. Requested assistance with obtaining refills. Contacted [facility]. Per technician, medications will be refilled and available later today.   Patient Goals/Self-Care Activities Self-administer medications as prescribed Attend all scheduled provider appointments Monitor blood glucose levels consistently and utilize recommended interventions Adhere to prescribed ADA/carb  modified Schedule an eye exam Notify provider or care management team with questions and new concerns as needed   Follow Up Plan:  Will follow up next month      Paul Ingram verbalized understanding of the information discussed during the telephonic outreach. Declined need for mailed/printed instructions. A member of the care management team will follow up within the next month.   Paul Ingram [facility] Care Management [facility] [phone]

## 2021-01-21 ENCOUNTER — Ambulatory Visit: Payer: Self-pay

## 2021-01-21 DIAGNOSIS — E1165 Type 2 diabetes mellitus with hyperglycemia: Secondary | ICD-10-CM

## 2021-01-21 DIAGNOSIS — E1159 Type 2 diabetes mellitus with other circulatory complications: Secondary | ICD-10-CM

## 2021-01-21 NOTE — Patient Instructions (Addendum)
Thank you for allowing the Chronic Care Management team to participate in your care.    Patient Care Plan: Diabetes Type 2 (Adult)     Problem Identified: Glycemic Management (Diabetes, Type 2)      Long-Range Goal: Glycemic Management Optimized   Start Date: 11/12/2020  Expected End Date: 03/12/2021  Priority: High  Note:    Lab Results  Component Value Date   HGBA1C 6.7 (H) 12/13/2020    Current Barriers:  Chronic Disease Management support and educational needs related to Diabetes self management.  Case Manager Clinical Goal(s):  Over the next 120 days, patient will demonstrate improved adherence to prescribed treatment plan for diabetes self care/management as evidenced taking medications as prescribed,  monitoring and recording of CBG's and adherence to a ADA/ carb modified diet.  Interventions:  Collaboration with Delsa Grana, PA-C regarding development and update of comprehensive plan of care as evidenced by provider attestation and co-signature Inter-disciplinary care team collaboration (see longitudinal plan of care) Reviewed compliance with treatment plan. Reports excellent compliance with medications. Reports not monitoring his blood glucose levels. Reviewed s/sx of hypoglycemia and hyperglycemia. Denies symptoms. Reports attempting to adhere to recommended diet. Declined need for additional diabetic resources. His A1C has improved. Decreased from 7.3% to 6.1%. Discussed importance of blood glucose monitoring. Encouraged to monitor and maintain a log.   Patient Goals/Self-Care Activities Self-administer medications as prescribed Attend all scheduled provider appointments Monitor blood glucose levels and utilize recommended interventions Adhere to prescribed ADA/carb modified Schedule an eye exam Notify provider or care management team with questions and new concerns as needed         Paul Ingram verbalized understanding of the information discussed during the  telephonic outreach. Declined need for mailed/printed instructions. A member of the care management team will follow up in three months.   Paul Ingram [facility] Care Management [facility] [phone]

## 2021-01-21 NOTE — Chronic Care Management (AMB) (Signed)
Chronic Care Management   CCM RN Visit Note  01/21/2021 Name: Paul Ingram MRN: [id number] DOB: [birth year]  Subjective: Paul Ingram is a 65 y.o. year old male who is a primary care patient of Paul Ingram, [place]. The care management team was consulted for assistance with disease management and care coordination needs.    Engaged with patient by telephone for follow up visit in response to provider referral for case management and care coordination services.   Consent to Services:  The patient was given information about Chronic Care Management services, agreed to services, and gave verbal consent prior to initiation of services.  Please see initial visit note for detailed documentation.    Assessment: Review of patient past medical history, allergies, medications, health status, including review of consultants reports, laboratory and other test data, was performed as part of comprehensive evaluation and provision of chronic care management services.   SDOH (Social Determinants of Health) assessments and interventions performed: No  CCM Care Plan  Allergies  Allergen Reactions   Shellfish Allergy Anaphylaxis   Metformin And Related Itching    Outpatient Encounter Medications as of 01/21/2021  Medication Sig   amLODipine (NORVASC) 10 MG tablet Take 1 tablet (10 mg total) by mouth daily.   atorvastatin (LIPITOR) 40 MG tablet Take 1 tablet (40 mg total) by mouth at bedtime.   clopidogrel (PLAVIX) 75 MG tablet Take 1 tablet (75 mg total) by mouth daily.   empagliflozin (JARDIANCE) 25 MG TABS tablet Take 1 tablet (25 mg total) by mouth daily before breakfast.   pantoprazole (PROTONIX) 40 MG tablet Take 1 tablet (40 mg total) by mouth daily at 12 noon.   No facility-administered encounter medications on file as of 01/21/2021.    Patient Active Problem List   Diagnosis Date Noted   Dilated aortic root ([place]) 12/13/2020   Elevated LFTs 12/13/2020   Abnormal brain MRI 12/13/2020    B12 deficiency 12/13/2020   Stroke ([place]) 04/26/2020   HTN (hypertension) 04/26/2020   Tobacco abuse 04/26/2020   Insomnia 10/30/2019   Current moderate episode of major depressive disorder without prior episode ([place]) 10/30/2019   Fatigue 10/30/2019   Mixed hyperlipidemia 08/28/2019   Polyp of colon 08/28/2019   Erectile dysfunction 08/28/2019   OSA (obstructive sleep apnea) 08/28/2019   Acute blood loss anemia 02/06/2019   GIB (gastrointestinal bleeding) 02/06/2019   GI bleed 02/06/2019   Class 1 obesity due to excess calories with serious comorbidity and body mass index (BMI) of 34.0 to 34.9 in adult 09/19/2015   Eczema 02/24/2015   Elevated cholesterol with elevated triglycerides 08/26/2014   Hypertension goal BP (blood pressure) < 140/90 08/26/2014   Plantar verruca 08/26/2014   Type 2 diabetes mellitus ([place]) 08/26/2014    Conditions to be addressed/monitored:DMII Patient Care Plan: Diabetes Type 2 (Adult)     Problem Identified: Glycemic Management (Diabetes, Type 2)      Long-Range Goal: Glycemic Management Optimized   Start Date: 11/12/2020  Expected End Date: 03/12/2021  Priority: High  Note:    Lab Results  Component Value Date   HGBA1C 6.7 (H) 12/13/2020    Current Barriers:  Chronic Disease Management support and educational needs related to Diabetes self management.  Case Manager Clinical Goal(s):  Over the next 120 days, patient will demonstrate improved adherence to prescribed treatment plan for diabetes self care/management as evidenced taking medications as prescribed,  monitoring and recording of CBG's and adherence to a ADA/ carb modified diet.  Interventions:  Collaboration with Paul Grana, PA-C regarding development and update of comprehensive plan of care as evidenced by provider attestation and co-signature Inter-disciplinary care team collaboration (see longitudinal plan of care) Reviewed compliance with treatment plan. Reports excellent  compliance with medications. Reports not monitoring his blood glucose levels. Reviewed s/sx of hypoglycemia and hyperglycemia. Denies symptoms. Reports attempting to adhere to recommended diet. Declined need for additional diabetic resources. His A1C has improved. Decreased from 7.3% to 6.1%. Discussed importance of blood glucose monitoring. Encouraged to monitor and maintain a log.   Patient Goals/Self-Care Activities Self-administer medications as prescribed Attend all scheduled provider appointments Monitor blood glucose levels and utilize recommended interventions Adhere to prescribed ADA/carb modified Schedule an eye exam Notify provider or care management team with questions and new concerns as needed        PLAN A member of the care management team will follow up in three months.   Paul Ingram [facility] Care Management [facility] [phone]

## 2021-02-21 DIAGNOSIS — N200 Calculus of kidney: Secondary | ICD-10-CM | POA: Diagnosis not present

## 2021-02-21 DIAGNOSIS — K760 Fatty (change of) liver, not elsewhere classified: Secondary | ICD-10-CM | POA: Diagnosis not present

## 2021-02-21 DIAGNOSIS — R310 Gross hematuria: Secondary | ICD-10-CM | POA: Diagnosis not present

## 2021-02-21 DIAGNOSIS — N329 Bladder disorder, unspecified: Secondary | ICD-10-CM | POA: Diagnosis not present

## 2021-03-04 ENCOUNTER — Ambulatory Visit: Payer: Self-pay

## 2021-03-04 DIAGNOSIS — R319 Hematuria, unspecified: Secondary | ICD-10-CM

## 2021-03-04 DIAGNOSIS — E1159 Type 2 diabetes mellitus with other circulatory complications: Secondary | ICD-10-CM

## 2021-03-04 NOTE — Patient Instructions (Signed)
Thank you for allowing the Chronic Care Management team to participate in your care.  

## 2021-03-04 NOTE — Chronic Care Management (AMB) (Addendum)
Care Management    RN Visit Note  03/04/2021 Name: Paul Ingram MRN: [id number] DOB: 1955/11/03  Subjective: Paul Ingram is a 65 y.o. year old male who is a primary care patient of Delsa Grana, [place]. The care management team was consulted for assistance with disease management and care coordination needs.    Engaged with patient by telephone for follow up visit in response to provider referral for case management and/or care coordination services.   Consent to Services:   [name] was given information about Care Management services today including:  Care Management services includes personalized support from designated clinical staff supervised by his physician, including individualized plan of care and coordination with other care providers 24/7 contact phone numbers for assistance for urgent and routine care needs. The patient may stop case management services at any time by phone call to the office staff.  Patient agreed to services and consent obtained.   Assessment: Review of patient past medical history, allergies, medications, health status, including review of consultants reports, laboratory and other test data, was performed as part of comprehensive evaluation and provision of chronic care management services.   SDOH (Social Determinants of Health) assessments and interventions performed:  No  Care Plan  Allergies  Allergen Reactions   Shellfish Allergy Anaphylaxis   Metformin And Related Itching    Outpatient Encounter Medications as of 03/04/2021  Medication Sig   amLODipine (NORVASC) 10 MG tablet Take 1 tablet (10 mg total) by mouth daily.   atorvastatin (LIPITOR) 40 MG tablet Take 1 tablet (40 mg total) by mouth at bedtime.   clopidogrel (PLAVIX) 75 MG tablet Take 1 tablet (75 mg total) by mouth daily.   empagliflozin (JARDIANCE) 25 MG TABS tablet Take 1 tablet (25 mg total) by mouth daily before breakfast.   pantoprazole (PROTONIX) 40 MG tablet Take 1 tablet  (40 mg total) by mouth daily at 12 noon.   No facility-administered encounter medications on file as of 03/04/2021.    Patient Active Problem List   Diagnosis Date Noted   Dilated aortic root ([place]) 12/13/2020   Elevated LFTs 12/13/2020   Abnormal brain MRI 12/13/2020   B12 deficiency 12/13/2020   Stroke ([place]) 04/26/2020   HTN (hypertension) 04/26/2020   Tobacco abuse 04/26/2020   Insomnia 10/30/2019   Current moderate episode of major depressive disorder without prior episode ([place]) 10/30/2019   Fatigue 10/30/2019   Mixed hyperlipidemia 08/28/2019   Polyp of colon 08/28/2019   Erectile dysfunction 08/28/2019   OSA (obstructive sleep apnea) 08/28/2019   Acute blood loss anemia 02/06/2019   GIB (gastrointestinal bleeding) 02/06/2019   GI bleed 02/06/2019   Class 1 obesity due to excess calories with serious comorbidity and body mass index (BMI) of 34.0 to 34.9 in adult 09/19/2015   Eczema 02/24/2015   Elevated cholesterol with elevated triglycerides 08/26/2014   Hypertension goal BP (blood pressure) < 140/90 08/26/2014   Plantar verruca 08/26/2014   Type 2 diabetes mellitus ([place]) 08/26/2014     Patient Care Plan: Diabetes Type 2 (Adult)     Problem Identified: Glycemic Management (Diabetes, Type 2)      Long-Range Goal: Glycemic Management Optimized Completed 03/04/2021  Start Date: 11/12/2020  Expected End Date: 03/12/2021  Priority: High  Note:    Current Barriers:  Care Management support and educational needs related to Diabetes self management.  Case Manager Clinical Goal(s):  Over the next 120 days, patient will demonstrate improved adherence to prescribed treatment plan for diabetes  self care/management as evidenced taking medications as prescribed,  monitoring and recording of CBG's and adherence to a ADA/ carb modified diet.  Interventions:  Collaboration with Delsa Grana, PA-C regarding development and update of comprehensive plan of care as evidenced by  provider attestation and co-signature Inter-disciplinary care team collaboration (see longitudinal plan of care) Reviewed compliance with treatment plan. Reports excellent compliance with medications. Denies concerns regarding medication management or prescription cost. Reviewed s/sx of hypoglycemia and hyperglycemia. Reports not monitoring his levels at home. Denies symptoms.  Discussed available diabetic resources. Declined need for additional resources. Reports managing well. His A1C is currently at goal.  Advised to continue completing foot care. Advised to complete annual eye exams as recommended.    Patient Goals/Self-Care Activities Self-administer medications as prescribed Attend all scheduled provider appointments Monitor blood glucose levels and utilize recommended interventions Adhere to prescribed ADA/carb modified Notify provider or care management team with questions and new concerns as needed          PLAN [name] declines need for additional outreach. Agreed to contact the clinic if he requires additional assistance.   [name] [facility] Care Management [facility] [phone]

## 2021-04-14 DIAGNOSIS — M8588 Other specified disorders of bone density and structure, other site: Secondary | ICD-10-CM | POA: Diagnosis not present

## 2021-04-14 DIAGNOSIS — M4316 Spondylolisthesis, lumbar region: Secondary | ICD-10-CM | POA: Diagnosis not present

## 2021-04-14 DIAGNOSIS — S39012A Strain of muscle, fascia and tendon of lower back, initial encounter: Secondary | ICD-10-CM | POA: Diagnosis not present

## 2021-04-14 DIAGNOSIS — M47816 Spondylosis without myelopathy or radiculopathy, lumbar region: Secondary | ICD-10-CM | POA: Diagnosis not present

## 2021-04-14 DIAGNOSIS — M545 Low back pain, unspecified: Secondary | ICD-10-CM | POA: Diagnosis not present

## 2021-04-15 ENCOUNTER — Ambulatory Visit (INDEPENDENT_AMBULATORY_CARE_PROVIDER_SITE_OTHER): Payer: BC Managed Care – PPO | Admitting: Family Medicine

## 2021-04-15 ENCOUNTER — Encounter: Payer: Self-pay | Admitting: Family Medicine

## 2021-04-15 ENCOUNTER — Ambulatory Visit: Payer: BC Managed Care – PPO | Admitting: Family Medicine

## 2021-04-15 ENCOUNTER — Other Ambulatory Visit: Payer: Self-pay

## 2021-04-15 VITALS — BP 138/90 | HR 86 | Resp 16 | Ht 69.0 in | Wt 235.0 lb

## 2021-04-15 DIAGNOSIS — G4733 Obstructive sleep apnea (adult) (pediatric): Secondary | ICD-10-CM

## 2021-04-15 DIAGNOSIS — E785 Hyperlipidemia, unspecified: Secondary | ICD-10-CM

## 2021-04-15 DIAGNOSIS — E538 Deficiency of other specified B group vitamins: Secondary | ICD-10-CM | POA: Diagnosis not present

## 2021-04-15 DIAGNOSIS — Z8673 Personal history of transient ischemic attack (TIA), and cerebral infarction without residual deficits: Secondary | ICD-10-CM

## 2021-04-15 DIAGNOSIS — F1020 Alcohol dependence, uncomplicated: Secondary | ICD-10-CM

## 2021-04-15 DIAGNOSIS — E1169 Type 2 diabetes mellitus with other specified complication: Secondary | ICD-10-CM

## 2021-04-15 DIAGNOSIS — I77810 Thoracic aortic ectasia: Secondary | ICD-10-CM | POA: Diagnosis not present

## 2021-04-15 DIAGNOSIS — Z8719 Personal history of other diseases of the digestive system: Secondary | ICD-10-CM

## 2021-04-15 LAB — POCT GLYCOSYLATED HEMOGLOBIN (HGB A1C): Hemoglobin A1C: 6.7 % — AB (ref 4.0–5.6)

## 2021-04-15 MED ORDER — VITAMIN B-12 1000 MCG SL SUBL: 1.0000 | SUBLINGUAL_TABLET | Freq: Every day | SUBLINGUAL | 1 refills | Status: DC

## 2021-04-15 MED ORDER — CLOPIDOGREL BISULFATE 75 MG PO TABS: 75.0000 mg | ORAL_TABLET | Freq: Every day | ORAL | 1 refills | Status: DC
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
## 2021-05-04 ENCOUNTER — Encounter: Payer: Self-pay | Admitting: Family Medicine

## 2021-05-11 ENCOUNTER — Encounter: Payer: Self-pay | Admitting: Internal Medicine

## 2021-05-11 ENCOUNTER — Ambulatory Visit (INDEPENDENT_AMBULATORY_CARE_PROVIDER_SITE_OTHER): Payer: BC Managed Care – PPO | Admitting: Internal Medicine

## 2021-05-11 ENCOUNTER — Ambulatory Visit: Payer: Self-pay

## 2021-05-11 VITALS — BP 130/82 | HR 100 | Temp 97.6°F | Resp 16 | Ht 69.0 in | Wt 233.3 lb

## 2021-05-11 DIAGNOSIS — E782 Mixed hyperlipidemia: Secondary | ICD-10-CM | POA: Diagnosis not present

## 2021-05-11 DIAGNOSIS — I10 Essential (primary) hypertension: Secondary | ICD-10-CM

## 2021-05-11 DIAGNOSIS — Z8673 Personal history of transient ischemic attack (TIA), and cerebral infarction without residual deficits: Secondary | ICD-10-CM

## 2021-05-11 NOTE — Assessment & Plan Note (Signed)
Stable, continue statin 
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
## 2021-05-11 NOTE — Assessment & Plan Note (Signed)
Still having left sided deficits but pulses good in LLE with good sensation. Dry skin. Continue to work to control blood pressure, cholesterol. On a statin and Plavix.

## 2021-05-11 NOTE — Telephone Encounter (Signed)
° °  Chief Complaint: Elevated BP 168/108 Symptoms: Dizzy Frequency: Monday Pertinent Negatives: Patient denies any other symptoms Disposition: [] ED /[] Urgent Care (no appt availability in office) / [x] Appointment(In office/virtual)/ []  [place] [facility]/ [] Home Care/ [] Refused Recommended Disposition /[] [place] [facility]/ []  Follow-up with PCP Additional Notes:    Reason for Disposition  Systolic BP  >= 031 OR Diastolic >= 594  Answer Assessment - Initial Assessment Questions 1. BLOOD PRESSURE: "What is the blood pressure?" "Did you take at least two measurements 5 minutes apart?"     168/108   157/107 2. ONSET: "When did you take your blood pressure?"     0700 3. HOW: "How did you obtain the blood pressure?" (e.g., visiting nurse, automatic home BP monitor)     Nurse 4. HISTORY: "Do you have a history of high blood pressure?"     Yes 5. MEDICATIONS: "Are you taking any medications for blood pressure?" "Have you missed any doses recently?"     No 6. OTHER SYMPTOMS: "Do you have any symptoms?" (e.g., headache, chest pain, blurred vision, difficulty breathing, weakness)     Dizziness 7. PREGNANCY: "Is there any chance you are pregnant?" "When was your last menstrual period?"     N/a  Protocols used: Blood Pressure - High-A-AH

## 2021-05-11 NOTE — Assessment & Plan Note (Signed)
BP at goal here, will have him follow up for a nurse's visit in a few days to compare against his home cuff, possibly getting inaccurate readings. Continue Amlodipine 10 and checking BP at home.

## 2021-05-11 NOTE — Patient Instructions (Signed)
It was great seeing you today!  Plan discussed at today's visit: -Continue to check blood pressure at home and record -Continue Amlodipine 10  -Please return in 1-2 weeks for a nurse's visit to check blood pressure with your cuff, so please bring your cuff  Follow up in: 1-2 weeks  Take care and let us know if you have any questions or concerns prior to your next visit.  Dr. Rosana Berger

## 2021-05-16 ENCOUNTER — Ambulatory Visit: Payer: BC Managed Care – PPO

## 2021-05-16 VITALS — BP 122/72 | Ht 69.0 in | Wt 233.0 lb

## 2021-05-16 DIAGNOSIS — I10 Essential (primary) hypertension: Secondary | ICD-10-CM

## 2021-07-10 IMAGING — MR MR HEAD W/O CM
14 series · 46 of 48 positions shown · non-contrast
Comparison: Noncontrast head CT performed earlier today 04/26/2020.

CLINICAL DATA: Neuro deficit, acute, stroke suspected. Additional
history provided: Patient with history of hypertension reporting
left face, arm and leg numbness and tingling.

EXAM:
MRI HEAD WITHOUT CONTRAST
TECHNIQUE: Multiplanar, multiecho pulse sequences of the brain and surrounding
structures were obtained without intravenous contrast.

[Series 5: ax dwi_tracew · axial · 3.0mm · 0.60mm/px · z∈[-80,+74]mm · 2 of 48 slices shown]
[im 1/48]
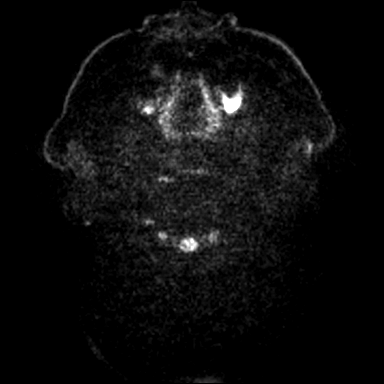
[im 48/48]
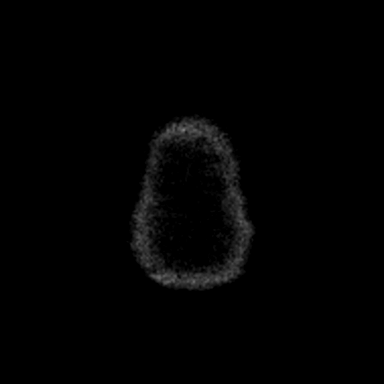

[Series 6: ax dwi_adc · axial · 3.0mm · 0.60mm/px · z∈[-80,+74]mm · 2 of 48 slices shown]
[im 1/48]
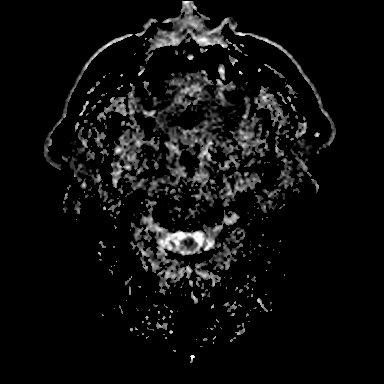
[im 48/48]
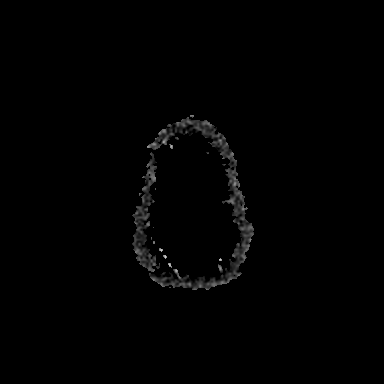

[Series 7: cor dwi_tracew · coronal · 5.0mm · 0.60mm/px · 3 of 42 slices shown]
[im 1/42]
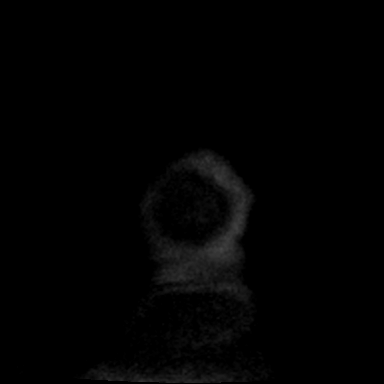
[im 21/42]
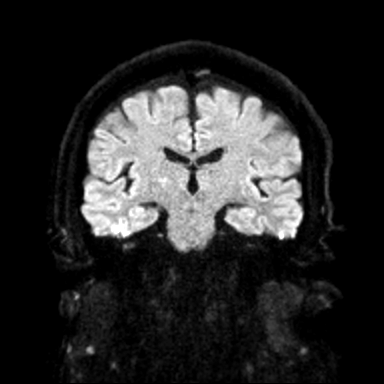
[im 42/42]
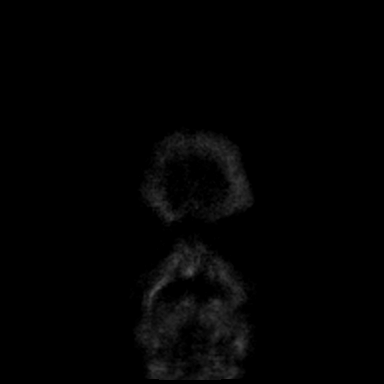

[Series 8: cor dwi_adc · coronal · 5.0mm · 0.60mm/px · 3 of 42 slices shown]
[im 1/42]
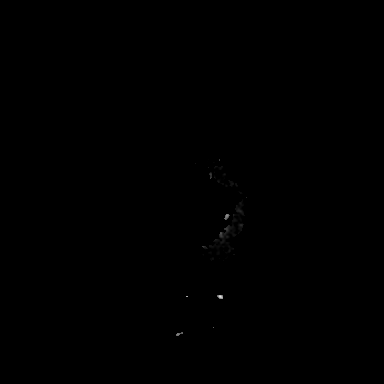
[im 21/42]
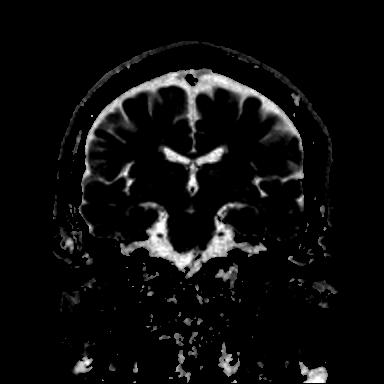
[im 42/42]
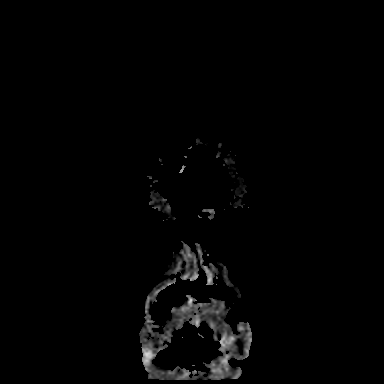

[Series 9: T1 · sagittal · 5.0mm · 0.62mm/px · 2 of 25 slices shown (1 of 3)]
[im 1/25]
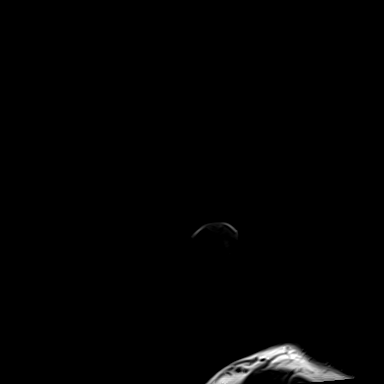
[im 25/25]
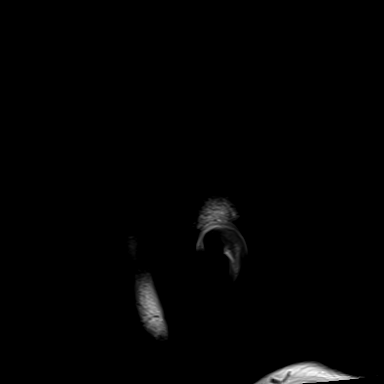

[Series 10: T2 · axial · 5.0mm · 0.53mm/px · z∈[-81,+74]mm · 2 of 27 slices shown (1 of 2)]
[im 1/27]
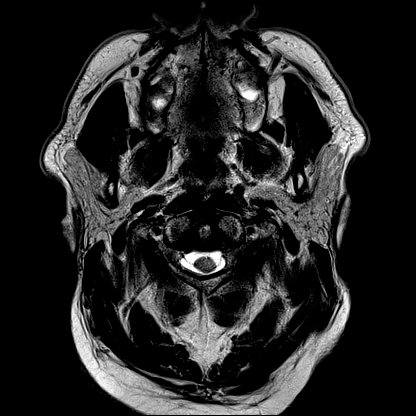
[im 27/27]
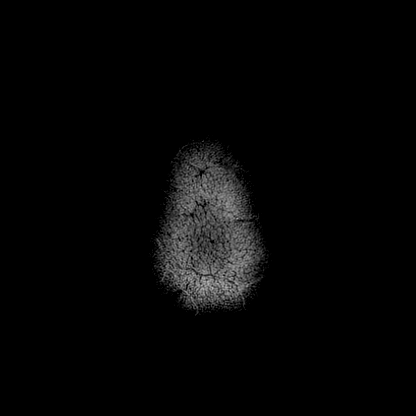

[Series 11: mag_images · axial · 3.0mm · 0.90mm/px · z∈[-91,+85]mm · 4 of 60 slices shown]
[im 1/60]
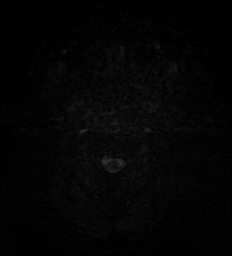
[im 20/60]
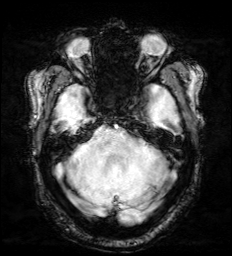
[im 40/60]
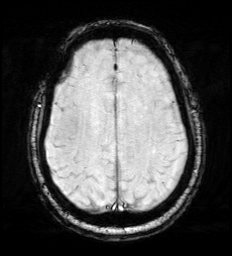
[im 60/60]
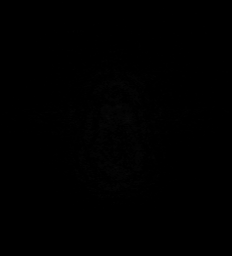

[Series 12: pha_images · axial · 3.0mm · 0.90mm/px · z∈[-82,+85]mm · 4 of 57 slices shown]
[im 1/57]
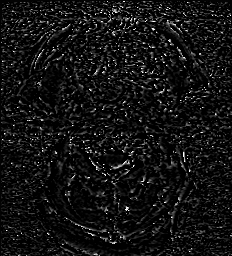
[im 19/57]
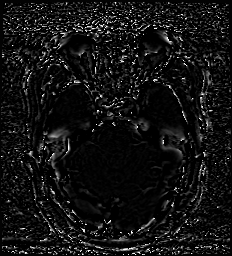
[im 38/57]
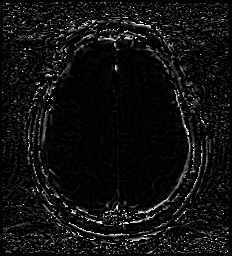
[im 57/57]
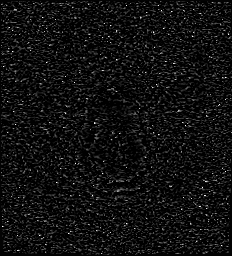

[Series 13: swi_images · axial · 3.0mm · 0.90mm/px · z∈[-91,+85]mm · 4 of 60 slices shown]
[im 1/60]
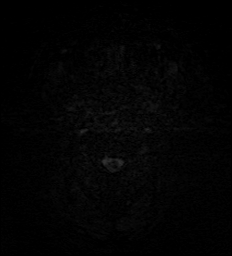
[im 20/60]
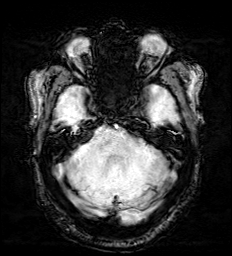
[im 40/60]
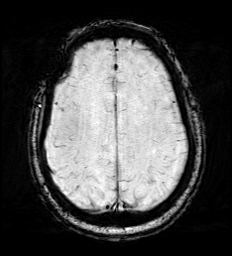
[im 60/60]
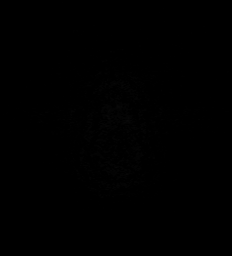

[Series 14: mip_images(sw) · axial · 24.0mm · 0.90mm/px · z∈[-80,+74]mm · 3 of 53 slices shown]
[im 1/53]
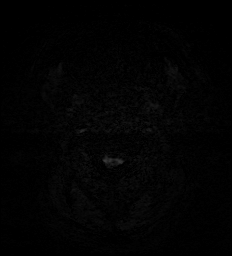
[im 27/53]
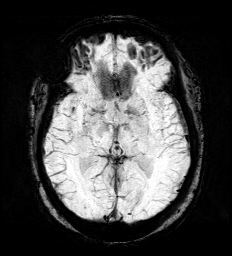
[im 53/53]
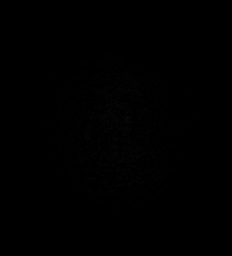

[Series 15: FLAIR · axial · 3.0mm · 0.53mm/px · z∈[-84,+77]mm · 4 of 55 slices shown]
[im 1/55]
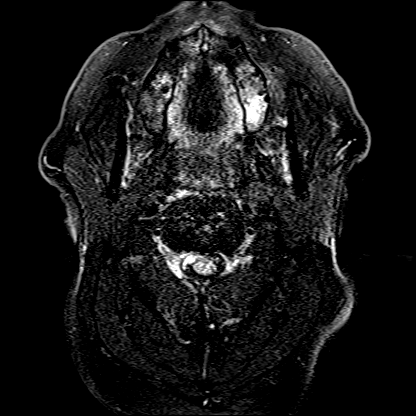
[im 19/55]
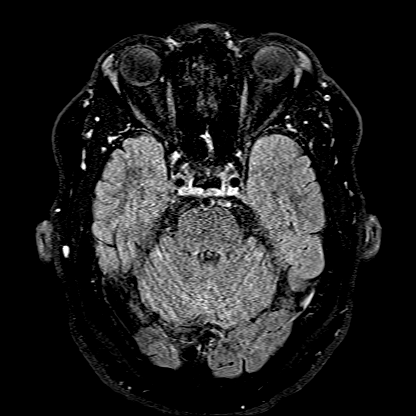
[im 37/55]
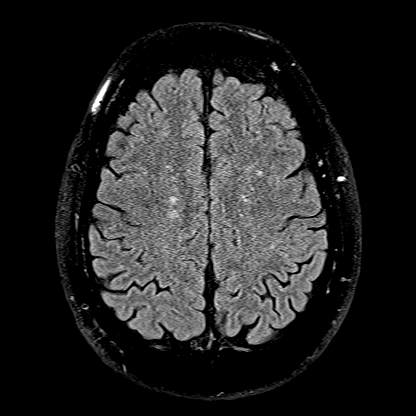
[im 55/55]
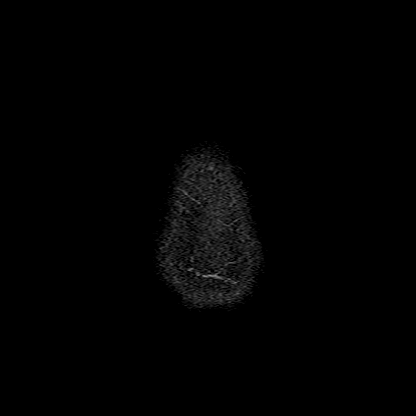

[Series 16: T1 · axial · 1.0mm · 0.98mm/px · z∈[-93,+80]mm · 9 of 176 slices shown (2 of 3)]
[im 1/176]
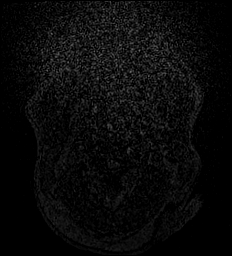
[im 18/176]
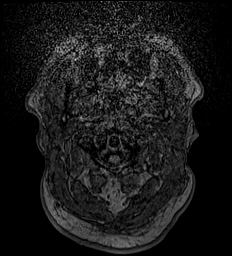
[im 36/176]
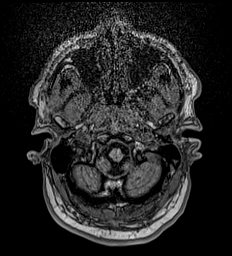
[im 53/176]
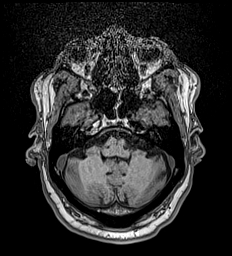
[im 71/176]
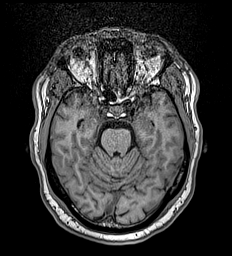
[im 106/176]
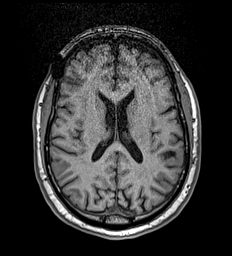
[im 123/176]
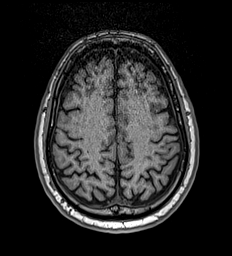
[im 141/176]
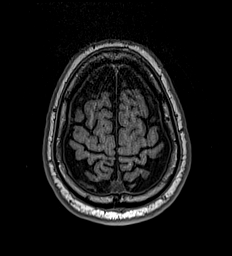
[im 176/176]
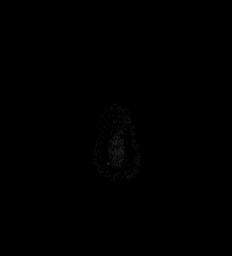

[Series 17: T2 · coronal · 5.0mm · 0.57mm/px · 2 of 30 slices shown (2 of 2)]
[im 1/30]
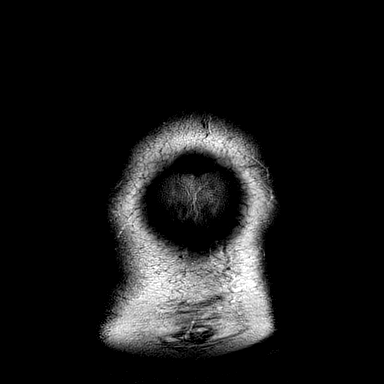
[im 30/30]
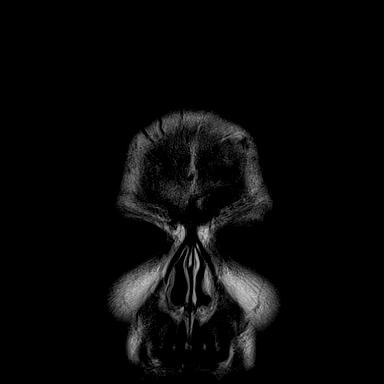

[Series 18: T1 · axial · 5.0mm · 0.90mm/px · z∈[-86,+69]mm · 2 of 27 slices shown (3 of 3)]
[im 1/27]
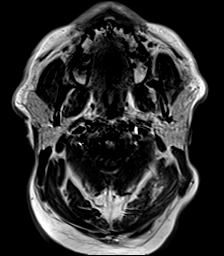
[im 27/27]
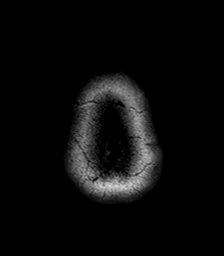

[46 of 48 positions shown; findings below may reference images not displayed]

FINDINGS: Brain:

Mild intermittent motion degradation.

Mild cerebral and cerebellar atrophy.

7 mm focus of restricted diffusion within the left thalamus
consistent with acute infarction (series 5, image 25) (series 7,
image 20).

Mild multifocal T2/FLAIR hyperintensity within the cerebral white
matter is nonspecific, but compatible with chronic small vessel
ischemic disease.

8 mm rounded T2 hyperintense lesion within the high right
postcentral gyrus with subtle surrounding gliosis (series 15, image
46) (series 10, image 23).

No chronic intracranial blood products.

No extra-axial fluid collection.

No midline shift.

Vascular: Expected proximal arterial flow voids.

Skull and upper cervical spine: No focal marrow lesion.

Sinuses/Orbits: Visualized orbits show no acute finding. Trace
paranasal sinus mucosal thickening, most notably left frontal.
IMPRESSION: 7 mm acute infarct within the right thalamus.

8 mm rounded T2 hyperintense lesion within the high right
postcentral gyrus with subtle surrounding gliosis. This may reflect
a focus of chronic encephalomalacia. However, given the atypical
rounded appearance, alternative etiologies are difficult to exclude
(i.e. low-grade neoplasm). A nonemergent contrast-enhanced brain MRI
is recommended for further characterization. Additionally, 4-6 month
MRI follow-up is recommended to ensure stability.

Mild generalized atrophy of the brain and chronic small vessel
ischemic disease.

Mild paranasal sinus mucosal thickening.

## 2021-07-21 ENCOUNTER — Telehealth: Payer: Self-pay | Admitting: Family Medicine

## 2021-07-21 NOTE — Telephone Encounter (Signed)
Sarah, pharmacist with [facility] is calling in for assistance. She would like to speak with assistant about pt's medications. Wanting to know if pt is still currently taking  ? ? ?clopidogrel (PLAVIX) 75 MG tablet  ?atorvastatin (LIPITOR) 40 MG tablet  ?Metformin ? ? ? ?[phone] phone  ?

## 2021-07-21 NOTE — Telephone Encounter (Signed)
Called patient to verify prescriptions taking. He states he only has 3 medicine bottles he takes. He confirmed he is taking Plavix, Jardiance, and Amlodipine. States he does not have any prescriptions in his cabinet/bag for Metformin or Lipitor. Called Sarah back and left a voicemail confirming medicines. ?

## 2021-08-11 DIAGNOSIS — M545 Low back pain, unspecified: Secondary | ICD-10-CM | POA: Diagnosis not present

## 2021-08-11 DIAGNOSIS — R8271 Bacteriuria: Secondary | ICD-10-CM | POA: Diagnosis not present

## 2021-08-11 DIAGNOSIS — R350 Frequency of micturition: Secondary | ICD-10-CM | POA: Diagnosis not present

## 2021-08-12 ENCOUNTER — Encounter: Payer: Self-pay | Admitting: Family Medicine

## 2021-08-12 ENCOUNTER — Ambulatory Visit: Payer: BC Managed Care – PPO | Admitting: Family Medicine

## 2021-08-12 VITALS — BP 138/72 | HR 96 | Temp 98.5°F | Resp 18 | Ht 68.0 in | Wt 232.0 lb

## 2021-08-12 DIAGNOSIS — E782 Mixed hyperlipidemia: Secondary | ICD-10-CM

## 2021-08-12 DIAGNOSIS — F1020 Alcohol dependence, uncomplicated: Secondary | ICD-10-CM | POA: Insufficient documentation

## 2021-08-12 DIAGNOSIS — E538 Deficiency of other specified B group vitamins: Secondary | ICD-10-CM

## 2021-08-12 DIAGNOSIS — R399 Unspecified symptoms and signs involving the genitourinary system: Secondary | ICD-10-CM

## 2021-08-12 DIAGNOSIS — Z23 Encounter for immunization: Secondary | ICD-10-CM | POA: Diagnosis not present

## 2021-08-12 DIAGNOSIS — I10 Essential (primary) hypertension: Secondary | ICD-10-CM

## 2021-08-12 DIAGNOSIS — I77810 Thoracic aortic ectasia: Secondary | ICD-10-CM

## 2021-08-12 DIAGNOSIS — Z8673 Personal history of transient ischemic attack (TIA), and cerebral infarction without residual deficits: Secondary | ICD-10-CM

## 2021-08-12 DIAGNOSIS — E1169 Type 2 diabetes mellitus with other specified complication: Secondary | ICD-10-CM | POA: Diagnosis not present

## 2021-08-12 DIAGNOSIS — Z8719 Personal history of other diseases of the digestive system: Secondary | ICD-10-CM | POA: Insufficient documentation

## 2021-08-12 DIAGNOSIS — M545 Low back pain, unspecified: Secondary | ICD-10-CM | POA: Insufficient documentation

## 2021-08-12 DIAGNOSIS — E785 Hyperlipidemia, unspecified: Secondary | ICD-10-CM | POA: Diagnosis not present

## 2021-08-12 DIAGNOSIS — G4733 Obstructive sleep apnea (adult) (pediatric): Secondary | ICD-10-CM

## 2021-08-12 LAB — POCT GLYCOSYLATED HEMOGLOBIN (HGB A1C): Hemoglobin A1C: 7.1 % — AB (ref 4.0–5.6)

## 2021-08-12 NOTE — Assessment & Plan Note (Signed)
Recheck Echo in 2024

## 2021-08-12 NOTE — Assessment & Plan Note (Signed)
Doing well, cutting down , no longer drinking daily but needs to decrease intake even more

## 2021-08-12 NOTE — Assessment & Plan Note (Signed)
Continue supplementation, recheck labs during CPE

## 2021-08-12 NOTE — Assessment & Plan Note (Signed)
We will try again to send orders for supplies

## 2021-08-12 NOTE — Assessment & Plan Note (Signed)
Continue plavix and statin therapy

## 2021-08-12 NOTE — Assessment & Plan Note (Signed)
A1C is not great and trending up but he does not want to add any other medications today
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
## 2021-08-12 NOTE — Patient Instructions (Signed)

## 2021-08-12 NOTE — Assessment & Plan Note (Signed)
Discussed PT but he prefers trying home exercises at this time He has missed work due to recurrent back pain

## 2021-08-16 ENCOUNTER — Other Ambulatory Visit: Payer: Self-pay

## 2021-08-16 ENCOUNTER — Emergency Department: Payer: BC Managed Care – PPO

## 2021-08-16 ENCOUNTER — Emergency Department
Admission: EM | Admit: 2021-08-16 | Discharge: 2021-08-16 | Disposition: A | Discharge status: Home / Self Care | Payer: BC Managed Care – PPO | Attending: Emergency Medicine | Admitting: Emergency Medicine

## 2021-08-16 DIAGNOSIS — S39012A Strain of muscle, fascia and tendon of lower back, initial encounter: Secondary | ICD-10-CM | POA: Diagnosis not present

## 2021-08-16 DIAGNOSIS — E119 Type 2 diabetes mellitus without complications: Secondary | ICD-10-CM | POA: Diagnosis not present

## 2021-08-16 DIAGNOSIS — I10 Essential (primary) hypertension: Secondary | ICD-10-CM | POA: Diagnosis not present

## 2021-08-16 DIAGNOSIS — R079 Chest pain, unspecified: Secondary | ICD-10-CM | POA: Diagnosis not present

## 2021-08-16 DIAGNOSIS — X500XXA Overexertion from strenuous movement or load, initial encounter: Secondary | ICD-10-CM | POA: Insufficient documentation

## 2021-08-16 DIAGNOSIS — S3992XA Unspecified injury of lower back, initial encounter: Secondary | ICD-10-CM | POA: Diagnosis not present

## 2021-08-16 HISTORY — DX: Type 2 diabetes mellitus without complications: E11.9

## 2021-08-16 LAB — BASIC METABOLIC PANEL
Anion gap: 11 (ref 5–15)
BUN: 14 mg/dL (ref 8–23)
CO2: 25 mmol/L (ref 22–32)
Calcium: 9.5 mg/dL (ref 8.9–10.3)
Chloride: 102 mmol/L (ref 98–111)
Creatinine, Ser: 0.8 mg/dL (ref 0.61–1.24)
GFR, Estimated: >60 mL/min (ref >60)
Glucose, Bld: 138 mg/dL — ABNORMAL HIGH (ref 70–99)
Potassium: 3.6 mmol/L (ref 3.5–5.1)
Sodium: 138 mmol/L (ref 135–145)

## 2021-08-16 LAB — TROPONIN I (HIGH SENSITIVITY): Troponin I (High Sensitivity): 12 ng/L (ref <18)

## 2021-08-16 LAB — CBC
HCT: 46.7 % (ref 39.0–52.0)
Hemoglobin: 15.9 g/dL (ref 13.0–17.0)
MCH: 31.4 pg (ref 26.0–34.0)
MCHC: 34 g/dL (ref 30.0–36.0)
MCV: 92.3 fL (ref 80.0–100.0)
Platelets: 218 10*3/uL (ref 150–400)
RBC: 5.06 MIL/uL (ref 4.22–5.81)
RDW: 13 % (ref 11.5–15.5)
WBC: 7.8 10*3/uL (ref 4.0–10.5)
nRBC: 0 % (ref 0.0–0.2)

## 2021-08-16 MED ORDER — LIDOCAINE 5 % EX PTCH
1.0000 | MEDICATED_PATCH | CUTANEOUS | Status: DC
  Administered 2021-08-16: 1 via TRANSDERMAL
  Filled 2021-08-16: qty 1

## 2021-08-16 MED ORDER — CYCLOBENZAPRINE HCL 5 MG PO TABS: 5.0000 mg | ORAL_TABLET | Freq: Three times a day (TID) | ORAL | 0 refills | Status: DC | PRN

## 2021-08-16 MED ORDER — KETOROLAC TROMETHAMINE 30 MG/ML IJ SOLN
30.0000 mg | Freq: Once | INTRAMUSCULAR | Status: AC
  Administered 2021-08-16: 30 mg via INTRAMUSCULAR
  Filled 2021-08-16: qty 1

## 2021-08-16 MED ORDER — LIDOCAINE 5 % EX PTCH: 1.0000 | MEDICATED_PATCH | Freq: Two times a day (BID) | CUTANEOUS | 0 refills | Status: DC

## 2021-08-16 NOTE — ED Notes (Signed)
87 yom with a c/c of lower back pain since last Wednesday. The pt denies any injury to his back. The pt also has a complaint of right sided wrist pain since this morning.

## 2021-08-16 NOTE — ED Triage Notes (Signed)
Reports right lower back pain. Pain has moved to left shoulder blade intermittently. No known injury. Has been seen for lower back pain over the last week, but has not had any relief.  Pt alert and oriented X4, cooperative, RR even and unlabored, color WNL. Pt in NAD.

## 2021-08-16 NOTE — ED Provider Notes (Signed)
[facility] Provider Note    Event Date/Time   First MD Initiated Contact with Patient 08/16/21 0715     (approximate)   History   Chief Complaint Back Pain   HPI  Paul Ingram is a 66 y.o. male with past medical history of hypertension, hyperlipidemia, diabetes, and stroke who presents to the ED complaining of back pain.  Patient reports he has had about 5 days of constant pain in the right lower portion of his back.  He describes it as a dull ache that is worse with certain positions and when bending.  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  He reports intermittent pain behind his left shoulder blade but none currently, pain does not radiate down his leg or anywhere else.  He does not have any numbness or weakness in his groin or lower extremities, has been walking without difficulty.  He denies any fevers, urinary retention, incontinence, abdominal pain, or chest pain.  He reports issues with his back in the past, but it typically goes away after a couple of days.  He was prescribed prednisone, baclofen, and cefdinir at the walk-in clinic due to concern for UTI.  He denies any flank pain or dysuria.     Physical Exam   Triage Vital Signs: ED Triage Vitals  Enc Vitals Group     BP 08/16/21 0652 123/80     Pulse Rate 08/16/21 0652 85     Resp 08/16/21 0652 16     Temp 08/16/21 0652 98.7 F (37.1 C)     Temp Source 08/16/21 0652 Oral     SpO2 08/16/21 0652 96 %     Weight 08/16/21 0653 232 lb 2.3 oz (105.3 kg)     Height 08/16/21 0653 '5\' 8"'$  (1.727 m)     Head Circumference --      Peak Flow --      Pain Score 08/16/21 0652 10     Pain Loc --      Pain Edu? --      Excl. in [place]? --     Most recent vital signs: Vitals:   08/16/21 0652  BP: 123/80  Pulse: 85  Resp: 16  Temp: 98.7 F (37.1 C)  SpO2: 96%    Constitutional: Alert and oriented. Eyes: Conjunctivae are  normal. Head: Atraumatic. Nose: No congestion/rhinnorhea. Mouth/Throat: Mucous membranes are moist.  Cardiovascular: Normal rate, regular rhythm. Grossly normal heart sounds.  2+ radial pulses bilaterally. Respiratory: Normal respiratory effort.  No retractions. Lungs CTAB. Gastrointestinal: Soft and nontender.  No CVA tenderness bilaterally.  No distention. Musculoskeletal: No lower extremity tenderness nor edema.  No midline thoracic or lumbar spinal tenderness to palpation.  Tenderness to palpation noted over right lumbar paraspinal area. Neurologic:  Normal speech and language. No gross focal neurologic deficits are appreciated.    ED Results / Procedures / Treatments   Labs (all labs ordered are listed, but only abnormal results are displayed) Labs Reviewed  BASIC METABOLIC PANEL - Abnormal; Notable for the following components:      Result Value   Glucose, Bld 138 (*)    All other components within normal limits  CBC  TROPONIN I (HIGH SENSITIVITY)     EKG  ED ECG REPORT I, Blake Divine, the attending physician, personally viewed and interpreted this ECG.   Date: 08/16/2021  EKG Time: 6:55  Rate: 86  Rhythm: normal sinus rhythm  Axis: LAD  Intervals:none  ST&T Change: Nonspecific T wave changes  RADIOLOGY Chest x-ray reviewed and interpreted by me with no infiltrate, edema, or effusion.  PROCEDURES:  Critical Care performed: No  Procedures   MEDICATIONS ORDERED IN ED: Medications  lidocaine (LIDODERM) 5 % 1 patch (1 patch Transdermal Patch Applied 08/16/21 0755)  ketorolac (TORADOL) 30 MG/ML injection 30 mg (30 mg Intramuscular Given 08/16/21 0755)     IMPRESSION / MDM / ASSESSMENT AND PLAN / ED COURSE  I reviewed the triage vital signs and the nursing notes.                              66 y.o. male with past medical history of hypertension, hyperlipidemia, diabetes, and stroke who presents to the ED complaining of 5 days of constant pain in his  right lower back worse with certain positions.  Differential diagnosis includes, but is not limited to, lumbar strain, cauda equina, pyelonephritis, dissection, ACS, pneumonia.  Patient well-appearing and in no acute distress, vital signs are unremarkable and EKG shows no evidence of arrhythmia or ischemia.  Patient has reproducible tenderness in the right lumbar portion of his back, denies any current pain behind his shoulder blade and does not have any pain in his chest.  Pain is exacerbated by certain positional changes and seems consistent with lumbar strain.  No radicular symptoms noted and no findings to suggest cauda equina.  Troponin is negative, CBC shows no anemia or leukocytosis, BMP shows no electrolyte abnormality or AKI.  No reason to suspect cardiac etiology or dissection at this time, we will treat symptomatically with IM Toradol as well as Lidoderm patch.  Reviewing his recent walk-in clinic visit, patient's urine culture showed no growth and he denies any urinary symptoms, doubt pyelonephritis or kidney stone.  He is appropriate for discharge home with PCP follow-up, was counseled to return to the ED for new worsening symptoms.  Patient agrees with plan.      FINAL CLINICAL IMPRESSION(S) / ED DIAGNOSES   Final diagnoses:  Strain of lumbar region, initial encounter     Rx / DC Orders   ED Discharge Orders          Ordered    lidocaine (LIDODERM) 5 %  Every 12 hours        08/16/21 0749    cyclobenzaprine (FLEXERIL) 5 MG tablet  3 times daily PRN        08/16/21 0749             Note:  This document was prepared using Dragon voice recognition software and may include unintentional dictation errors.   Blake Divine, MD 08/16/21 [phone]

## 2021-09-09 ENCOUNTER — Telehealth: Payer: Self-pay

## 2021-09-09 ENCOUNTER — Other Ambulatory Visit: Payer: Self-pay

## 2021-09-09 DIAGNOSIS — E782 Mixed hyperlipidemia: Secondary | ICD-10-CM

## 2021-09-09 MED ORDER — ATORVASTATIN CALCIUM 40 MG PO TABS: 40.0000 mg | ORAL_TABLET | Freq: Every day | ORAL | 2 refills | Status: DC

## 2021-09-09 NOTE — Telephone Encounter (Signed)
Sent!

## 2021-09-09 NOTE — Telephone Encounter (Signed)
Copied from [place] [id number]. Topic: General - Other >> Sep 09, 2021  2:53 PM Faith T wrote: Paul Ingram- [phone] a nurse from [facility] called in requesting if PCP could give her a call back, she states she spoke with pt about his medications, and stated that he only takes 3 medications PROTONIX, JARDIANCE, and AMLODIPINE, but there are a few medications on his list, and she wanted to confirm that, and asked for a call back, and you can also leave VM. Please advise.

## 2021-10-05 NOTE — Patient Instructions (Addendum)
Preventive Care 68 Years and Older, Male Preventive care refers to lifestyle choices and visits with your health care provider that can promote health and wellness. Preventive care visits are also called wellness exams. What can I expect for my preventive care visit? Counseling During your preventive care visit, your health care provider may ask about your: Medical history, including: Past medical problems. Family medical history. History of falls. Current health, including: Emotional well-being. Home life and relationship well-being. Sexual activity. Memory and ability to understand (cognition). Lifestyle, including: Alcohol, nicotine or tobacco, and drug use. Access to firearms. Diet, exercise, and sleep habits. Work and work environment. Sunscreen use. Safety issues such as seatbelt and bike helmet use. Physical exam Your health care provider will check your: Height and weight. These may be used to calculate your BMI (body mass index). BMI is a measurement that tells if you are at a healthy weight. Waist circumference. This measures the distance around your waistline. This measurement also tells if you are at a healthy weight and may help predict your risk of certain diseases, such as type 2 diabetes and high blood pressure. Heart rate and blood pressure. Body temperature. Skin for abnormal spots. What immunizations do I need?  Vaccines are usually given at various ages, according to a schedule. Your health care provider will recommend vaccines for you based on your age, medical history, and lifestyle or other factors, such as travel or where you work. What tests do I need? Screening Your health care provider may recommend screening tests for certain conditions. This may include: Lipid and cholesterol levels. Diabetes screening. This is done by checking your blood sugar (glucose) after you have not eaten for a while (fasting). Hepatitis C test. Hepatitis B test. HIV (human  immunodeficiency virus) test. STI (sexually transmitted infection) testing, if you are at risk. Lung cancer screening. Colorectal cancer screening. Prostate cancer screening. Abdominal aortic aneurysm (AAA) screening. You may need this if you are a current or former smoker. Talk with your health care provider about your test results, treatment options, and if necessary, the need for more tests. Follow these instructions at home: Eating and drinking  Eat a diet that includes fresh fruits and vegetables, whole grains, lean protein, and low-fat dairy products. Limit your intake of foods with high amounts of sugar, saturated fats, and salt. Take vitamin and mineral supplements as recommended by your health care provider. Do not drink alcohol if your health care provider tells you not to drink. If you drink alcohol: Limit how much you have to 0-2 drinks a day. Know how much alcohol is in your drink. In the [place] one drink equals one 12 oz bottle of beer (355 mL), one 5 oz glass of wine (148 mL), or one 1 oz glass of hard liquor (44 mL). Lifestyle Brush your teeth every morning and night with fluoride toothpaste. Floss one time each day. Exercise for at least 30 minutes 5 or more days each week. Do not use any products that contain nicotine or tobacco. These products include cigarettes, chewing tobacco, and vaping devices, such as e-cigarettes. If you need help quitting, ask your health care provider. Do not use drugs. If you are sexually active, practice safe sex. Use a condom or other form of protection to prevent STIs. Take aspirin only as told by your health care provider. Make sure that you understand how much to take and what form to take. Work with your health care provider to find out whether it is safe   and beneficial for you to take aspirin daily. Ask your health care provider if you need to take a cholesterol-lowering medicine (statin). Find healthy ways to manage stress, such  as: Meditation, yoga, or listening to music. Journaling. Talking to a trusted person. Spending time with friends and family. Safety Always wear your seat belt while driving or riding in a vehicle. Do not drive: If you have been drinking alcohol. Do not ride with someone who has been drinking. When you are tired or distracted. While texting. If you have been using any mind-altering substances or drugs. Wear a helmet and other protective equipment during sports activities. If you have firearms in your house, make sure you follow all gun safety procedures. Minimize exposure to UV radiation to reduce your risk of skin cancer. What's next? Visit your health care provider once a year for an annual wellness visit. Ask your health care provider how often you should have your eyes and teeth checked. Stay up to date on all vaccines. This information is not intended to replace advice given to you by your health care provider. Make sure you discuss any questions you have with your health care provider. Document Revised: 09/08/2020 Document Reviewed: 09/08/2020 Elsevier Patient Education  [place] Maintenance  Topic Date Due   COVID-19 Vaccine (3 - Moderna risk series) 01/08/2020   Urine Protein Check  09/07/2021   Zoster (Shingles) Vaccine (1 of 2) 11/12/2021*   Flu Shot  10/25/2021   Complete foot exam   12/13/2021   Eye exam for diabetics  12/30/2021   Hemoglobin A1C  02/12/2022   Colon Cancer Screening  06/26/2022   Tetanus Vaccine  10/12/2030   Pneumonia Vaccine  Completed   Hepatitis C Screening: USPSTF Recommendation to screen - Ages 27-79 yo.  Completed   HPV Vaccine  Aged Out  *Topic was postponed. The date shown is not the original due date.   Alcohol Abuse and Nutrition Alcohol abuse is any pattern of alcohol consumption that harms your health, relationships, or work. Alcohol abuse can cause poor nutrition (malnutrition or malnourishment) and a lack of  nutrients (nutrient deficiencies), which can lead to more health problems. Alcohol abuse brings malnutrition and nutrient deficiencies in two ways: It causes your liver to work abnormally. This affects how your body divides (breaks down) and absorbs nutrients from food. It causes you to eat poorly. Many people who abuse alcohol do not eat enough carbohydrates, protein, fat, vitamins, and minerals. Nutrients that are commonly lacking (deficient) in people who abuse alcohol include: Vitamins. Vitamin A. This is needed for your vision, metabolism, and ability to fight off infections (immunity). B vitamins. These include folate, thiamine, and niacin. These are needed for new cell growth. Vitamin C. This plays an important role in wound healing, immunity, and helping your body to absorb iron. Vitamin D. This is necessary for your body to absorb and use calcium. It is produced by your liver, but you can also get it from food and from sun exposure. Minerals. Calcium. This is needed for healthy bones as well as heart and blood vessel (cardiovascular) function. Iron. This is important for blood, muscle, and nervous system functioning. Magnesium. This plays an important role in muscle and nerve function, and it helps to control blood sugar and blood pressure. Zinc. This is important for the normal functioning of your nervous system and digestive system (gastrointestinal tract). If you think that you have an alcohol dependency problem, or if it is hard to  stop drinking because you feel sick or different when you do not use alcohol, talk with your health care provider or another health professional about where to get help. Nutrition is an essential factor in the therapy for alcohol abuse. Your health care provider or diet and nutrition specialist (dietitian) will work with you to design a plan that can help to restore nutrients to your body and prevent the risk of complications. What is my plan? Your dietitian  may develop a specific eating plan that is based on your condition and any other problems that you have. An eating plan will commonly include: A balanced diet. Grains: 6-8 oz (170-227 g) a day. Examples of 1 oz of whole grains include 1 cup of whole-wheat cereal,  cup of brown rice, or 1 slice of whole-wheat bread. Vegetables: 2-3 cups a day. Examples of 1 cup of vegetables include 2 medium carrots, 1 large tomato, or 2 stalks of celery. Fruits: 1-2 cups a day. Examples of 1 cup of fruit include 1 large banana, 1 small apple, 8 large strawberries, or 1 large orange. Meat and other protein: 5-6 oz (142-170 g) a day. A cut of meat or fish that is the size of a deck of cards is about 3-4 oz. Foods that provide 1 oz of protein include 1 egg,  cup of nuts or seeds, or 1 tablespoon (16 g) of peanut butter. Dairy: 2-3 cups a day. Examples of 1 cup of dairy include 8 oz (230 mL) of milk, 8 oz (230 g) of yogurt, or 1 oz (44 g) of natural cheese. Vitamin and mineral supplements. What are tips for following this plan? Eat frequent meals and snacks. Try to eat 5-6 small meals each day. Take vitamin or mineral supplements as recommended by your dietitian. If you are malnourished or if your dietitian recommends it: You may follow a high-protein, high-calorie diet. This may include: 2,000-3,000 calories (kilocalories) a day. 70-100 g (grams) of protein a day. You may be directed to follow a diet that includes a complete nutritional supplement beverage. This can help to restore calories, protein, and vitamins to your body. Depending on your condition, you may be advised to consume this beverage instead of your meals or in addition to them. Certain medicines may cause changes in your appetite, taste, and weight. Work with your health care provider and dietitian to make any changes to your medicines and eating plan. If you are unable to take in enough food and calories by mouth, your health care provider may  recommend a feeding tube. This tube delivers nutritional supplements directly to your stomach. Recommended foods Eat foods that are high in molecules that prevent oxygen from reacting with your food (antioxidants). These foods include grapes, berries, nuts, green tea, and dark green or orange vegetables. Eating these can help to prevent some of the stress that is placed on your liver by consuming alcohol. Eat a variety of fresh fruits and vegetables each day. This will help you to get fiber and vitamins in your diet. Drink plenty of water and other clear fluids, such as apple juice and broth. Try to drink at least 48-64 oz (1.5-2 L) of water a day. Include foods fortified with vitamins and minerals in your diet. Commonly fortified foods include milk, orange juice, cereal, and bread. Eat a variety of foods that are high in omega-3 and omega-6 fatty acids. These include fish, nuts and seeds, and soybeans. These foods may help your liver to recover and may also  stabilize your mood. If you are a vegetarian: Eat a variety of protein-rich foods. Pair whole grains with plant-based proteins at meals and snack time. For example, eat rice with beans, put peanut butter on whole-grain toast, or eat oatmeal with sunflower seeds. The items listed above may not be a complete list of foods and beverages you can eat. Contact a dietitian for more information. Foods to avoid Avoid foods and drinks that are high in fat and sugar. Sugary drinks, salty snacks, and candy contain empty calories. This means that they lack important nutrients such as protein, fiber, and vitamins. Avoid alcohol. This is the best way to avoid malnutrition due to alcohol abuse. If you must drink, drink measured amounts. Measured drinking means limiting your intake to no more than 1 drink a day for nonpregnant women and 2 drinks a day for men. One drink equals 12 oz (355 mL) of beer, 5 oz (148 mL) of wine, or 1 oz (44 mL) of hard liquor. Limit  your intake of caffeine. Replace drinks like coffee and black tea with decaffeinated coffee and decaffeinated herbal tea. The items listed above may not be a complete list of foods and beverages you should avoid. Contact a dietitian for more information. Summary Alcohol abuse can cause poor nutrition (malnutrition or malnourishment) and a lack of nutrients (nutrient deficiencies), which can lead to more health problems. Common nutrient deficiencies include vitamin deficiencies (A, B, C, and D) and mineral deficiencies (calcium, iron, magnesium, and zinc). Nutrition is an essential factor in the therapy for alcohol abuse. Your health care provider and dietitian can help you to develop a specific eating plan that includes a balanced diet plus vitamin and mineral supplements. This information is not intended to replace advice given to you by your health care provider. Make sure you discuss any questions you have with your health care provider. Document Revised: 02/04/2021 Document Reviewed: 02/02/2020 Elsevier Patient Education  [place].
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
## 2021-10-07 ENCOUNTER — Ambulatory Visit (INDEPENDENT_AMBULATORY_CARE_PROVIDER_SITE_OTHER): Payer: BC Managed Care – PPO | Admitting: Family Medicine

## 2021-10-07 ENCOUNTER — Encounter: Payer: Self-pay | Admitting: Family Medicine

## 2021-10-07 VITALS — BP 126/78 | HR 95 | Temp 98.0°F | Resp 16 | Ht 68.0 in | Wt 232.1 lb

## 2021-10-07 DIAGNOSIS — Z0000 Encounter for general adult medical examination without abnormal findings: Secondary | ICD-10-CM | POA: Diagnosis not present

## 2021-10-07 DIAGNOSIS — F1020 Alcohol dependence, uncomplicated: Secondary | ICD-10-CM

## 2021-10-07 DIAGNOSIS — E785 Hyperlipidemia, unspecified: Secondary | ICD-10-CM

## 2021-10-07 DIAGNOSIS — E1169 Type 2 diabetes mellitus with other specified complication: Secondary | ICD-10-CM | POA: Diagnosis not present

## 2021-10-07 DIAGNOSIS — E538 Deficiency of other specified B group vitamins: Secondary | ICD-10-CM

## 2021-10-07 DIAGNOSIS — G4733 Obstructive sleep apnea (adult) (pediatric): Secondary | ICD-10-CM | POA: Diagnosis not present

## 2021-10-07 DIAGNOSIS — Z87891 Personal history of nicotine dependence: Secondary | ICD-10-CM

## 2021-10-07 DIAGNOSIS — Z125 Encounter for screening for malignant neoplasm of prostate: Secondary | ICD-10-CM

## 2021-10-07 DIAGNOSIS — I10 Essential (primary) hypertension: Secondary | ICD-10-CM

## 2021-10-07 DIAGNOSIS — E782 Mixed hyperlipidemia: Secondary | ICD-10-CM | POA: Diagnosis not present

## 2021-10-07 DIAGNOSIS — Z23 Encounter for immunization: Secondary | ICD-10-CM

## 2021-10-07 MED ORDER — ZOSTER VAC RECOMB ADJUVANTED 50 MCG/0.5ML IM SUSR: 0.5000 mL | Freq: Once | INTRAMUSCULAR | 0 refills | Status: AC

## 2021-10-08 LAB — MICROALBUMIN / CREATININE URINE RATIO
Creatinine, Urine: 88 mg/dL (ref 20–320)
Microalb Creat Ratio: 44 mcg/mg creat — ABNORMAL HIGH (ref <30)
Microalb, Ur: 3.9 mg/dL

## 2021-10-08 LAB — COMPLETE METABOLIC PANEL WITH GFR
AG Ratio: 1.3 (calc) (ref 1.0–2.5)
ALT: 30 U/L (ref 9–46)
AST: 32 U/L (ref 10–35)
Albumin: 4.1 g/dL (ref 3.6–5.1)
Alkaline phosphatase (APISO): 54 U/L (ref 35–144)
BUN: 9 mg/dL (ref 7–25)
CO2: 28 mmol/L (ref 20–32)
Calcium: 9.6 mg/dL (ref 8.6–10.3)
Chloride: 104 mmol/L (ref 98–110)
Creat: 0.86 mg/dL (ref 0.70–1.35)
Globulin: 3.1 g/dL (calc) (ref 1.9–3.7)
Glucose, Bld: 126 mg/dL — ABNORMAL HIGH (ref 65–99)
Potassium: 4 mmol/L (ref 3.5–5.3)
Sodium: 141 mmol/L (ref 135–146)
Total Bilirubin: 0.6 mg/dL (ref 0.2–1.2)
Total Protein: 7.2 g/dL (ref 6.1–8.1)
eGFR: 95 mL/min/{1.73_m2} (ref >=60)

## 2021-10-08 LAB — LIPID PANEL
Cholesterol: 247 mg/dL — ABNORMAL HIGH (ref <200)
HDL: 62 mg/dL (ref >=40)
LDL Cholesterol (Calc): 158 mg/dL (calc) — ABNORMAL HIGH
Non-HDL Cholesterol (Calc): 185 mg/dL (calc) — ABNORMAL HIGH (ref <130)
Total CHOL/HDL Ratio: 4 (calc) (ref <5.0)
Triglycerides: 143 mg/dL (ref <150)

## 2021-10-08 LAB — VITAMIN B12: Vitamin B-12: 557 pg/mL (ref 200–1100)

## 2021-10-08 LAB — PSA: PSA: 0.99 ng/mL (ref <=4.00)

## 2021-10-24 ENCOUNTER — Encounter: Payer: Self-pay | Admitting: Family Medicine

## 2021-10-26 ENCOUNTER — Ambulatory Visit: Payer: Self-pay | Admitting: *Deleted

## 2021-10-26 DIAGNOSIS — I10 Essential (primary) hypertension: Secondary | ICD-10-CM | POA: Diagnosis not present

## 2021-10-26 DIAGNOSIS — M62838 Other muscle spasm: Secondary | ICD-10-CM | POA: Diagnosis not present

## 2021-10-26 DIAGNOSIS — M542 Cervicalgia: Secondary | ICD-10-CM | POA: Diagnosis not present

## 2021-10-26 NOTE — Telephone Encounter (Signed)
Reason for Disposition  Neck pain or stiffness    Wants to see a chiropractor  Answer Assessment - Initial Assessment Questions 1. ONSET: "When did the pain begin?"      I have ac reck in my neck for 3 days.   Sunday morning I woke up with it.   I tried [employer] and massage. I want a chiropractor referral.    2. LOCATION: "Where does it hurt?"      My neck I woke up Sunday morning with a crick in my neck and it is not going away.   I want to see a chiropractor.   Do I need a referral?   I let him know he could call a chiropractor of his choosing.    He thanked me for my help and was agreeable to calling a chiropractor. 3. PATTERN "Does the pain come and go, or has it been constant since it started?"      Not going away with Ephraim Hamburger or massage 4. SEVERITY: "How bad is the pain?"  (Scale 1-10; or mild, moderate, severe)   - NO PAIN (0): no pain or only slight stiffness    - MILD (1-3): doesn't interfere with normal activities    - MODERATE (4-7): interferes with normal activities or awakens from sleep    - SEVERE (8-10):  excruciating pain, unable to do any normal activities      Moderate   Hard to turn my head without it hurting. 5. RADIATION: "Does the pain go anywhere else, shoot into your arms?"     Not asked 6. CORD SYMPTOMS: "Any weakness or numbness of the arms or legs?"     N/A 7. CAUSE: "What do you think is causing the neck pain?"     Crick in my neck 8. NECK OVERUSE: "Any recent activities that involved turning or twisting the neck?"     No woke up this way Sunday morning 9. OTHER SYMPTOMS: "Do you have any other symptoms?" (e.g., headache, fever, chest pain, difficulty breathing, neck swelling)     No 10. PREGNANCY: "Is there any chance you are pregnant?" "When was your last menstrual period?"       N/A  Protocols used: Neck Pain or Stiffness-A-AH

## 2021-11-18 ENCOUNTER — Ambulatory Visit: Payer: BC Managed Care – PPO | Admitting: Family Medicine

## 2021-11-18 ENCOUNTER — Encounter: Payer: Self-pay | Admitting: Family Medicine

## 2021-11-18 VITALS — BP 136/74 | HR 90 | Temp 98.2°F | Resp 16 | Ht 68.0 in | Wt 230.3 lb

## 2021-11-18 DIAGNOSIS — Z23 Encounter for immunization: Secondary | ICD-10-CM | POA: Diagnosis not present

## 2021-11-18 DIAGNOSIS — Z8673 Personal history of transient ischemic attack (TIA), and cerebral infarction without residual deficits: Secondary | ICD-10-CM | POA: Diagnosis not present

## 2021-11-18 DIAGNOSIS — E538 Deficiency of other specified B group vitamins: Secondary | ICD-10-CM

## 2021-11-18 DIAGNOSIS — E1169 Type 2 diabetes mellitus with other specified complication: Secondary | ICD-10-CM

## 2021-11-18 DIAGNOSIS — Z8719 Personal history of other diseases of the digestive system: Secondary | ICD-10-CM

## 2021-11-18 DIAGNOSIS — G939 Disorder of brain, unspecified: Secondary | ICD-10-CM

## 2021-11-18 DIAGNOSIS — E1159 Type 2 diabetes mellitus with other circulatory complications: Secondary | ICD-10-CM

## 2021-11-18 DIAGNOSIS — M5136 Other intervertebral disc degeneration, lumbar region: Secondary | ICD-10-CM | POA: Insufficient documentation

## 2021-11-18 DIAGNOSIS — I69352 Hemiplegia and hemiparesis following cerebral infarction affecting left dominant side: Secondary | ICD-10-CM | POA: Insufficient documentation

## 2021-11-18 DIAGNOSIS — M5030 Other cervical disc degeneration, unspecified cervical region: Secondary | ICD-10-CM

## 2021-11-18 DIAGNOSIS — E785 Hyperlipidemia, unspecified: Secondary | ICD-10-CM

## 2021-11-18 DIAGNOSIS — M51369 Other intervertebral disc degeneration, lumbar region without mention of lumbar back pain or lower extremity pain: Secondary | ICD-10-CM

## 2021-11-18 DIAGNOSIS — I10 Essential (primary) hypertension: Secondary | ICD-10-CM

## 2021-11-18 DIAGNOSIS — E782 Mixed hyperlipidemia: Secondary | ICD-10-CM

## 2021-11-18 MED ORDER — AMLODIPINE BESYLATE-VALSARTAN 10-160 MG PO TABS: 1.0000 | ORAL_TABLET | Freq: Every day | ORAL | 1 refills | Status: DC

## 2021-11-18 MED ORDER — ZOSTER VAC RECOMB ADJUVANTED 50 MCG/0.5ML IM SUSR: 0.5000 mL | Freq: Once | INTRAMUSCULAR | 1 refills | Status: AC

## 2021-11-18 NOTE — Assessment & Plan Note (Signed)
Pt has poor understanding of his stroke early last year. He notes some persistent numbness to left hand and foot, denies any new sx He has not been taking statin He has not followed up with neurology Reviewed secondary prevention - wrote out on AVS paperwork today his plan to resume statin, get HTN and DM controlled, avoid ETOH, and follow up with neurology

## 2021-11-18 NOTE — Assessment & Plan Note (Signed)
Patient has forgotten to take his statin which she does have at home Last lipids elevated Explained how with diabetes he will always be on a statin to reduce the risk of heart attack and stroke, with a recent stroke he should also be on this medicine long-term for secondary prevention Lab Results  Component Value Date   CHOL 247 (H) 10/07/2021   HDL 62 10/07/2021   LDLCALC 158 (H) 10/07/2021   TRIG 143 10/07/2021   CHOLHDL 4.0 10/07/2021  I reviewed with him and put on his after visit summary his last 3 years of lipid panels and showed him how his numbers are significantly improved when he is compliant with taking his statin daily, he has tolerated this in the past he was encouraged to restart and we will recheck labs in 3 months

## 2021-11-18 NOTE — Assessment & Plan Note (Signed)
Lab Results  Component Value Date   [id number] 10/07/2021   Last B12 normal, has improved with decreased alcohol consumption and B12 supplementation

## 2021-11-18 NOTE — Patient Instructions (Addendum)
Start taking your lipitor / atorvastatin 40 mg once daily at bedtime   Lab Results  Component Value Date   CHOL 247 (H) 10/07/2021   CHOL 169 09/07/2020   CHOL 228 (H) 04/27/2020   Lab Results  Component Value Date   HDL 62 10/07/2021   HDL 73 09/07/2020   HDL 63 04/27/2020   Lab Results  Component Value Date   LDLCALC 158 (H) 10/07/2021   LDLCALC 70 09/07/2020   LDLCALC 135 (H) 04/27/2020   Lab Results  Component Value Date   TRIG 143 10/07/2021   TRIG 190 (H) 09/07/2020   TRIG 151 (H) 04/27/2020   Lab Results  Component Value Date   CHOLHDL 4.0 10/07/2021   CHOLHDL 2.3 09/07/2020   CHOLHDL 3.6 04/27/2020   No results found for: "LDLDIRECT"   Call [facility] to get your appointment with Neurology for follow up on stroke and abnormal MRI  Stop taking amlodipine, and start taking new BP medicine amLODipine-valsartan - once daily in the morning  Return in 3 months for labs and follow up
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
## 2021-11-18 NOTE — Assessment & Plan Note (Signed)
avoid ASA and NSAIDs, advised the ETOH also can cause gastritis/ulcer bleeding

## 2021-11-18 NOTE — Assessment & Plan Note (Signed)
Recurrent sx - usually goes to [facility], last xrays and OV there reviewed he will likely need to consult with ortho or spine specialsits

## 2021-11-18 NOTE — Assessment & Plan Note (Signed)
Blood pressure initially elevated today and slightly improved with recheck Was managed previously on amlodipine -I have changed his medication today to include valsartan for renal protection, his med changes were reviewed with him thoroughly and written on his after visit summary and also annotated and reviewed with him prior to him leaving the clinic today BP Readings from Last 3 Encounters:  11/18/21 136/74  10/07/21 126/78  08/16/21 123/80

## 2021-11-18 NOTE — Assessment & Plan Note (Addendum)
noted on 04/26/2020 MRI, advised f/up MRI with contrast f/up in 4 months, lost to f/up Will order f/up MRI and refer again to neurology I discussed neurology f/up with him at length and put info on AVS with note to call and ensure he gets the f/up appt Patient denies headaches, vision changes, any focal numbness, weakness, cognitive changes, facial droop

## 2021-11-18 NOTE — Assessment & Plan Note (Signed)
Recurrent sx - usually goes to [facility], last xrays and OV there reviewed he will likely need to consult with ortho or spine specialsits Recent flare tx with prednisone which he tolerated and muscle relaxers

## 2021-11-18 NOTE — Assessment & Plan Note (Signed)
Currently managed only on Jardiance he does have urinary frequency but has not had any recurrent UTIs Last A1c was slightly elevated above goal encouraged him to continue to take medications and work on healthier diet, will recheck at his next follow-up visit Screenings and labs have been done for the year but unfortunately he has stopped taking his statin and he is not on a ACE or ARB I explained that he needs to resume taking statin daily, added ARB to his amlodipine, will do a 31-monthfollow-up

## 2021-11-24 ENCOUNTER — Ambulatory Visit: Admission: RE | Admit: 2021-11-24 | Payer: BC Managed Care – PPO | Source: Ambulatory Visit

## 2021-12-07 ENCOUNTER — Ambulatory Visit: Payer: BC Managed Care – PPO

## 2021-12-09 ENCOUNTER — Ambulatory Visit
Admission: RE | Admit: 2021-12-09 | Discharge: 2021-12-09 | Disposition: A | Discharge status: Home / Self Care | Payer: BC Managed Care – PPO | Source: Ambulatory Visit | Attending: Family Medicine | Admitting: Family Medicine

## 2021-12-09 DIAGNOSIS — Z8673 Personal history of transient ischemic attack (TIA), and cerebral infarction without residual deficits: Secondary | ICD-10-CM

## 2021-12-09 DIAGNOSIS — G9389 Other specified disorders of brain: Secondary | ICD-10-CM | POA: Diagnosis not present

## 2021-12-09 DIAGNOSIS — G939 Disorder of brain, unspecified: Secondary | ICD-10-CM | POA: Diagnosis not present

## 2021-12-09 MED ORDER — GADOBUTROL 1 MMOL/ML IV SOLN
10.0000 mL | Freq: Once | INTRAVENOUS | Status: AC | PRN
  Administered 2021-12-09: 10 mL via INTRAVENOUS

## 2022-02-10 ENCOUNTER — Ambulatory Visit: Payer: BC Managed Care – PPO | Admitting: Family Medicine

## 2022-02-14 ENCOUNTER — Ambulatory Visit: Payer: BC Managed Care – PPO | Admitting: Family Medicine

## 2022-02-14 DIAGNOSIS — E1169 Type 2 diabetes mellitus with other specified complication: Secondary | ICD-10-CM

## 2022-02-14 DIAGNOSIS — E1159 Type 2 diabetes mellitus with other circulatory complications: Secondary | ICD-10-CM

## 2022-02-14 DIAGNOSIS — Z23 Encounter for immunization: Secondary | ICD-10-CM

## 2022-02-15 ENCOUNTER — Other Ambulatory Visit: Payer: Self-pay | Admitting: Family Medicine

## 2022-02-15 DIAGNOSIS — E1165 Type 2 diabetes mellitus with hyperglycemia: Secondary | ICD-10-CM

## 2022-02-15 NOTE — Telephone Encounter (Signed)
Requested Prescriptions  Pending Prescriptions Disp Refills   JARDIANCE 25 MG TABS tablet [Pharmacy Med Name: JARDIANCE 25 MG TABLET] 30 tablet 2    Sig: TAKE 1 TABLET BY MOUTH DAILY BEFORE BREAKFAST.     Endocrinology:  Diabetes - SGLT2 Inhibitors Failed - 02/15/2022  3:37 PM      Failed - HBA1C is between 0 and 7.9 and within 180 days    Hemoglobin A1C  Date Value Ref Range Status  08/12/2021 7.1 (A) 4.0 - 5.6 % Final   Hgb A1c MFr Bld  Date Value Ref Range Status  12/13/2020 6.7 (H) <5.7 % of total Hgb Final    Comment:    For someone without known diabetes, a hemoglobin A1c value of 6.5% or greater indicates that they may have  diabetes and this should be confirmed with a follow-up  test. . For someone with known diabetes, a value <7% indicates  that their diabetes is well controlled and a value  greater than or equal to 7% indicates suboptimal  control. A1c targets should be individualized based on  duration of diabetes, age, comorbid conditions, and  other considerations. . Currently, no consensus exists regarding use of hemoglobin A1c for diagnosis of diabetes for children. .          Passed - Cr in normal range and within 360 days    Creat  Date Value Ref Range Status  10/07/2021 0.86 0.70 - 1.35 mg/dL Final   Creatinine, Urine  Date Value Ref Range Status  10/07/2021 88 20 - 320 mg/dL Final         Passed - eGFR in normal range and within 360 days    GFR, Est African American  Date Value Ref Range Status  03/12/2020 94 > OR = 60 mL/min/1.26m Final   GFR, Est Non African American  Date Value Ref Range Status  03/12/2020 81 > OR = 60 mL/min/1.743mFinal   GFR, Estimated  Date Value Ref Range Status  08/16/2021 >60 >60 mL/min Final    Comment:    (NOTE) Calculated using the CKD-EPI Creatinine Equation (2021)    eGFR  Date Value Ref Range Status  10/07/2021 95 > OR = 60 mL/min/1.734minal    Comment:    The eGFR is based on the CKD-EPI 2021  equation. To calculate  the new eGFR from a previous Creatinine or Cystatin C result, go to https://www.kidney.org/professionals/ kdoqi/gfr%5Fcalculator          Passed - Valid encounter within last 6 months    Recent Outpatient Visits           2 months ago Hypertension goal BP (blood pressure) < 140/90   [facility]   4 months ago Annual physical exam   [facility]   6 months [facility] with type [facility] diabetes [facility]   9 months ago Hypertension goal BP (blood pressure) < 140/90   [facility]   10 months ago [facility] with [facility]       Future Appointments             In 1 week TapDelsa GranaA[facility]C [facility]Delsa Gr[facility]A-C [facility]

## 2022-02-17 ENCOUNTER — Ambulatory Visit: Payer: BC Managed Care – PPO | Admitting: Family Medicine

## 2022-02-24 ENCOUNTER — Ambulatory Visit: Payer: BC Managed Care – PPO | Admitting: Family Medicine

## 2022-04-07 ENCOUNTER — Ambulatory Visit: Payer: BC Managed Care – PPO | Admitting: Family Medicine
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
## 2022-04-14 ENCOUNTER — Encounter: Payer: Self-pay | Admitting: Internal Medicine

## 2022-04-14 ENCOUNTER — Ambulatory Visit (INDEPENDENT_AMBULATORY_CARE_PROVIDER_SITE_OTHER): Payer: Medicare Other | Admitting: Internal Medicine

## 2022-04-14 ENCOUNTER — Telehealth: Payer: Self-pay

## 2022-04-14 ENCOUNTER — Other Ambulatory Visit: Payer: Self-pay

## 2022-04-14 VITALS — BP 150/98 | HR 99 | Temp 98.4°F | Resp 16 | Ht 68.0 in | Wt 235.1 lb

## 2022-04-14 DIAGNOSIS — I10 Essential (primary) hypertension: Secondary | ICD-10-CM

## 2022-04-14 DIAGNOSIS — Z1211 Encounter for screening for malignant neoplasm of colon: Secondary | ICD-10-CM

## 2022-04-14 DIAGNOSIS — Z86010 Personal history of colonic polyps: Secondary | ICD-10-CM

## 2022-04-14 DIAGNOSIS — K219 Gastro-esophageal reflux disease without esophagitis: Secondary | ICD-10-CM | POA: Diagnosis not present

## 2022-04-14 DIAGNOSIS — E1159 Type 2 diabetes mellitus with other circulatory complications: Secondary | ICD-10-CM

## 2022-04-14 DIAGNOSIS — E782 Mixed hyperlipidemia: Secondary | ICD-10-CM | POA: Diagnosis not present

## 2022-04-14 LAB — POCT GLYCOSYLATED HEMOGLOBIN (HGB A1C): Hemoglobin A1C: 7.4 % — AB (ref 4.0–5.6)

## 2022-04-14 MED ORDER — EMPAGLIFLOZIN 25 MG PO TABS: 25.0000 mg | ORAL_TABLET | Freq: Every day | ORAL | 1 refills | Status: DC

## 2022-04-14 MED ORDER — PANTOPRAZOLE SODIUM 40 MG PO TBEC: 40.0000 mg | DELAYED_RELEASE_TABLET | Freq: Every day | ORAL | 1 refills | Status: DC

## 2022-04-14 MED ORDER — AMLODIPINE BESYLATE-VALSARTAN 10-160 MG PO TABS: 1.0000 | ORAL_TABLET | Freq: Every day | ORAL | 1 refills | Status: DC

## 2022-04-14 MED ORDER — NA SULFATE-K SULFATE-MG SULF 17.5-3.13-1.6 GM/177ML PO SOLN: 1.0000 | Freq: Once | ORAL | 0 refills | Status: AC

## 2022-04-14 MED ORDER — ATORVASTATIN CALCIUM 40 MG PO TABS: 40.0000 mg | ORAL_TABLET | Freq: Every day | ORAL | 1 refills | Status: DC

## 2022-04-14 NOTE — Patient Instructions (Signed)
It was great seeing you today!  Plan discussed at today's visit: -A1c 7.4% -Medications refilled -Take BP medication every day  -Monitor BP at home -Referral for colonoscopy today  Follow up in: 1 month  Take care and let us know if you have any questions or concerns prior to your next visit.  Dr. Rosana Berger

## 2022-04-14 NOTE — Telephone Encounter (Signed)
Gastroenterology Pre-Procedure Review  Request Date: 04/26/22 Requesting Physician: Dr. Vicente Males  PATIENT REVIEW QUESTIONS: The patient responded to the following health history questions as indicated:    1. Are you having any GI issues? yes (Diarrhea) 2. Do you have a personal history of Polyps? yes (06/26/19 colonoscopy performed by Dr. Vicente Males) 3. Do you have a family history of Colon Cancer or Polyps?  No family colon cancer 4. Diabetes Mellitus? yes (takes jardiance advised to stop on 04/24/22) 5. Joint replacements in the past 12 months?no 6. Major health problems in the past 3 months?no 7. Any artificial heart valves, MVP, or defibrillator?no    MEDICATIONS & ALLERGIES:    Patient reports the following regarding taking any anticoagulation/antiplatelet therapy:   Plavix, Coumadin, Eliquis, Xarelto, Lovenox, Pradaxa, Brilinta, or Effient? no Aspirin? no  Patient confirms/reports the following medications:  Current Outpatient Medications  Medication Sig Dispense Refill   amLODipine-valsartan (EXFORGE) 10-160 MG tablet Take 1 tablet by mouth daily. 90 tablet 1   atorvastatin (LIPITOR) 40 MG tablet Take 1 tablet (40 mg total) by mouth at bedtime. 90 tablet 1   Cyanocobalamin (VITAMIN B-12) 1000 MCG SUBL Place 1 tablet (1,000 mcg total) under the tongue daily at 12 noon. 100 tablet 1   empagliflozin (JARDIANCE) 25 MG TABS tablet Take 1 tablet (25 mg total) by mouth daily before breakfast. 90 tablet 1   pantoprazole (PROTONIX) 40 MG tablet Take 1 tablet (40 mg total) by mouth daily. 90 tablet 1   No current facility-administered medications for this visit.    Patient confirms/reports the following allergies:  Allergies  Allergen Reactions   Shellfish Allergy Anaphylaxis   Metformin And Related Itching    No orders of the defined types were placed in this encounter.   AUTHORIZATION INFORMATION Primary Insurance: 1D#: Group #:  Secondary Insurance: 1D#: Group #:  SCHEDULE  INFORMATION: Date: 04/26/22 Time: Location: [facility]

## 2022-04-20 ENCOUNTER — Telehealth: Payer: Self-pay | Admitting: Gastroenterology

## 2022-04-20 NOTE — Telephone Encounter (Signed)
Returned patients call to clarify procedure date.  I incorrectly noted procedure date as 04/28/22.  The correct date is 04/26/22. I was unable to reach him by phone due to voice mail not set up, but I did leave a message on his wife's number for him to call me back.  Thanks, [place], [place]

## 2022-04-20 NOTE — Telephone Encounter (Signed)
Patients wife returned phone call.  Informed her of her husband's correct procedure date 04/26/22.  Thanks, [place], [place]

## 2022-04-20 NOTE — Telephone Encounter (Signed)
Patient needs to be called to confirm procedure date. Instructions say something different then what he is scheduled for.

## 2022-04-24 DIAGNOSIS — H2513 Age-related nuclear cataract, bilateral: Secondary | ICD-10-CM | POA: Diagnosis not present

## 2022-04-24 DIAGNOSIS — E119 Type 2 diabetes mellitus without complications: Secondary | ICD-10-CM | POA: Diagnosis not present

## 2022-04-25 ENCOUNTER — Telehealth: Payer: Self-pay

## 2022-04-25 NOTE — Telephone Encounter (Signed)
Patient called office requesting to reschedule his colonoscopy because he thought it was on 04/28/22.  I informed him that I spoke with his wife Altha Harm on 01/25 and clarified the date for his colonoscopy. Christine then told him that he is doing his clear liquid diet today in preparation for tomorrow's colonoscopy.  His wife Altha Harm said that he is fine and she understands that he should do the 1st prep at 5pm and 2nd prep at 5am (5 hours before procedure).  Altha Harm said she will have him there tomorrow at 10am for his colonoscopy.  Thanks,  [place], [place]

## 2022-04-26 ENCOUNTER — Encounter
Admission: RE | Disposition: A | Discharge status: Home / Self Care | Payer: Self-pay | Source: Home / Self Care | Attending: Gastroenterology

## 2022-04-26 ENCOUNTER — Ambulatory Visit: Payer: Medicare Other | Admitting: Anesthesiology

## 2022-04-26 ENCOUNTER — Encounter: Payer: Self-pay | Admitting: Gastroenterology

## 2022-04-26 ENCOUNTER — Ambulatory Visit
Admission: RE | Admit: 2022-04-26 | Discharge: 2022-04-26 | Disposition: A | Discharge status: Home / Self Care | Payer: Medicare Other | Attending: Gastroenterology | Admitting: Gastroenterology

## 2022-04-26 DIAGNOSIS — D124 Benign neoplasm of descending colon: Secondary | ICD-10-CM | POA: Diagnosis not present

## 2022-04-26 DIAGNOSIS — Z6834 Body mass index (BMI) 34.0-34.9, adult: Secondary | ICD-10-CM | POA: Diagnosis not present

## 2022-04-26 DIAGNOSIS — E669 Obesity, unspecified: Secondary | ICD-10-CM | POA: Insufficient documentation

## 2022-04-26 DIAGNOSIS — E785 Hyperlipidemia, unspecified: Secondary | ICD-10-CM | POA: Diagnosis not present

## 2022-04-26 DIAGNOSIS — Z1211 Encounter for screening for malignant neoplasm of colon: Secondary | ICD-10-CM | POA: Insufficient documentation

## 2022-04-26 DIAGNOSIS — I10 Essential (primary) hypertension: Secondary | ICD-10-CM | POA: Diagnosis not present

## 2022-04-26 DIAGNOSIS — Z86010 Personal history of colonic polyps: Secondary | ICD-10-CM | POA: Diagnosis not present

## 2022-04-26 DIAGNOSIS — D123 Benign neoplasm of transverse colon: Secondary | ICD-10-CM | POA: Diagnosis not present

## 2022-04-26 DIAGNOSIS — I69398 Other sequelae of cerebral infarction: Secondary | ICD-10-CM | POA: Insufficient documentation

## 2022-04-26 DIAGNOSIS — G4730 Sleep apnea, unspecified: Secondary | ICD-10-CM | POA: Insufficient documentation

## 2022-04-26 DIAGNOSIS — D120 Benign neoplasm of cecum: Secondary | ICD-10-CM | POA: Diagnosis not present

## 2022-04-26 DIAGNOSIS — D126 Benign neoplasm of colon, unspecified: Secondary | ICD-10-CM | POA: Diagnosis not present

## 2022-04-26 DIAGNOSIS — Z87891 Personal history of nicotine dependence: Secondary | ICD-10-CM | POA: Insufficient documentation

## 2022-04-26 HISTORY — PX: COLONOSCOPY WITH PROPOFOL: SHX5780

## 2022-04-26 LAB — GLUCOSE, CAPILLARY: Glucose-Capillary: 147 mg/dL — ABNORMAL HIGH (ref 70–99)

## 2022-04-26 SURGERY — COLONOSCOPY WITH PROPOFOL
Anesthesia: General

## 2022-04-26 MED ORDER — SODIUM CHLORIDE 0.9 % IV SOLN
INTRAVENOUS | Status: DC
  Administered 2022-04-26: 1000 mL via INTRAVENOUS

## 2022-04-26 MED ORDER — STERILE WATER FOR IRRIGATION IR SOLN
Status: DC | PRN
  Administered 2022-04-26: 50 mL

## 2022-04-26 MED ORDER — PROPOFOL 500 MG/50ML IV EMUL
INTRAVENOUS | Status: DC | PRN
  Administered 2022-04-26: 100 mg via INTRAVENOUS
  Administered 2022-04-26: 150 ug/kg/min via INTRAVENOUS

## 2022-04-26 NOTE — Anesthesia Postprocedure Evaluation (Signed)
Anesthesia Post Note  Patient: Paul Ingram  Procedure(s) Performed: COLONOSCOPY WITH PROPOFOL  Patient location during evaluation: PACU Anesthesia Type: General Level of consciousness: awake and alert Pain management: pain level controlled Vital Signs Assessment: post-procedure vital signs reviewed and stable Respiratory status: spontaneous breathing, nonlabored ventilation and respiratory function stable Cardiovascular status: blood pressure returned to baseline and stable Postop Assessment: no apparent nausea or vomiting Anesthetic complications: no   No notable events documented.   Last Vitals:  Vitals:   04/26/22 1130 04/26/22 1140  BP: 119/88 (!) 128/91  Pulse: 88 84  Resp: (!) 22 18  Temp:    SpO2: 100% 100%    Last Pain:  Vitals:   04/26/22 1140  TempSrc:   PainSc: 0-No pain                 [place]

## 2022-04-26 NOTE — Transfer of Care (Signed)
Immediate Anesthesia Transfer of Care Note  Patient: Paul Ingram  Procedure(s) Performed: COLONOSCOPY WITH PROPOFOL  Patient Location: PACU  Anesthesia Type:MAC  Level of Consciousness: drowsy  Airway & Oxygen Therapy: Patient Spontanous Breathing and Patient connected to face mask oxygen  Post-op Assessment: Report given to RN and Post -op Vital signs reviewed and stable  Post vital signs: Reviewed  Last Vitals:  Vitals Value Taken Time  BP 102/80 04/26/22 1120  Temp 45F   Pulse 92 04/26/22 1120  Resp 20 04/26/22 1120  SpO2 99 % 04/26/22 1120    Last Pain:  Vitals:   04/26/22 1120  TempSrc:   PainSc: Asleep         Complications: No notable events documented.

## 2022-04-26 NOTE — Op Note (Signed)
[facility] Gastroenterology Patient Name: Paul Ingram Procedure Date: 04/26/2022 10:43 AM MRN: [id number] Account #: [id number] Date of Birth: February 16, 1956 Admit Type: Outpatient Age: 67 Room: [facility] ENDO ROOM 3 Gender: Male Note Status: Finalized Instrument Name: Jasper Riling [id number] Procedure:             Colonoscopy Indications:           Surveillance: Personal history of adenomatous polyps                         on last colonoscopy 3 years ago Providers:             Jonathon Bellows MD, MD Medicines:             Monitored Anesthesia Care Complications:         No immediate complications. Procedure:             Pre-Anesthesia Assessment:                        - Prior to the procedure, a History and Physical was                         performed, and patient medications, allergies and                         sensitivities were reviewed. The patient's tolerance                         of previous anesthesia was reviewed.                        - The risks and benefits of the procedure and the                         sedation options and risks were discussed with the                         patient. All questions were answered and informed                         consent was obtained.                        - ASA Grade Assessment: II - A patient with mild                         systemic disease.                        After obtaining informed consent, the colonoscope was                         passed under direct vision. Throughout the procedure,                         the patient's blood pressure, pulse, and oxygen                         saturations were monitored continuously. The  Colonoscope was introduced through the anus and                         advanced to the the cecum, identified by the                         appendiceal orifice. The colonoscopy was performed                         with ease. The patient tolerated the procedure  well.                         The quality of the bowel preparation was good. The                         ileocecal valve, appendiceal orifice, and rectum were                         photographed. Findings:      Three sessile polyps were found in the transverse colon and cecum. The       polyps were 5 to 8 mm in size. These polyps were removed with a cold       snare. Resection and retrieval were complete.      A 5 mm polyp was found in the descending colon. The polyp was sessile.       The polyp was removed with a cold snare. Resection and retrieval were       complete.      The exam was otherwise without abnormality on direct and retroflexion       views. Impression:            - Three 5 to 8 mm polyps in the transverse colon and                         in the cecum, removed with a cold snare. Resected and                         retrieved.                        - One 5 mm polyp in the descending colon, removed with                         a cold snare. Resected and retrieved.                        - The examination was otherwise normal on direct and                         retroflexion views. Recommendation:        - Discharge patient to home (with escort).                        - Resume previous diet.                        - Continue present medications.                        -  Await pathology results.                        - Repeat colonoscopy in 3 years for surveillance based                         on pathology results. Procedure Code(s):     --- Professional ---                        [phone], Colonoscopy, flexible; with removal of                         tumor(s), polyp(s), or other lesion(s) by snare                         technique Diagnosis Code(s):     --- Professional ---                        Z86.010, Personal history of colonic polyps                        D12.3, Benign neoplasm of transverse colon (hepatic                         flexure or splenic flexure)                         D12.0, Benign neoplasm of cecum                        D12.4, Benign neoplasm of descending colon CPT copyright 2022 American Medical Association. All rights reserved. The codes documented in this report are preliminary and upon coder review may  be revised to meet current compliance requirements. Jonathon Bellows, MD Jonathon Bellows MD, MD 04/26/2022 11:15:28 AM This report has been signed electronically. Number of Addenda: 0 Note Initiated On: 04/26/2022 10:43 AM Scope Withdrawal Time: 0 hours 11 minutes 53 seconds  Total Procedure Duration: 0 hours 14 minutes 18 seconds  Estimated Blood Loss:  Estimated blood loss: none.      [facility]
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
## 2022-04-27 ENCOUNTER — Encounter: Payer: Self-pay | Admitting: Gastroenterology

## 2022-04-27 LAB — SURGICAL PATHOLOGY
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
## 2022-05-15 ENCOUNTER — Emergency Department: Payer: Medicare Other

## 2022-05-15 ENCOUNTER — Emergency Department
Admission: EM | Admit: 2022-05-15 | Discharge: 2022-05-15 | Disposition: A | Discharge status: Home / Self Care | Payer: Medicare Other | Attending: Emergency Medicine | Admitting: Emergency Medicine

## 2022-05-15 ENCOUNTER — Ambulatory Visit: Payer: Medicare Other | Admitting: Internal Medicine

## 2022-05-15 DIAGNOSIS — R200 Anesthesia of skin: Secondary | ICD-10-CM | POA: Diagnosis not present

## 2022-05-15 DIAGNOSIS — E119 Type 2 diabetes mellitus without complications: Secondary | ICD-10-CM | POA: Diagnosis not present

## 2022-05-15 DIAGNOSIS — R202 Paresthesia of skin: Secondary | ICD-10-CM | POA: Insufficient documentation

## 2022-05-15 DIAGNOSIS — M546 Pain in thoracic spine: Secondary | ICD-10-CM | POA: Diagnosis not present

## 2022-05-15 DIAGNOSIS — M4802 Spinal stenosis, cervical region: Secondary | ICD-10-CM | POA: Diagnosis not present

## 2022-05-15 DIAGNOSIS — R519 Headache, unspecified: Secondary | ICD-10-CM | POA: Diagnosis not present

## 2022-05-15 DIAGNOSIS — Z8673 Personal history of transient ischemic attack (TIA), and cerebral infarction without residual deficits: Secondary | ICD-10-CM | POA: Diagnosis not present

## 2022-05-15 DIAGNOSIS — M542 Cervicalgia: Secondary | ICD-10-CM | POA: Insufficient documentation

## 2022-05-15 DIAGNOSIS — M47814 Spondylosis without myelopathy or radiculopathy, thoracic region: Secondary | ICD-10-CM | POA: Diagnosis not present

## 2022-05-15 MED ORDER — CYCLOBENZAPRINE HCL 10 MG PO TABS
5.0000 mg | ORAL_TABLET | Freq: Once | ORAL | Status: AC
  Administered 2022-05-15: 5 mg via ORAL
  Filled 2022-05-15: qty 1

## 2022-05-15 MED ORDER — ACETAMINOPHEN 325 MG PO TABS
650.0000 mg | ORAL_TABLET | Freq: Once | ORAL | Status: AC
  Administered 2022-05-15: 650 mg via ORAL
  Filled 2022-05-15: qty 2

## 2022-05-15 MED ORDER — CYCLOBENZAPRINE HCL 10 MG PO TABS: 10.0000 mg | ORAL_TABLET | Freq: Three times a day (TID) | ORAL | 0 refills | Status: DC | PRN

## 2022-05-15 NOTE — ED Provider Notes (Signed)
[facility] Provider Note   Event Date/Time   First MD Initiated Contact with Patient 05/15/22 [phone]     (approximate) History  Back Pain, Numbness, and Headache  HPI Paul Ingram is a 67 y.o. male with a past medical history of CVA diabetes who presents for left upper extremity weakness and tingling, left lower extremity tingling, and midthoracic back pain.  Patient states that the midthoracic back pain is anything that is new for him at this time as he states the tingling in the left upper and lower extremity are stable from his previous stroke.  Patient states that at approximately 8:00 last night he began having acute thoracic midline back pain that has kept him from sleep overnight.  Patient describes 8/10, nonradiating, aching, midline mid thoracic back pain that has no relieving factors.  Patient states that any bending over worsens this pain and he has not taken any medications for it. ROS: Patient currently denies any vision changes, tinnitus, difficulty speaking, facial droop, sore throat, chest pain, shortness of breath, abdominal pain, nausea/vomiting/diarrhea, dysuria, or weakness/numbness/paresthesias in any extremity   Physical Exam  Triage Vital Signs: ED Triage Vitals [05/15/22 0739]  Enc Vitals Group     BP (!) 152/111     Pulse Rate 99     Resp 20     Temp 98.2 F (36.8 C)     Temp Source Oral     SpO2 96 %     Weight      Height      Head Circumference      Peak Flow      Pain Score      Pain Loc      Pain Edu?      Excl. in [place]?    Most recent vital signs: Vitals:   05/15/22 0739  BP: (!) 152/111  Pulse: 99  Resp: 20  Temp: 98.2 F (36.8 C)  SpO2: 96%   General: Awake, oriented x4. CV:  Good peripheral perfusion.  Resp:  Normal effort.  Abd:  No distention.  Other:  Elderly overweight African-American male laying in bed in no acute distress.  Subjective numbness over the last 3 fingers on the palmar aspect as well as the  left lower extremity below the knee.  Strength intact in all extremities. ED Results / Procedures / Treatments  Labs (all labs ordered are listed, but only abnormal results are displayed) Labs Reviewed - No data to display RADIOLOGY ED MD interpretation: CT of the head, cervical spine, and x-ray of the thoracic spine did not show any evidence of acute abnormalities.  These images were independently assessed by me.  Patient does have significant multilevel degenerative changes of the cervical spine with moderate to severe canal stenosis at C5-C6, C6-C7, C3-C4, and C7-T1 -Agree with radiology assessment Official radiology report(s): No results found. PROCEDURES: Critical Care performed: No Procedures MEDICATIONS ORDERED IN ED: Medications  cyclobenzaprine (FLEXERIL) tablet 5 mg (5 mg Oral Given 05/15/22 0900)  acetaminophen (TYLENOL) tablet 650 mg (650 mg Oral Given 05/15/22 0859)   IMPRESSION / MDM / ASSESSMENT AND PLAN / ED COURSE  I reviewed the triage vital signs and the nursing notes.                             The patient is on the cardiac monitor to evaluate for evidence of arrhythmia and/or significant heart rate changes. Patient's presentation is most  consistent with acute presentation with potential threat to life or bodily function. Presents with sensation of tingling to left upper extremity.  Patients symptoms and work-up not consistent with a stroke and therefore they will be discharged from the ED.  Patient has currently been stabilized in the emergency department.  Patient received an head CT that was negative for stroke.  CT of the cervical spine did show evidence of severe spinal canal stenosis as well as neuroforaminal stenosis likely causing patient's persistent paresthesias Rx: Flexeril Follow-up: Neurosurgery Patients symptoms not typical for other emergent causes such as dissection, infection, DKA, trauma. Patient will be discharged with strict return precautions  and follow up with primary MD within 24 hours for further evaluation.   FINAL CLINICAL IMPRESSION(S) / ED DIAGNOSES   Final diagnoses:  Acute midline thoracic back pain  Arm paresthesia, left  Paresthesia of left leg  Neck pain   Rx / DC Orders   ED Discharge Orders          Ordered    cyclobenzaprine (FLEXERIL) 10 MG tablet  3 times daily PRN        05/15/22 1020           Note:  This document was prepared using Dragon voice recognition software and may include unintentional dictation errors.   Naaman Plummer, MD 05/15/22 1023

## 2022-05-15 NOTE — ED Triage Notes (Signed)
Patient reports last night began having upper, mid back pain and severe headache. Also c/o worsening numbness in left hand.  Reports stroke in past.

## 2022-05-15 NOTE — Discharge Instructions (Addendum)
Please use ibuprofen (Motrin) up to 800 mg every 8 hours, naproxen (Naprosyn) up to 500 mg every 12 hours, and/or acetaminophen (Tylenol) up to 4 g/day for any continued pain.  Please do not use this medication regimen for longer than 7 days

## 2022-05-16 ENCOUNTER — Telehealth: Payer: Self-pay | Admitting: Neurosurgery

## 2022-05-16 NOTE — Telephone Encounter (Signed)
-----   Message from Peggyann Shoals sent at 05/16/2022 11:28 AM EST ----- Regarding: ED fu appt Meade Maw, MD  Peggyann Shoals Schedule with stacy or danielle please     ----- Message ----- From: Naaman Plummer, MD Sent: 05/15/2022   2:33 PM EST To: Meade Maw, MD

## 2022-05-16 NOTE — Telephone Encounter (Signed)
Tried contacting patient to schedule an ED fu for back pain. I was unable to leave message due to voice mail not being set up.

## 2022-05-17 ENCOUNTER — Telehealth: Payer: Self-pay | Admitting: *Deleted

## 2022-05-17 NOTE — Transitions of Care (Post Inpatient/ED Visit) (Signed)
   05/17/2022  Name: Paul Ingram MRN: [id number] DOB: 11/02/1955  Today's TOC FU Call Status: Today's TOC FU Call Status:: Unsuccessul Call (1st Attempt) Unsuccessful Call (1st Attempt) Date: 05/17/22  Attempted to reach the patient regarding the most recent Inpatient/ED visit.  Follow Up Plan: Additional outreach attempts will be made to reach the patient to complete the Transitions of Care (Post Inpatient/ED visit) call.   [facility] [phone]

## 2022-05-18 ENCOUNTER — Telehealth: Payer: Self-pay | Admitting: *Deleted

## 2022-05-18 NOTE — Transitions of Care (Post Inpatient/ED Visit) (Signed)
   05/18/2022  Name: Paul Ingram MRN: [id number] DOB: 07-11-1955  Today's TOC FU Call Status: Today's TOC FU Call Status:: Unsuccessful Call (2nd Attempt) Unsuccessful Call (2nd Attempt) Date: 05/18/22  Attempted to reach the patient regarding the most recent Inpatient/ED visit.  Follow Up Plan: Additional outreach attempts will be made to reach the patient to complete the Transitions of Care (Post Inpatient/ED visit) call.   [facility] [phone]

## 2022-05-19 ENCOUNTER — Telehealth: Payer: Self-pay | Admitting: *Deleted

## 2022-05-19 NOTE — Transitions of Care (Post Inpatient/ED Visit) (Signed)
   05/19/2022  Name: Paul Ingram MRN: [id number] DOB: [birth year]  Today's TOC FU Call Status: Today's TOC FU Call Status:: Unsuccessful Call (3rd Attempt) Unsuccessful Call (3rd Attempt) Date: 05/19/22  Attempted to reach the patient regarding the most recent Inpatient/ED visit.  Follow Up Plan: No further outreach attempts will be made at this time. We have been unable to contact the patient.  [facility] [phone]

## 2022-05-19 NOTE — Telephone Encounter (Signed)
I was unable to leave message due to voice mail not being set up.

## 2022-05-22 ENCOUNTER — Ambulatory Visit: Payer: Medicare Other | Admitting: Family Medicine

## 2022-05-22 NOTE — Telephone Encounter (Signed)
He confirmed appt with Stacy for Friday 05/26/2022
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
## 2022-05-25 ENCOUNTER — Other Ambulatory Visit: Payer: Self-pay

## 2022-05-25 ENCOUNTER — Ambulatory Visit
Admission: RE | Admit: 2022-05-25 | Discharge: 2022-05-25 | Disposition: A | Discharge status: Home / Self Care | Payer: Self-pay | Source: Ambulatory Visit | Attending: Orthopedic Surgery | Admitting: Orthopedic Surgery

## 2022-05-25 DIAGNOSIS — Z049 Encounter for examination and observation for unspecified reason: Secondary | ICD-10-CM

## 2022-05-26 ENCOUNTER — Encounter: Payer: Self-pay | Admitting: Orthopedic Surgery

## 2022-05-26 ENCOUNTER — Ambulatory Visit: Payer: Medicare Other | Admitting: Orthopedic Surgery

## 2022-05-26 VITALS — BP 130/68 | Ht 68.0 in | Wt 230.2 lb

## 2022-05-26 DIAGNOSIS — M4802 Spinal stenosis, cervical region: Secondary | ICD-10-CM

## 2022-05-26 DIAGNOSIS — M7918 Myalgia, other site: Secondary | ICD-10-CM

## 2022-05-26 DIAGNOSIS — M25512 Pain in left shoulder: Secondary | ICD-10-CM

## 2022-05-26 DIAGNOSIS — M47812 Spondylosis without myelopathy or radiculopathy, cervical region: Secondary | ICD-10-CM

## 2022-05-26 NOTE — Patient Instructions (Signed)
It was so nice to see you today. Thank you so much for coming in.    You have some wear and tear in your neck and I think this is causing your neck pain and pain in your mid back.   I think your shoulder pain is from your left shoulder.   I want to get an MRI of your neck to look into things further. We will get this approved through your insurance and [facility] will call you to schedule the appointment.   I want you to see ortho at the [facility] for your shoulder. They should call you, but you can call them at [phone].   You can take over the counter tylenol as directed on the bottle to help with pain and inflammation.   Once I have the MRI results back, we will call to schedule a follow up with me to review them.   Please do not hesitate to call if you have any questions or concerns. You can also message me in [place].   Geronimo Boot PA-C [phone]

## 2022-06-05 ENCOUNTER — Ambulatory Visit
Admission: RE | Admit: 2022-06-05 | Discharge: 2022-06-05 | Disposition: A | Discharge status: Home / Self Care | Payer: Medicare Other | Source: Ambulatory Visit | Attending: Orthopedic Surgery | Admitting: Orthopedic Surgery

## 2022-06-05 DIAGNOSIS — M4802 Spinal stenosis, cervical region: Secondary | ICD-10-CM | POA: Insufficient documentation

## 2022-06-05 DIAGNOSIS — M47812 Spondylosis without myelopathy or radiculopathy, cervical region: Secondary | ICD-10-CM | POA: Diagnosis not present

## 2022-06-05 DIAGNOSIS — R200 Anesthesia of skin: Secondary | ICD-10-CM | POA: Diagnosis not present

## 2022-06-05 DIAGNOSIS — M542 Cervicalgia: Secondary | ICD-10-CM | POA: Diagnosis not present

## 2022-06-05 DIAGNOSIS — R202 Paresthesia of skin: Secondary | ICD-10-CM | POA: Diagnosis not present

## 2022-06-12 NOTE — Progress Notes (Unsigned)
Telephone Visit- Progress Note: Referring Physician:  Delsa Grana, PA-C [address] [place],  [place] [address]  Primary Physician:  Delsa Grana, PA-C  This visit was performed via telephone.  Patient location: home Provider location: office  I spent a total of 15 minutes non-face-to-face activities for this visit on the date of this encounter including review of current clinical condition and response to treatment.    Patient has given verbal consent to this telephone visits and we reviewed the limitations of a telephone visit. Patient wishes to proceed.    Chief Complaint:  review cervical MRI results  History of Present Illness: Paul Ingram is a 67 y.o. male has a history of DM, CVA with left sided weakness, HTN, OSA, hyperlipidemia, history of GI bleed, and alcoholism.   Last seen by  me on 05/26/22 for neck and left arm pain. History of left arm weakness since his stroke. He has known thoracic spondylosis. Previous cervical CT showed multilevel moderate to severe cervical stenosis.   He was sent to ortho for his left shoulder. Phone visit scheduled to review his cervical MRI.   He continues with intermittent neck pain that radiates into left shoulder. He had bad day yesterday, not having much pain at all today. He notes weakness in left arm since stroke as above. He has tingling in left arm. He is not dropping things. No balance issues.   Has not been called by ortho yet for left shoulder.   History of GI bleed- would avoid NSAIDs.   Bowel/Bladder Dysfunction: none   Conservative measures:  Physical therapy: no Multimodal medical therapy including regular antiinflammatories: flexeril  Injections: no epidural steroid injections   Past Surgery: no spine surgery   Paul Ingram has no current symptoms of cervical myelopathy. States he is not having dexterity issues with hands now.    The symptoms are causing a significant impact on the patient's  life.  Exam: No exam done as this was a telephone encounter.     Imaging: Cervical MRI dated 06/05/22:  FINDINGS: Alignment: Straightening of the normal cervical lordosis. No antero or retrolisthesis.   Vertebrae: Chronic fusion across the C6-7 disc space. Mild degenerative endplate marrow changes without active edema. No edematous facet arthritis.   Cord: Focal gliosis within the cord, more on the right side, at the C6 level.   Posterior Fossa, vertebral arteries, paraspinal tissues: Negative   Disc levels:   The foramen magnum is widely patent. There is ordinary mild osteoarthritis of the C1-2 articulation but no encroachment upon the neural structures.   C2-3: Mild left-sided facet arthritis. No compressive canal or foraminal narrowing.   C3-4: Spondylosis with endplate osteophytes and bulging of the disc more prominent towards the left. Narrowing of the canal with AP diameter in the midline 7.5 mm. Effacement of the subarachnoid space and slight indentation of the left side of the cord. Bilateral bony foraminal narrowing could affect either C4 nerve.   C4-5: Endplate osteophytes and bulging of the disc. Narrowing of the canal with AP diameter in the midline 7.7 mm. Effacement of the ventral subarachnoid space but no compression of the cord. Bilateral bony foraminal narrowing that could possibly affect either C5 nerve.   C5-6: Endplate osteophytes and bulging of the disc. Canal narrowing with AP diameter in the midline measuring 8.4 mm. There is bony foraminal stenosis, right more than left, which could affect either C6 nerve. It is not clear why there is gliosis in the  cord at the C6 level below the disc space. This does not appear to line up with either the C5-6 disc level or the fused C6-7 disc level.   C6-7: Chronic fusion across the disc space. AP diameter of the canal in the midline 7.5 mm. Chronic bilateral bony foraminal stenosis.   C7-T1: Endplate  osteophytes and bulging of the disc. No compressive central canal stenosis. Bilateral facet osteoarthritis without edematous change. Bilateral foraminal stenosis could affect either C8 nerve.   IMPRESSION: 1. Chronic fusion across the C6-7 disc space. Chronic bilateral bony foraminal stenosis at this level. 2. Chronic degenerative spondylosis at C3-4, C4-5 and C5-6. Canal narrowing with AP diameter of the canal as narrow as 7.5 mm at C3-4 and 7.7 mm at C4-5. Effacement of the subarachnoid space and slight indentation of the left side of the cord at C3-4. Bilateral foraminal stenosis at those levels that could affect the C4, C5 and C6 nerves. 3. Cord gliosis of unknown etiology at the C6 level below the disc space level. This does not appear to line up with either the C5-6 disc level or the fused C6-7 disc level. 4. C7-T1 spondylosis and facet osteoarthritis with bilateral foraminal stenosis that could affect either C8 nerve.     Electronically Signed   By: Nelson Chimes M.D.   On: 06/07/2022 09:23  I have personally reviewed the images and agree with the above interpretation.  Above MRI also reviewed with Dr. Izora Ribas prior to this visit. 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   Assessment and Plan: [name] is a pleasant 67 y.o. male with intermittent neck pain that radiates into his left arm.  He's had weakness in left arm since his stroke.   MRI shows multilevel cervical spondylosis with multilevel moderate to severe spinal stenosis. 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   At visit today, he states he has tingling in left hand, but no dexterity issues. No balance issues.   Treatment options discussed with patient. Following plan made with Dr. Izora Ribas:    - Reviewed signs/symptoms of cervical myelopathy. Will call if he develops these.  - PT for cervical spine. Orders to [facility] [address]  - Referral done to ortho at last visit for left shoulder pain. They should be  calling him.  - Follow up with me in 6 weeks for recheck.   Geronimo Boot PA-C Neurosurgery

## 2022-06-13 ENCOUNTER — Ambulatory Visit (INDEPENDENT_AMBULATORY_CARE_PROVIDER_SITE_OTHER): Payer: Medicare Other | Admitting: Orthopedic Surgery

## 2022-06-13 ENCOUNTER — Encounter: Payer: Self-pay | Admitting: Orthopedic Surgery

## 2022-06-13 DIAGNOSIS — M4802 Spinal stenosis, cervical region: Secondary | ICD-10-CM

## 2022-06-13 DIAGNOSIS — M47812 Spondylosis without myelopathy or radiculopathy, cervical region: Secondary | ICD-10-CM | POA: Diagnosis not present

## 2022-06-13 DIAGNOSIS — M25512 Pain in left shoulder: Secondary | ICD-10-CM

## 2022-06-13 DIAGNOSIS — M5412 Radiculopathy, cervical region: Secondary | ICD-10-CM | POA: Diagnosis not present

## 2022-06-23 DIAGNOSIS — M79601 Pain in right arm: Secondary | ICD-10-CM | POA: Diagnosis not present

## 2022-06-23 DIAGNOSIS — M542 Cervicalgia: Secondary | ICD-10-CM | POA: Diagnosis not present

## 2022-06-23 DIAGNOSIS — M79602 Pain in left arm: Secondary | ICD-10-CM | POA: Diagnosis not present

## 2022-06-26 DIAGNOSIS — M79601 Pain in right arm: Secondary | ICD-10-CM | POA: Diagnosis not present

## 2022-06-26 DIAGNOSIS — M79602 Pain in left arm: Secondary | ICD-10-CM | POA: Diagnosis not present

## 2022-06-26 DIAGNOSIS — M542 Cervicalgia: Secondary | ICD-10-CM | POA: Diagnosis not present

## 2022-07-05 DIAGNOSIS — M79601 Pain in right arm: Secondary | ICD-10-CM | POA: Diagnosis not present

## 2022-07-05 DIAGNOSIS — M542 Cervicalgia: Secondary | ICD-10-CM | POA: Diagnosis not present

## 2022-07-05 DIAGNOSIS — M79602 Pain in left arm: Secondary | ICD-10-CM | POA: Diagnosis not present

## 2022-07-14 DIAGNOSIS — M79601 Pain in right arm: Secondary | ICD-10-CM | POA: Diagnosis not present

## 2022-07-14 DIAGNOSIS — M542 Cervicalgia: Secondary | ICD-10-CM | POA: Diagnosis not present

## 2022-07-14 DIAGNOSIS — M79602 Pain in left arm: Secondary | ICD-10-CM | POA: Diagnosis not present

## 2022-07-17 DIAGNOSIS — M79601 Pain in right arm: Secondary | ICD-10-CM | POA: Diagnosis not present

## 2022-07-17 DIAGNOSIS — M79602 Pain in left arm: Secondary | ICD-10-CM | POA: Diagnosis not present

## 2022-07-17 DIAGNOSIS — M542 Cervicalgia: Secondary | ICD-10-CM | POA: Diagnosis not present

## 2022-07-19 DIAGNOSIS — M542 Cervicalgia: Secondary | ICD-10-CM | POA: Diagnosis not present

## 2022-07-19 DIAGNOSIS — M79601 Pain in right arm: Secondary | ICD-10-CM | POA: Diagnosis not present

## 2022-07-19 DIAGNOSIS — M79602 Pain in left arm: Secondary | ICD-10-CM | POA: Diagnosis not present

## 2022-07-24 DIAGNOSIS — M542 Cervicalgia: Secondary | ICD-10-CM | POA: Diagnosis not present

## 2022-07-24 DIAGNOSIS — M79601 Pain in right arm: Secondary | ICD-10-CM | POA: Diagnosis not present

## 2022-07-24 DIAGNOSIS — M79602 Pain in left arm: Secondary | ICD-10-CM | POA: Diagnosis not present

## 2022-07-25 ENCOUNTER — Ambulatory Visit: Payer: Medicare Other | Admitting: Orthopedic Surgery

## 2022-07-26 DIAGNOSIS — M79601 Pain in right arm: Secondary | ICD-10-CM | POA: Diagnosis not present

## 2022-07-26 DIAGNOSIS — M79602 Pain in left arm: Secondary | ICD-10-CM | POA: Diagnosis not present

## 2022-07-26 DIAGNOSIS — M542 Cervicalgia: Secondary | ICD-10-CM | POA: Diagnosis not present
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
## 2022-08-02 ENCOUNTER — Ambulatory Visit: Payer: Medicare Other | Admitting: Orthopedic Surgery

## 2022-08-02 ENCOUNTER — Encounter: Payer: Self-pay | Admitting: Orthopedic Surgery

## 2022-08-02 VITALS — BP 126/72 | Ht 69.0 in | Wt 230.2 lb

## 2022-08-02 DIAGNOSIS — M47812 Spondylosis without myelopathy or radiculopathy, cervical region: Secondary | ICD-10-CM | POA: Diagnosis not present

## 2022-08-02 DIAGNOSIS — M4802 Spinal stenosis, cervical region: Secondary | ICD-10-CM | POA: Diagnosis not present

## 2022-08-14 ENCOUNTER — Encounter: Payer: Self-pay | Admitting: Physician Assistant

## 2022-08-14 ENCOUNTER — Ambulatory Visit (INDEPENDENT_AMBULATORY_CARE_PROVIDER_SITE_OTHER): Payer: Medicare Other | Admitting: Physician Assistant

## 2022-08-14 VITALS — BP 118/74 | HR 96 | Temp 98.4°F | Resp 18 | Ht 68.0 in | Wt 228.3 lb

## 2022-08-14 DIAGNOSIS — M545 Low back pain, unspecified: Secondary | ICD-10-CM

## 2022-08-14 MED ORDER — CYCLOBENZAPRINE HCL 10 MG PO TABS: 10.0000 mg | ORAL_TABLET | Freq: Three times a day (TID) | ORAL | 0 refills | Status: DC | PRN

## 2022-08-14 NOTE — Progress Notes (Signed)
Acute Office Visit   Patient: Paul Ingram   DOB: 19-Jul-1955   67 y.o. Male  MRN: [id number] Visit Date: 08/14/2022  Today's healthcare provider: Oswaldo Conroy Gaylynn Seiple, PA-C  Introduced myself to the patient as a Secondary school teacher and provided education on APPs in clinical practice.    Chief Complaint  Patient presents with   Back Pain    Started this am   Subjective    HPI HPI     Back Pain    Additional comments: Started this am      Last edited by [name], CMA on 08/14/2022 10:58 AM.       Back pain   Onset: sudden  Duration: states it started some time last week He doesn't think he's done anything to hurt it. Denies trauma, falls.  Location: low midline of back  Radiation: none to buttocks or legs Pain level and character: right now its 7/10 but was worse a few days ago.  Other associated symptoms: he denies numbness or tingling after the pain started.  He denies saddle anesthesia, loss of control of bowel or bladder.  He reports a history of arthritis in his back  Interventions: Tylenol PM Alleviating: hot compresses provides some relief Aggravating: reports pain is worse after laying down or sleeping and he tries to get up and get going       Medications: Outpatient Medications Prior to Visit  Medication Sig   amLODipine-valsartan (EXFORGE) 10-160 MG tablet Take 1 tablet by mouth daily.   atorvastatin (LIPITOR) 40 MG tablet Take 1 tablet (40 mg total) by mouth at bedtime.   Cyanocobalamin (VITAMIN B-12) 1000 MCG SUBL Place 1 tablet (1,000 mcg total) under the tongue daily at 12 noon.   empagliflozin (JARDIANCE) 25 MG TABS tablet Take 1 tablet (25 mg total) by mouth daily before breakfast.   pantoprazole (PROTONIX) 40 MG tablet Take 1 tablet (40 mg total) by mouth daily.   No facility-administered medications prior to visit.    Review of Systems  Musculoskeletal:  Positive for back pain. Negative for gait problem.  Neurological:  Negative for tremors,  weakness, numbness and headaches.       Objective    Ht 5\' 8"  (1.727 m)   BMI 35.00 kg/m    Physical Exam Vitals reviewed.  Constitutional:      General: He is awake.     Appearance: Normal appearance. He is well-developed and well-groomed.  HENT:     Head: Normocephalic and atraumatic.  Pulmonary:     Effort: Pulmonary effort is normal.  Musculoskeletal:     Cervical back: Normal range of motion. Normal range of motion.     Comments: No midline tenderness to palpation or obvious step offs ROM is overall intact but forward flexion at the hips and extension is painful per patient  ROM at hips is intact with abduction, flexion and adduction.  Thoracic ROM is intact but he reports mild pulling with lateral rotation   Neurological:     General: No focal deficit present.     Mental Status: He is alert and oriented to person, place, and time.     GCS: GCS eye subscore is 4. GCS verbal subscore is 5. GCS motor subscore is 6.     Cranial Nerves: No dysarthria or facial asymmetry.     Motor: Motor function is intact. No weakness.     Coordination: Coordination is intact.     Gait: Gait  is intact.  Psychiatric:        Attention and Perception: Attention normal.        Mood and Affect: Mood normal.        Speech: Speech normal.        Behavior: Behavior normal. Behavior is cooperative.       No results found for any visits on 08/14/22.  Assessment & Plan      No follow-ups on file.       Problem List Items Addressed This Visit   None Visit Diagnoses     Acute midline low back pain without sciatica    -  Primary Acute, new concern Patient reports lower bilateral and midline low back pain that started last week and is not improving with home measures PE was overall normal with some pain during ROM review Recommend conservative measures to start: Tylenol for pain relief, can also use Lidocaine patches and Voltaren gel, warm compresses, gentle massage and stretches as  tolerated, Stretches provided in AVS Will also provide script of Flexeril per patient request - 30 tablets for acute PRN usage- reviewed side effects with him Reviewed that if symptoms are not improving or getting worse he should return to office for further evaluation  Follow up as needed for persistent or progressing symptoms     Relevant Medications   cyclobenzaprine (FLEXERIL) 10 MG tablet        No follow-ups on file.   I, Malone Vanblarcom E Kyshawn Teal, PA-C, have reviewed all documentation for this visit. The documentation on 08/14/22 for the exam, diagnosis, procedures, and orders are all accurate and complete.   Jacquelin Hawking, MHS, PA-C [facility]

## 2022-08-14 NOTE — Patient Instructions (Addendum)
Based on your symptoms and physical exam I believe the following is the cause of your concern today Back pain likely secondary to a strain of your back muscles  I recommend the following at this time to help relieve that discomfort:  Rest Warm compresses to the area (20 minutes on, minimum of 30 minutes off) You can take Tylenol to help with pain  I have sent in a script for a muscle relaxer called Cyclobenzaprine to help with pain and sleep. It can make you drowsy so do not use when you need to remain alert.   You can use Lidocaine patches and Voltaren gel to help with pain as well.  Gentle stretches and exercises that I have included in your paperwork Try to reduce excess strain to the area and rest as much as possible  Wear supportive shoes and, if you must lift anything, use proper lifting techniques that spare your back.   If these measures do not lead to improvement in your symptoms over the next 2-4 weeks please let us know

## 2022-10-15 ENCOUNTER — Other Ambulatory Visit: Payer: Self-pay | Admitting: Internal Medicine

## 2022-10-15 DIAGNOSIS — E782 Mixed hyperlipidemia: Secondary | ICD-10-CM

## 2022-10-15 DIAGNOSIS — I10 Essential (primary) hypertension: Secondary | ICD-10-CM

## 2022-10-15 DIAGNOSIS — E1159 Type 2 diabetes mellitus with other circulatory complications: Secondary | ICD-10-CM

## 2022-10-15 DIAGNOSIS — K219 Gastro-esophageal reflux disease without esophagitis: Secondary | ICD-10-CM

## 2022-10-16 NOTE — Telephone Encounter (Signed)
Requested Prescriptions  Pending Prescriptions Disp Refills   pantoprazole (PROTONIX) 40 MG tablet [Pharmacy Med Name: PANTOPRAZOLE SOD DR 40 MG TAB] 90 tablet 0    Sig: TAKE 1 TABLET BY MOUTH EVERY DAY     Gastroenterology: Proton Pump Inhibitors Passed - 10/15/2022  1:50 AM      Passed - Valid encounter within last 12 months    Recent Outpatient Visits           2 months ago Acute midline low back pain without sciatica   [place] [facility] Mecum, Erin E, PA-C   6 months ago Type 2 diabetes mellitus with other circulatory complication, without long-term current use of insulin [facility])   [place] [facility] Margarita Mail, DO   11 months ago Hypertension goal BP (blood pressure) < 140/90   [facility] Danelle Berry, PA-C   1 year ago Annual physical exam   [facility] Danelle Berry, PA-C   1 year ago Dyslipidemia associated with type 2 diabetes mellitus [facility])   [place] [facility] [place], Danna Hefty, MD               atorvastatin (LIPITOR) 40 MG tablet [Pharmacy Med Name: ATORVASTATIN 40 MG TABLET] 90 tablet 0    Sig: TAKE 1 TABLET BY MOUTH EVERYDAY AT BEDTIME     Cardiovascular:  Antilipid - Statins Failed - 10/15/2022  1:50 AM      Failed - Lipid Panel in normal range within the last 12 months    Cholesterol  Date Value Ref Range Status  10/07/2021 247 (H) <200 mg/dL Final   LDL Cholesterol (Calc)  Date Value Ref Range Status  10/07/2021 158 (H) mg/dL (calc) Final    Comment:    Reference range: <100 . Desirable range <100 mg/dL for primary prevention;   <70 mg/dL for patients with CHD or diabetic patients  with > or = 2 CHD risk factors. Marland Kitchen LDL-C is now calculated using the Martin-Hopkins  calculation, which is a validated novel method providing  better accuracy than the Friedewald equation in the  estimation of LDL-C.  Horald Pollen et al. Lenox Ahr. 7829;562(13):  2061-2068  (http://education.[employer].com/faq/FAQ164)    HDL  Date Value Ref Range Status  10/07/2021 62 > OR = 40 mg/dL Final   Triglycerides  Date Value Ref Range Status  10/07/2021 143 <150 mg/dL Final         Passed - Patient is not pregnant      Passed - Valid encounter within last 12 months    Recent Outpatient Visits           2 months ago Acute midline low back pain without sciatica   [place] [facility] Mecum, Erin E, PA-C   6 months ago Type 2 diabetes mellitus with other circulatory complication, without long-term current use of insulin [facility])   [place] [facility] Margarita Mail, DO   11 months ago Hypertension goal BP (blood pressure) < 140/90   [facility] Danelle Berry, PA-C   1 year ago Annual physical exam   [facility] Danelle Berry, PA-C   1 year ago Dyslipidemia associated with type 2 diabetes mellitus [facility])    [facility] Carlynn Purl, Danna Hefty, MD               amLODipine-valsartan (EXFORGE) 10-160 MG tablet [Pharmacy Med Name: AMLODIPINE-VALSARTAN 10-160 MG] 90 tablet 0  Sig: TAKE 1 TABLET BY MOUTH EVERY DAY     Cardiovascular: CCB + ARB Combos Failed - 10/15/2022  1:50 AM      Failed - K in normal range and within 180 days    Potassium  Date Value Ref Range Status  10/07/2021 4.0 3.5 - 5.3 mmol/L Final         Failed - Cr in normal range and within 180 days    Creat  Date Value Ref Range Status  10/07/2021 0.86 0.70 - 1.35 mg/dL Final   Creatinine, Urine  Date Value Ref Range Status  10/07/2021 88 20 - 320 mg/dL Final         Failed - Na in normal range and within 180 days    Sodium  Date Value Ref Range Status  10/07/2021 141 135 - 146 mmol/L Final         Passed - Patient is not pregnant      Passed - Last BP in normal range    BP Readings from Last 1 Encounters:  08/14/22 118/74         Passed - Valid  encounter within last 6 months    Recent Outpatient Visits           2 months ago Acute midline low back pain without sciatica   [place] [facility] Mecum, Erin E, PA-C   6 months ago Type 2 diabetes mellitus with other circulatory complication, without long-term current use of insulin [facility])   [place] [facility] Margarita Mail, DO   11 months ago Hypertension goal BP (blood pressure) < 140/90   [facility] Danelle Berry, PA-C   1 year ago Annual physical exam   [facility] Danelle Berry, PA-C   1 year ago Dyslipidemia associated with type 2 diabetes mellitus [facility])   [facility] Alba Cory, MD
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
## 2022-10-25 ENCOUNTER — Encounter: Payer: Self-pay | Admitting: Orthopedic Surgery

## 2022-10-25 ENCOUNTER — Ambulatory Visit: Payer: Medicare Other | Admitting: Orthopedic Surgery

## 2022-10-25 VITALS — BP 136/82 | Ht 68.0 in | Wt 230.0 lb

## 2022-10-25 DIAGNOSIS — R200 Anesthesia of skin: Secondary | ICD-10-CM

## 2022-10-25 DIAGNOSIS — R202 Paresthesia of skin: Secondary | ICD-10-CM

## 2022-10-25 DIAGNOSIS — M47812 Spondylosis without myelopathy or radiculopathy, cervical region: Secondary | ICD-10-CM | POA: Diagnosis not present

## 2022-10-25 DIAGNOSIS — M4802 Spinal stenosis, cervical region: Secondary | ICD-10-CM

## 2022-10-25 NOTE — Patient Instructions (Signed)
It was so nice to see you today. Thank you so much for coming in.    Keep an eye on the numbness and tingling in your right hand. If this gets worse, we may need to get a nerve test/EMG.   You have some wear and tear in your neck with some spinal stenosis (pressure on the spinal cord).   If you start having new weakness in your hands or problems picking up things/doing buttons or zippers then you need to let me know. If you start notice changes in your balance where you feel drunk or like you are falling a lot then you also need to let me know.   I will see you back in 6 months. Please do not hesitate to call if you have any questions or concerns. You can also message me in MyChart.   Drake Leach PA-C [phone]

## 2023-01-22 DIAGNOSIS — Z03818 Encounter for observation for suspected exposure to other biological agents ruled out: Secondary | ICD-10-CM | POA: Diagnosis not present

## 2023-01-22 DIAGNOSIS — J069 Acute upper respiratory infection, unspecified: Secondary | ICD-10-CM | POA: Diagnosis not present

## 2023-03-02 DIAGNOSIS — M545 Low back pain, unspecified: Secondary | ICD-10-CM | POA: Diagnosis not present

## 2023-03-05 ENCOUNTER — Ambulatory Visit (INDEPENDENT_AMBULATORY_CARE_PROVIDER_SITE_OTHER): Payer: Medicare Other | Admitting: Physician Assistant

## 2023-03-05 ENCOUNTER — Encounter: Payer: Self-pay | Admitting: Physician Assistant

## 2023-03-05 VITALS — BP 140/92 | HR 91 | Resp 16 | Ht 68.0 in | Wt 225.0 lb

## 2023-03-05 DIAGNOSIS — Z23 Encounter for immunization: Secondary | ICD-10-CM | POA: Diagnosis not present

## 2023-03-05 DIAGNOSIS — K219 Gastro-esophageal reflux disease without esophagitis: Secondary | ICD-10-CM | POA: Insufficient documentation

## 2023-03-05 DIAGNOSIS — E1159 Type 2 diabetes mellitus with other circulatory complications: Secondary | ICD-10-CM

## 2023-03-05 DIAGNOSIS — E785 Hyperlipidemia, unspecified: Secondary | ICD-10-CM | POA: Diagnosis not present

## 2023-03-05 DIAGNOSIS — E1169 Type 2 diabetes mellitus with other specified complication: Secondary | ICD-10-CM | POA: Diagnosis not present

## 2023-03-05 DIAGNOSIS — I10 Essential (primary) hypertension: Secondary | ICD-10-CM | POA: Diagnosis not present

## 2023-03-05 DIAGNOSIS — E119 Type 2 diabetes mellitus without complications: Secondary | ICD-10-CM | POA: Insufficient documentation

## 2023-03-05 MED ORDER — AMLODIPINE BESYLATE-VALSARTAN 10-160 MG PO TABS
1.0000 | ORAL_TABLET | Freq: Every day | ORAL | 1 refills | Status: DC
Start: 2023-03-05 — End: 2023-12-25

## 2023-03-05 MED ORDER — PANTOPRAZOLE SODIUM 40 MG PO TBEC
40.0000 mg | DELAYED_RELEASE_TABLET | Freq: Every day | ORAL | 1 refills | Status: DC
Start: 2023-03-05 — End: 2023-12-28

## 2023-03-05 MED ORDER — ATORVASTATIN CALCIUM 40 MG PO TABS: 40.0000 mg | ORAL_TABLET | Freq: Every day | ORAL | 1 refills | Status: DC

## 2023-03-05 NOTE — Assessment & Plan Note (Signed)
Chronic, historic condition Appears elevated in office today but suspect this might be mild whitecoat hypertension.  Patient reports BP measures that are in goal at home.  I have also reviewed previous outside office measures which are also within goal. He is currently taking amlodipine-valsartan 10-160 mg p.o. daily and appears to be tolerating well Continue current regimen Follow-up in 3 months or sooner if concerns arise
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
## 2023-03-05 NOTE — Assessment & Plan Note (Signed)
Chronic, historic condition Appears to be managed with atorvastatin 40 mg p.o. daily Recheck lipid panel today Results to dictate further management Continue current regimen for now Follow-up in 6 months or sooner if concerns arise

## 2023-03-05 NOTE — Assessment & Plan Note (Signed)
Chronic, historic condition Appears well-controlled with pantoprazole 40 mg p.o. daily Recommend that he continue his current regimen and avoids trigger foods Follow-up in 6 months or sooner if concerns arise

## 2023-03-05 NOTE — Assessment & Plan Note (Signed)
Chronic, historic condition Most recent A1c was 7.4% He is currently taking Jardiance 25 mg p.o. daily and appears to be tolerating well He is not checking blood glucose levels at home. Eye exam and foot exam are up-to-date.  Microalbumin ordered today.  Recheck A1c today.  Results to dictate further management Continue current regimen for now Follow-up in 3 months or sooner if concerns arise

## 2023-03-06 LAB — MICROALBUMIN / CREATININE URINE RATIO
Creatinine, Urine: 62 mg/dL (ref 20–320)
Microalb Creat Ratio: 27 mg/g{creat} (ref <30)
Microalb, Ur: 1.7 mg/dL

## 2023-03-06 LAB — LIPID PANEL
Cholesterol: 170 mg/dL (ref <200)
HDL: 63 mg/dL (ref >=40)
LDL Cholesterol (Calc): 91 mg/dL
Non-HDL Cholesterol (Calc): 107 mg/dL (ref <130)
Total CHOL/HDL Ratio: 2.7 (calc) (ref <5.0)
Triglycerides: 70 mg/dL (ref <150)

## 2023-03-06 LAB — COMPREHENSIVE METABOLIC PANEL
AG Ratio: 1.1 (calc) (ref 1.0–2.5)
ALT: 31 U/L (ref 9–46)
AST: 66 U/L — ABNORMAL HIGH (ref 10–35)
Albumin: 4.1 g/dL (ref 3.6–5.1)
Alkaline phosphatase (APISO): 59 U/L (ref 35–144)
BUN/Creatinine Ratio: 8 (calc) (ref 6–22)
BUN: 6 mg/dL — ABNORMAL LOW (ref 7–25)
CO2: 31 mmol/L (ref 20–32)
Calcium: 9.4 mg/dL (ref 8.6–10.3)
Chloride: 101 mmol/L (ref 98–110)
Creat: 0.79 mg/dL (ref 0.70–1.35)
Globulin: 3.7 g/dL (ref 1.9–3.7)
Glucose, Bld: 144 mg/dL — ABNORMAL HIGH (ref 65–99)
Potassium: 3.5 mmol/L (ref 3.5–5.3)
Sodium: 142 mmol/L (ref 135–146)
Total Bilirubin: 0.9 mg/dL (ref 0.2–1.2)
Total Protein: 7.8 g/dL (ref 6.1–8.1)

## 2023-03-06 LAB — HEMOGLOBIN A1C
Hgb A1c MFr Bld: 8.3 %{Hb} — ABNORMAL HIGH (ref <5.7)
Mean Plasma Glucose: 192 mg/dL
eAG (mmol/L): 10.6 mmol/L

## 2023-03-06 LAB — CBC WITH DIFFERENTIAL/PLATELET
Absolute Lymphocytes: 1837 {cells}/uL (ref 850–3900)
Absolute Monocytes: 1009 {cells}/uL — ABNORMAL HIGH (ref 200–950)
Basophils Absolute: 49 {cells}/uL (ref 0–200)
Basophils Relative: 0.6 %
Eosinophils Absolute: 98 {cells}/uL (ref 15–500)
Eosinophils Relative: 1.2 %
HCT: 42.1 % (ref 38.5–50.0)
Hemoglobin: 14.3 g/dL (ref 13.2–17.1)
MCH: 31.4 pg (ref 27.0–33.0)
MCHC: 34 g/dL (ref 32.0–36.0)
MCV: 92.5 fL (ref 80.0–100.0)
MPV: 10.9 fL (ref 7.5–12.5)
Monocytes Relative: 12.3 %
Neutro Abs: 5207 {cells}/uL (ref 1500–7800)
Neutrophils Relative %: 63.5 %
Platelets: 201 10*3/uL (ref 140–400)
RBC: 4.55 10*6/uL (ref 4.20–5.80)
RDW: 13.8 % (ref 11.0–15.0)
Total Lymphocyte: 22.4 %
WBC: 8.2 10*3/uL (ref 3.8–10.8)

## 2023-03-07 NOTE — Progress Notes (Signed)
Your labs are back  Your A1c has increased to 8.3% I recommend that you schedule an office visit to discuss adding a medication to your regimen to provide more control as we prefer your A1c to be less than 7.0% Your CBC is overall normal- no signs of anemia Your electrolytes and kidney function were overall normal. There was a slightly elevated liver enzyme but your other two liver enzyme labs were in normal range. We can monitor this for now. Your cholesterol looks okay for now. Please avoid saturated fats  Your urine microalbumin was normal

## 2023-03-09 ENCOUNTER — Ambulatory Visit: Payer: Medicare Other | Admitting: Physician Assistant

## 2023-03-09 VITALS — BP 156/86 | HR 72 | Resp 16 | Ht 68.0 in | Wt 225.0 lb

## 2023-03-09 DIAGNOSIS — Z7984 Long term (current) use of oral hypoglycemic drugs: Secondary | ICD-10-CM

## 2023-03-09 DIAGNOSIS — E1159 Type 2 diabetes mellitus with other circulatory complications: Secondary | ICD-10-CM | POA: Diagnosis not present

## 2023-03-09 MED ORDER — RYBELSUS 3 MG PO TABS: 3.0000 mg | ORAL_TABLET | Freq: Every day | ORAL | 2 refills | Status: DC

## 2023-03-09 NOTE — Patient Instructions (Signed)
I am adding a medication to your regimen today called Rybelsus 3 mg to be taken once per day, at least 30 minutes before your first meal of the day  Please continue to take your Jardiance as directed.   Let us know if you have any concerning side effects or an allergic reaction

## 2023-03-09 NOTE — Assessment & Plan Note (Addendum)
Chronic, historic condition Most recent A1c was 8.3%  Currently he is only taking Jardiance 25 mg PO every day  Will add Rybelsus 3 mg PO every day to regimen for further control.  Reviewed administration, dosing, side effects and what to do in case of allergic reaction  Follow up in 3 months or sooner if concerns arise
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
## 2023-03-25 DIAGNOSIS — S4991XA Unspecified injury of right shoulder and upper arm, initial encounter: Secondary | ICD-10-CM | POA: Diagnosis not present

## 2023-03-25 DIAGNOSIS — M25511 Pain in right shoulder: Secondary | ICD-10-CM | POA: Diagnosis not present

## 2023-03-27 ENCOUNTER — Other Ambulatory Visit: Payer: Self-pay | Admitting: Internal Medicine

## 2023-03-27 DIAGNOSIS — E1159 Type 2 diabetes mellitus with other circulatory complications: Secondary | ICD-10-CM

## 2023-03-29 ENCOUNTER — Encounter: Payer: Self-pay | Admitting: Nurse Practitioner

## 2023-03-29 ENCOUNTER — Ambulatory Visit (INDEPENDENT_AMBULATORY_CARE_PROVIDER_SITE_OTHER): Payer: Medicare Other | Admitting: Nurse Practitioner

## 2023-03-29 VITALS — BP 130/70 | HR 95 | Temp 97.7°F | Resp 16 | Ht 68.0 in | Wt 225.5 lb

## 2023-03-29 DIAGNOSIS — R296 Repeated falls: Secondary | ICD-10-CM | POA: Diagnosis not present

## 2023-03-29 DIAGNOSIS — M25511 Pain in right shoulder: Secondary | ICD-10-CM | POA: Diagnosis not present

## 2023-03-29 DIAGNOSIS — R2681 Unsteadiness on feet: Secondary | ICD-10-CM | POA: Diagnosis not present

## 2023-03-29 NOTE — Progress Notes (Signed)
 BP 130/70   Pulse 95   Temp 97.7 F (36.5 C) (Oral)   Resp 16   Ht 5' 8 (1.727 m)   Wt 225 lb 8 oz (102.3 kg)   SpO2 95%   BMI 34.29 kg/m    Subjective:    Patient ID: Paul Ingram, male    DOB: [birth year], 68 y.o.   MRN: [id number]  HPI: Paul Ingram is a 68 y.o. male  Chief Complaint  Patient presents with   Shoulder Pain    Right shoulder, pt had a fall this past Saturday pt [facility] on "Sunday they did x-ray but normal    Discussed the use of AI scribe software for clinical note transcription with the patient, who gave verbal consent to proceed.  History of Present Illness   The patient, with a history of stroke, presents with right shoulder pain following a fall from his porch. The patient reports landing on his shoulder during the fall. The pain is located in the front of the shoulder and is exacerbated by certain movements. The patient has been managing the pain with Tylenol.  The patient's companion reports that the patient has been unsteady for a while and has had multiple falls, including one at [employer]. The patient confirms feeling unsteady on his feet, but denies experiencing dizziness.       12" /11/2022    9:42 AM 08/14/2022   11:03 AM 04/14/2022    8:48 AM  Depression screen PHQ 2/9  Decreased Interest 0 0 0  Down, Depressed, Hopeless 0 0 0  PHQ - 2 Score 0 0 0  Altered sleeping 0 0 0  Tired, decreased energy 0 0 0  Change in appetite 0 0 0  Feeling bad or failure about yourself  0 0 0  Trouble concentrating 0 0 0  Moving slowly or fidgety/restless 0 0 0  Suicidal thoughts 0 0 0  PHQ-9 Score 0 0 0  Difficult doing work/chores  Not difficult at all Not difficult at all    Relevant past medical, surgical, family and social history reviewed and updated as indicated. Interim medical history since our last visit reviewed. Allergies and medications reviewed and updated.  Review of Systems  Ten systems reviewed and is negative except as  mentioned in HPI      Objective:    BP 130/70   Pulse 95   Temp 97.7 F (36.5 C) (Oral)   Resp 16   Ht 5' 8 (1.727 m)   Wt 225 lb 8 oz (102.3 kg)   SpO2 95%   BMI 34.29 kg/m    Wt Readings from Last 3 Encounters:  03/29/23 225 lb 8 oz (102.3 kg)  03/09/23 225 lb (102.1 kg)  03/05/23 225 lb (102.1 kg)    Physical Exam  Constitutional: Patient appears well-developed and well-nourished. Obese  No distress.  HEENT: head atraumatic, normocephalic, pupils equal and reactive to light, neck supple Cardiovascular: Normal rate, regular rhythm and normal heart sounds.  No murmur heard. No BLE edema. Pulmonary/Chest: Effort normal and breath sounds normal. No respiratory distress. Abdominal: Soft.  There is no tenderness. MSK: right shoulder decrease range of motion Neuro: unstable gait,  equal grip, no arm drift Psychiatric: Patient has a normal mood and affect. behavior is normal. Judgment and thought content normal.  Results for orders placed or performed in visit on 03/05/23  HgB A1c   Collection Time: 03/05/23 10:28 AM  Result Value Ref Range  Hgb A1c MFr Bld 8.3 (H) <5.7 % of total Hgb   Mean Plasma Glucose 192 mg/dL   eAG (mmol/L) 89.3 mmol/L  Urine Microalbumin w/creat. ratio   Collection Time: 03/05/23 10:28 AM  Result Value Ref Range   Creatinine, Urine 62 20 - 320 mg/dL   Microalb, Ur 1.7 mg/dL   Microalb Creat Ratio 27 <30 mg/g creat  Lipid Profile   Collection Time: 03/05/23 10:28 AM  Result Value Ref Range   Cholesterol 170 <200 mg/dL   HDL 63 > OR = 40 mg/dL   Triglycerides 70 <849 mg/dL   LDL Cholesterol (Calc) 91 mg/dL (calc)   Total CHOL/HDL Ratio 2.7 <5.0 (calc)   Non-HDL Cholesterol (Calc) 107 <130 mg/dL (calc)  CBC w/Diff/Platelet   Collection Time: 03/05/23 10:28 AM  Result Value Ref Range   WBC 8.2 3.8 - 10.8 Thousand/uL   RBC 4.55 4.20 - 5.80 Million/uL   Hemoglobin 14.3 13.2 - 17.1 g/dL   HCT 57.8 61.4 - 49.9 %   MCV 92.5 80.0 - 100.0 fL    MCH 31.4 27.0 - 33.0 pg   MCHC 34.0 32.0 - 36.0 g/dL   RDW 86.1 88.9 - 84.9 %   Platelets 201 140 - 400 Thousand/uL   MPV 10.9 7.5 - 12.5 fL   Neutro Abs 5,207 1,500 - 7,800 cells/uL   Absolute Lymphocytes 1,837 850 - 3,900 cells/uL   Absolute Monocytes 1,009 (H) 200 - 950 cells/uL   Eosinophils Absolute 98 15 - 500 cells/uL   Basophils Absolute 49 0 - 200 cells/uL   Neutrophils Relative % 63.5 %   Total Lymphocyte 22.4 %   Monocytes Relative 12.3 %   Eosinophils Relative 1.2 %   Basophils Relative 0.6 %  Comprehensive metabolic panel   Collection Time: 03/05/23 10:28 AM  Result Value Ref Range   Glucose, Bld 144 (H) 65 - 99 mg/dL   BUN 6 (L) 7 - 25 mg/dL   Creat 9.20 9.29 - 8.64 mg/dL   BUN/Creatinine Ratio 8 6 - 22 (calc)   Sodium 142 135 - 146 mmol/L   Potassium 3.5 3.5 - 5.3 mmol/L   Chloride 101 98 - 110 mmol/L   CO2 31 20 - 32 mmol/L   Calcium  9.4 8.6 - 10.3 mg/dL   Total Protein 7.8 6.1 - 8.1 g/dL   Albumin 4.1 3.6 - 5.1 g/dL   Globulin 3.7 1.9 - 3.7 g/dL (calc)   AG Ratio 1.1 1.0 - 2.5 (calc)   Total Bilirubin 0.9 0.2 - 1.2 mg/dL   Alkaline phosphatase (APISO) 59 35 - 144 U/L   AST 66 (H) 10 - 35 U/L   ALT 31 9 - 46 U/L       Assessment & Plan:   Problem List Items Addressed This Visit   None Visit Diagnoses       Acute pain of right shoulder    -  Primary   Relevant Orders   Ambulatory referral to Orthopedic Surgery     Unsteady gait       Relevant Orders   Ambulatory referral to Neurology     Frequent falls       Relevant Orders   Ambulatory referral to Neurology        Assessment and Plan    Right Shoulder Pain Pain and limited range of motion following a fall. Suspected rotator cuff injury based on clinical presentation. -Refer to orthopedics for further evaluation and management.  Recurrent Falls Multiple falls reported, including  one at home and one at a store. No loss of consciousness reported. Patient appears unsteady on feet during  examination. -Refer to neurology for evaluation of balance issues and fall risk. -Encourage patient to use assistive devices as needed for stability.  Past Medical History of Stroke Patient has a history of stroke and is currently not under the care of a neurologist. -Reestablish care with Dr. Maree, a neurologist, for ongoing management of stroke risk factors and symptoms.  Pain Management Patient reports using Tylenol  for shoulder pain with limited relief. -Advise patient to continue Tylenol  as needed for pain. -Consider stronger pain management options if pain worsens or becomes unmanageable before orthopedic appointment.        Follow up plan: Return if symptoms worsen or fail to improve.

## 2023-03-29 NOTE — Patient Instructions (Addendum)
 [facility] Address: [address] [place], [place] [address] Phone: [phone]  [facility] neurology- Dr. Sherryll Burger [address] [place], [place] [address]  Office [phone]

## 2023-03-30 DIAGNOSIS — M25511 Pain in right shoulder: Secondary | ICD-10-CM | POA: Diagnosis not present

## 2023-03-30 DIAGNOSIS — M6281 Muscle weakness (generalized): Secondary | ICD-10-CM | POA: Diagnosis not present

## 2023-03-31 NOTE — Telephone Encounter (Signed)
 Requested medication (s) are due for refill today: Yes  Requested medication (s) are on the active medication list: Yes  Last refill:  04/14/22, #90, 2RF  Future visit scheduled: Yes  Notes to clinic:  Unable to refill per protocol due to failed labs, no updated results.      Requested Prescriptions  Pending Prescriptions Disp Refills   JARDIANCE  25 MG TABS tablet [Pharmacy Med Name: JARDIANCE  25 MG TABLET] 90 tablet 1    Sig: TAKE 1 TABLET BY MOUTH DAILY BEFORE BREAKFAST.     Endocrinology:  Diabetes - SGLT2 Inhibitors Failed - 03/31/2023 11:13 AM      Failed - HBA1C is between 0 and 7.9 and within 180 days    Hgb A1c MFr Bld  Date Value Ref Range Status  03/05/2023 8.3 (H) <5.7 % of total Hgb Final    Comment:    For someone without known diabetes, a hemoglobin A1c value of 6.5% or greater indicates that they may have  diabetes and this should be confirmed with a follow-up  test. . For someone with known diabetes, a value <7% indicates  that their diabetes is well controlled and a value  greater than or equal to 7% indicates suboptimal  control. A1c targets should be individualized based on  duration of diabetes, age, comorbid conditions, and  other considerations. . Currently, no consensus exists regarding use of hemoglobin A1c for diagnosis of diabetes for children. .          Failed - eGFR in normal range and within 360 days    GFR, Est African American  Date Value Ref Range Status  03/12/2020 94 > OR = 60 mL/min/1.81m2 Final   GFR, Est Non African American  Date Value Ref Range Status  03/12/2020 81 > OR = 60 mL/min/1.2m2 Final   GFR, Estimated  Date Value Ref Range Status  08/16/2021 >60 >60 mL/min Final    Comment:    (NOTE) Calculated using the CKD-EPI Creatinine Equation (2021)    eGFR  Date Value Ref Range Status  10/07/2021 95 > OR = 60 mL/min/1.7m2 Final    Comment:    The eGFR is based on the CKD-EPI 2021 equation. To calculate  the new  eGFR from a previous Creatinine or Cystatin C result, go to https://www.kidney.org/professionals/ kdoqi/gfr%5Fcalculator          Passed - Cr in normal range and within 360 days    Creat  Date Value Ref Range Status  03/05/2023 0.79 0.70 - 1.35 mg/dL Final   Creatinine, Urine  Date Value Ref Range Status  03/05/2023 62 20 - 320 mg/dL Final         Passed - Valid encounter within last 6 months    Recent Outpatient Visits           2 days ago Acute pain of right shoulder   [facility] Gareth Clarity F, FNP   3 weeks ago Type 2 diabetes mellitus with other circulatory complication, without long-term current use of insulin (HCC)   [facility] Mecum, Erin E, PA-C   3 weeks ago Type 2 diabetes mellitus with other circulatory complication, without long-term current use of insulin (HCC)   [facility] Mecum, Erin E, PA-C   7 months ago Acute midline low back pain without sciatica   [facility] Mecum, Erin E, PA-C   11 months ago Type 2 diabetes mellitus with other circulatory complication,  without long-term current use of insulin [facility])   [facility] Bernardo Fend, DO       Future Appointments             In 2 months Mecum, Erin E, PA-C [facility]   In 2 months Mecum, Erin E, PA-C [facility], [facility]

## 2023-04-04 DIAGNOSIS — M25511 Pain in right shoulder: Secondary | ICD-10-CM | POA: Diagnosis not present

## 2023-04-10 DIAGNOSIS — M75121 Complete rotator cuff tear or rupture of right shoulder, not specified as traumatic: Secondary | ICD-10-CM | POA: Diagnosis not present
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
## 2023-04-25 ENCOUNTER — Ambulatory Visit: Payer: Medicare Other | Admitting: Orthopedic Surgery

## 2023-04-25 DIAGNOSIS — E119 Type 2 diabetes mellitus without complications: Secondary | ICD-10-CM | POA: Diagnosis not present

## 2023-04-25 DIAGNOSIS — H2513 Age-related nuclear cataract, bilateral: Secondary | ICD-10-CM | POA: Diagnosis not present

## 2023-04-25 LAB — HM DIABETES EYE EXAM
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
## 2023-05-01 ENCOUNTER — Encounter: Payer: Medicare Other | Admitting: Physician Assistant

## 2023-05-03 ENCOUNTER — Ambulatory Visit: Payer: Medicare Other | Admitting: Orthopedic Surgery

## 2023-05-09 DIAGNOSIS — M75101 Unspecified rotator cuff tear or rupture of right shoulder, not specified as traumatic: Secondary | ICD-10-CM | POA: Diagnosis not present

## 2023-05-17 ENCOUNTER — Telehealth: Payer: Self-pay

## 2023-05-17 NOTE — Telephone Encounter (Signed)
 Patient was identified as falling into the True [place] Measure - Diabetes.   Patient was: Appointment scheduled for lab or office visit for A1c.

## 2023-05-21 DIAGNOSIS — M7541 Impingement syndrome of right shoulder: Secondary | ICD-10-CM | POA: Diagnosis not present

## 2023-05-21 DIAGNOSIS — M7521 Bicipital tendinitis, right shoulder: Secondary | ICD-10-CM | POA: Diagnosis not present

## 2023-05-21 DIAGNOSIS — M19011 Primary osteoarthritis, right shoulder: Secondary | ICD-10-CM | POA: Diagnosis not present

## 2023-05-21 DIAGNOSIS — S46211A Strain of muscle, fascia and tendon of other parts of biceps, right arm, initial encounter: Secondary | ICD-10-CM | POA: Diagnosis not present

## 2023-05-21 DIAGNOSIS — S46111A Strain of muscle, fascia and tendon of long head of biceps, right arm, initial encounter: Secondary | ICD-10-CM | POA: Diagnosis not present

## 2023-05-21 DIAGNOSIS — G8918 Other acute postprocedural pain: Secondary | ICD-10-CM | POA: Diagnosis not present

## 2023-05-21 DIAGNOSIS — S46011A Strain of muscle(s) and tendon(s) of the rotator cuff of right shoulder, initial encounter: Secondary | ICD-10-CM | POA: Diagnosis not present

## 2023-05-21 DIAGNOSIS — S43431A Superior glenoid labrum lesion of right shoulder, initial encounter: Secondary | ICD-10-CM | POA: Diagnosis not present

## 2023-05-21 DIAGNOSIS — M75121 Complete rotator cuff tear or rupture of right shoulder, not specified as traumatic: Secondary | ICD-10-CM | POA: Diagnosis not present

## 2023-05-24 ENCOUNTER — Telehealth: Payer: Self-pay

## 2023-05-24 ENCOUNTER — Telehealth: Payer: Self-pay | Admitting: Pharmacist

## 2023-05-24 ENCOUNTER — Other Ambulatory Visit: Payer: Self-pay | Admitting: Pharmacist

## 2023-05-24 DIAGNOSIS — E1169 Type 2 diabetes mellitus with other specified complication: Secondary | ICD-10-CM

## 2023-05-24 MED ORDER — ONETOUCH DELICA PLUS LANCET30G MISC: 3 refills | Status: AC

## 2023-05-24 MED ORDER — ONETOUCH VERIO VI STRP: ORAL_STRIP | 3 refills | Status: AC

## 2023-05-24 MED ORDER — ONETOUCH VERIO FLEX SYSTEM W/DEVICE KIT: PACK | 0 refills | Status: AC

## 2023-05-24 NOTE — Patient Instructions (Signed)
 Goals Addressed             This Visit's Progress    Pharmacy Goals       The goal A1c is less than 7%. This is the best way to reduce the risk of the long term complications of diabetes, including heart disease, kidney disease, eye disease, strokes, and nerve damage. An A1c of less than 7% corresponds with fasting sugars less than 130 and 2 hour after meal sugars less than 180.  Our goal bad cholesterol, or LDL, is less than 70 . This is why it is important to continue taking your atorvastatin.  Please watch the mail for an envelope from [facility] containing the patient assistance program application. Please complete this application and bring to office to have it faxed back to Attention: Linward Foster at Fax # [phone] along with a copy of your Medicare Part D prescription card and a copy of your proof of income document.  If you need to call Joni Reining, you can reach her at [phone].  Estelle Grumbles, PharmD, [facility] [phone]

## 2023-05-24 NOTE — Progress Notes (Unsigned)
 05/24/2023 Name: Paul Ingram MRN: [id number] DOB: 10/03/1955  Chief Complaint  Patient presents with   Medication Management   Medication Assistance    Patient appearing on report for True [place] Metric - Diabetes Control report due to last documented A1C of 8.3% on 03/05/2023. Next appointment with PCP is 06/05/2023.    Outreached patient to discuss diabetes control and medication management.    Subjective:  Care Team: Primary Care Provider: Danelle Berry, PA-C ; Next Scheduled Visit: 06/05/2023 Neurologist: Jolene Provost, MD; Next Scheduled Visit: 07/04/2023  Medication Access/Adherence  Current Pharmacy:  [phone], [place] - [address] [place] [address] Phone: [phone] Fax: [phone]   Patient reports affordability concerns with their medications: Yes  Patient reports access/transportation concerns to their pharmacy: No  Patient reports adherence concerns with their medications:  No    Reports has paid annual Medicare prescription deductible, but that the cost of paying multiple brand name medication copayments every month would be difficult to afford  Diabetes:  Current medications:  - Jardiance 25 mg daily - Not currently taking Rybelsus Reports started taking Rybelsus 3 mg daily as prescribed by PCP in December, but stopped after completed 1 month supply as did not receive a call from pharmacy about picking up refill Reports tolerated well  Medications tried in the past: metformin (itching)  Denies checking home blood sugar as does not currently have home monitor   Current meal patterns:  - Breakfast: sometimes skips or bowl of cereal with milk - Lunch: baked chicken or oodles of noodles - Supper: baked chicken with noodles or beans and rice - Snacks: none, sometimes popcorn - Drinks: ginger ale or water and brown liquor once daily and OJ  Reports has been working on reducing alcohol  consumption   Current physical activity: reports recently limited to movement around home since rotator cuff surgery  Statin therapy: atorvastatin 40 mg daily  Current medication access support: none   Objective:  Lab Results  Component Value Date   HGBA1C 8.3 (H) 03/05/2023    Lab Results  Component Value Date   CREATININE 0.79 03/05/2023   BUN 6 (L) 03/05/2023   NA 142 03/05/2023   K 3.5 03/05/2023   CL 101 03/05/2023   CO2 31 03/05/2023    Lab Results  Component Value Date   CHOL 170 03/05/2023   HDL 63 03/05/2023   LDLCALC 91 03/05/2023   TRIG 70 03/05/2023   CHOLHDL 2.7 03/05/2023   BP Readings from Last 3 Encounters:  03/29/23 130/70  03/09/23 (!) 156/86  03/05/23 (!) 140/92   Pulse Readings from Last 3 Encounters:  03/29/23 95  03/09/23 72  03/05/23 91     Medications Reviewed Today     Reviewed by Manuela Neptune, RPH-CPP (Pharmacist) on 05/24/23 at 1241  Med List Status: <None>   Medication Order Taking? Sig Documenting Provider Last Dose Status Informant  amLODipine-valsartan (EXFORGE) 10-160 MG tablet [id number]  Take 1 tablet by mouth daily. Mecum, Oswaldo Conroy, PA-C  Active   atorvastatin (LIPITOR) 40 MG tablet [id number]  Take 1 tablet (40 mg total) by mouth daily. Mecum, Erin E, PA-C  Active   Blood Glucose Monitoring Suppl (ONETOUCH VERIO FLEX SYSTEM) w/Device KIT [id number] Yes Use to check blood sugar up to twice daily as directed Danelle Berry, PA-C  Active   Cyanocobalamin (VITAMIN B-12) 1000 MCG SUBL [id number]  Place 1 tablet (1,000 mcg total) under the tongue  daily at 12 noon. Alba Cory, MD  Active   cyclobenzaprine (FLEXERIL) 10 MG tablet [id number]  Take 1 tablet (10 mg total) by mouth 3 (three) times daily as needed for muscle spasms. Mecum, Oswaldo Conroy, PA-C  Active   empagliflozin (JARDIANCE) 25 MG TABS tablet [id number] Yes TAKE 1 TABLET BY MOUTH DAILY BEFORE BREAKFAST. Mecum, Oswaldo Conroy, PA-C Taking Active   glucose blood (ONETOUCH VERIO)  test strip [id number] Yes Use to check blood sugar twice daily Danelle Berry, PA-C  Active   Lancets [facility] DELICA PLUS [place]) MISC [id number] Yes Use to check blood sugar twice daily Danelle Berry, PA-C  Active   pantoprazole (PROTONIX) 40 MG tablet [id number]  Take 1 tablet (40 mg total) by mouth daily. Mecum, Oswaldo Conroy, PA-C  Active   Semaglutide (RYBELSUS) 3 MG TABS [id number]  Take 1 tablet (3 mg total) by mouth daily. Mecum, Erin E, PA-C  Active               Assessment/Plan:   Diabetes: - Currently uncontrolled - Reviewed long term cardiovascular and renal outcomes of uncontrolled blood sugar - Reviewed goal A1c, goal fasting, and goal 2 hour post prandial glucose - Reviewed dietary modifications including importance of having regular well-balanced meals and snacks throughout the day, while controlling carbohydrate portion sizes             Encourage patient to avoid consumption of sugary beverages             Encourage patient to increase consumption of non-starchy vegetables  Encourage patient to limit consumption of sugary beverages  - Send prescriptions for blood glucometer and testing supplies to pharmacy for patient per protocol  Note One Touch products preferred through patient's Medicare coverage - Recommend to check glucose, keep log of results and have this record to review at upcoming medical appointments. Patient to contact provider office sooner if needed for readings outside of established parameters or symptoms - Based on reported income, patient would not meet criteria for Jardiance, or therapeutic alternative, manufacturer patient assistance - Meets financial criteria for Rybelsus patient assistance program through [employer]. Will collaborate with provider, CPhT, and patient to pursue assistance.  In meantime, patient plans to pick up 1 month supply from Rybelsus from pharmacy to restart this medication. Reminder given to take Rybelsus in the morning on an empty  stomach with up to 4 ounces of plain water only, >=30 minutes before eating food, drinking beverages, or taking other oral medications   Follow Up Plan: Clinical Pharmacist will follow up with patient by telephone on 06/27/2023 at 9:00 AM   Estelle Grumbles, PharmD, [facility] [phone]

## 2023-05-24 NOTE — Progress Notes (Signed)
   05/24/2023  Patient ID: Paul Ingram, male   DOB: May 05, 1955, 68 y.o.   MRN: [id number]  Patient appearing on report for True [place] Metric - Diabetes Control report due to last documented A1C of 8.3% on 03/05/2023. Next appointment with PCP is 06/05/2023.    Outreached patient to discuss diabetes control and medication management. Was unable to reach patient via telephone today and have left HIPAA compliant voicemail asking patient to return my call.   Estelle Grumbles, PharmD, [facility] [phone]

## 2023-05-24 NOTE — Telephone Encounter (Signed)
 Mailing out PAP Novo Nordisk (Rybelsus) and faxing provider portion today,following up in a few days.

## 2023-05-28 DIAGNOSIS — M25511 Pain in right shoulder: Secondary | ICD-10-CM | POA: Diagnosis not present

## 2023-05-28 DIAGNOSIS — R600 Localized edema: Secondary | ICD-10-CM | POA: Diagnosis not present

## 2023-06-01 DIAGNOSIS — M25511 Pain in right shoulder: Secondary | ICD-10-CM | POA: Diagnosis not present

## 2023-06-01 DIAGNOSIS — R600 Localized edema: Secondary | ICD-10-CM | POA: Diagnosis not present

## 2023-06-04 DIAGNOSIS — M25511 Pain in right shoulder: Secondary | ICD-10-CM | POA: Diagnosis not present

## 2023-06-04 DIAGNOSIS — R600 Localized edema: Secondary | ICD-10-CM | POA: Diagnosis not present

## 2023-06-05 ENCOUNTER — Telehealth: Payer: Self-pay | Admitting: Family Medicine

## 2023-06-05 ENCOUNTER — Encounter: Payer: Self-pay | Admitting: Family Medicine

## 2023-06-05 ENCOUNTER — Ambulatory Visit (INDEPENDENT_AMBULATORY_CARE_PROVIDER_SITE_OTHER): Payer: Medicare Other | Admitting: Family Medicine

## 2023-06-05 VITALS — BP 128/76 | HR 82 | Resp 16 | Ht 68.0 in | Wt 220.0 lb

## 2023-06-05 DIAGNOSIS — K219 Gastro-esophageal reflux disease without esophagitis: Secondary | ICD-10-CM

## 2023-06-05 DIAGNOSIS — E1159 Type 2 diabetes mellitus with other circulatory complications: Secondary | ICD-10-CM

## 2023-06-05 DIAGNOSIS — E1165 Type 2 diabetes mellitus with hyperglycemia: Secondary | ICD-10-CM | POA: Insufficient documentation

## 2023-06-05 DIAGNOSIS — E1169 Type 2 diabetes mellitus with other specified complication: Secondary | ICD-10-CM | POA: Diagnosis not present

## 2023-06-05 DIAGNOSIS — I69352 Hemiplegia and hemiparesis following cerebral infarction affecting left dominant side: Secondary | ICD-10-CM | POA: Diagnosis not present

## 2023-06-05 DIAGNOSIS — F1020 Alcohol dependence, uncomplicated: Secondary | ICD-10-CM

## 2023-06-05 DIAGNOSIS — E785 Hyperlipidemia, unspecified: Secondary | ICD-10-CM | POA: Diagnosis not present

## 2023-06-05 DIAGNOSIS — G4733 Obstructive sleep apnea (adult) (pediatric): Secondary | ICD-10-CM | POA: Diagnosis not present

## 2023-06-05 DIAGNOSIS — E782 Mixed hyperlipidemia: Secondary | ICD-10-CM

## 2023-06-05 DIAGNOSIS — I10 Essential (primary) hypertension: Secondary | ICD-10-CM

## 2023-06-05 DIAGNOSIS — Z8719 Personal history of other diseases of the digestive system: Secondary | ICD-10-CM

## 2023-06-05 LAB — POCT GLYCOSYLATED HEMOGLOBIN (HGB A1C): Hemoglobin A1C: 6.6 % — AB (ref 4.0–5.6)

## 2023-06-05 MED ORDER — EMPAGLIFLOZIN 25 MG PO TABS: 25.0000 mg | ORAL_TABLET | Freq: Every day | ORAL | 0 refills | Status: DC

## 2023-06-05 MED ORDER — RYBELSUS 3 MG PO TABS: 3.0000 mg | ORAL_TABLET | Freq: Every day | ORAL | 0 refills | Status: DC

## 2023-06-05 NOTE — Assessment & Plan Note (Signed)
 Prior excessive ETOH, was advised to reduce to moderate intake Flowsheet Row Office Visit from 10/07/2021 in [facility]  AUDIT-C Score 10     Need to f/up on this with MWV/CPE

## 2023-06-05 NOTE — Telephone Encounter (Signed)
 Called patient to schedule Medicare Annual Wellness Visit (AWV). Left message for patient to call back and schedule Medicare Annual Wellness Visit (AWV).  Last date of AWV: AWVI  eligible as of 01/26/2023  Please schedule an AWVI appointment at any time with [facility] VISIT.  If any questions, please contact me at [phone].    Thank you,  [facility] Direct dial  [phone]

## 2023-06-05 NOTE — Assessment & Plan Note (Signed)
 Managing HTN and HLD

## 2023-06-05 NOTE — Telephone Encounter (Signed)
 Return for MWV in 3 months .

## 2023-06-05 NOTE — Assessment & Plan Note (Signed)
 See above

## 2023-06-05 NOTE — Assessment & Plan Note (Signed)
 Not currently on cpap After recovering from surgery we'll refer him to sleep medicine to est care, get testing and CPAP
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
## 2023-06-05 NOTE — Assessment & Plan Note (Signed)
 Hx of severe GERD with GI bleed Continue PPI, avoid NSAIDs and ETOH

## 2023-06-05 NOTE — Assessment & Plan Note (Signed)
 BP controlled and at goal today on current med/doses Amlodipine-valsartan 10-160, good compliance, no SE or concerns BP Readings from Last 3 Encounters:  06/05/23 128/76  03/29/23 130/70  03/09/23 (!) 156/86

## 2023-06-05 NOTE — Patient Instructions (Signed)
 Health Maintenance  Topic Date Due   Medicare Annual Wellness Visit  Never done   Eye exam for diabetics  06/05/2023*   Zoster (Shingles) Vaccine (1 of 2) 09/04/2023*   COVID-19 Vaccine (4 - 2024-25 season) 06/25/2023   Hemoglobin A1C  09/03/2023   Yearly kidney function blood test for diabetes  03/04/2024   Yearly kidney health urinalysis for diabetes  03/04/2024   Complete foot exam   06/04/2024   Colon Cancer Screening  04/26/2025   DTaP/Tdap/Td vaccine (4 - Td or Tdap) 05/09/2033   Pneumonia Vaccine  Completed   Flu Shot  Completed   Hepatitis C Screening  Completed   HPV Vaccine  Aged Out  *Topic was postponed. The date shown is not the original due date.   Lab Results  Component Value Date   HGBA1C 8.3 (H) 03/05/2023

## 2023-06-05 NOTE — Assessment & Plan Note (Signed)
 Labs recently done -  Lab Results  Component Value Date   CHOL 170 03/05/2023   HDL 63 03/05/2023   LDLCALC 91 03/05/2023   TRIG 70 03/05/2023   CHOLHDL 2.7 03/05/2023  Good statin compliance

## 2023-06-05 NOTE — Assessment & Plan Note (Signed)
 Strickly must avoid NSAIDs due to past severe upper GI bleed with NSAIDs for neck pain

## 2023-06-05 NOTE — Assessment & Plan Note (Signed)
 Last OV was uncontrolled Currently on rybelsus and tolerating only 3 mg dose Also on jardiance Recheck A1c Foot exam done

## 2023-06-06 NOTE — Telephone Encounter (Signed)
 Following up on pt PAP Novo Nordisk  gave pt a call pt did not answer left a HIPAA VM.

## 2023-06-07 ENCOUNTER — Ambulatory Visit: Payer: Medicare Other | Admitting: Physician Assistant

## 2023-06-07 ENCOUNTER — Encounter: Payer: Self-pay | Admitting: Internal Medicine

## 2023-06-07 NOTE — Telephone Encounter (Signed)
 Received provider portion for [employer] Rybelsus waiting on pt portion will follow up.

## 2023-06-11 NOTE — Telephone Encounter (Signed)
 Following up on pt PAP Novo Nordisk left a HIPAA VM for pt to call back.

## 2023-06-13 DIAGNOSIS — M25511 Pain in right shoulder: Secondary | ICD-10-CM | POA: Diagnosis not present

## 2023-06-13 DIAGNOSIS — R600 Localized edema: Secondary | ICD-10-CM | POA: Diagnosis not present

## 2023-06-15 ENCOUNTER — Other Ambulatory Visit: Payer: Self-pay | Admitting: Pharmacist

## 2023-06-15 ENCOUNTER — Telehealth: Payer: Self-pay | Admitting: Pharmacist

## 2023-06-15 DIAGNOSIS — E1169 Type 2 diabetes mellitus with other specified complication: Secondary | ICD-10-CM

## 2023-06-15 NOTE — Patient Instructions (Signed)
 Goals Addressed             This Visit's Progress    Pharmacy Goals       Please watch the mail for an envelope from [facility] containing the patient assistance program application. Please complete this application and bring to office to have it faxed back to Attention: Lillia Abed at Fax # [phone] along with a copy of your Medicare Part D prescription card and a copy of your proof of income document.  If you need to call Darien Ramus, you can reach her at [phone].  Estelle Grumbles, PharmD, [facility] [phone]

## 2023-06-15 NOTE — Progress Notes (Signed)
   06/15/2023  Patient ID: Paul Ingram, male   DOB: 10-Oct-1955, 68 y.o.   MRN: [id number]  Receive a message from CPhT Lillia Abed requesting assistance with reaching patient as she has been unable to reach him to follow up regarding [employer] patient assistance program application.  Was unable to reach patient via telephone today and have left HIPAA compliant voicemail asking patient to return my call.   Note have upcoming telephone appointment with him scheduled for 06/27/2023 at 9:00 AM   Estelle Grumbles, PharmD, [facility] [phone]

## 2023-06-15 NOTE — Progress Notes (Signed)
   06/15/2023  Patient ID: Paul Ingram, male   DOB: Sep 17, 1955, 68 y.o.   MRN: [id number]  Receive a call back from patient/spouse.   Spouse states that they may have received the [employer] patient assistance program application as mailed by Chales Abrahams on 05/24/2023.  States will look through their mail for the application. Once find it or if unable to, will plan to follow up with Ana by Monday.   Estelle Grumbles, PharmD, [facility] [phone]

## 2023-06-22 ENCOUNTER — Telehealth: Payer: Self-pay | Admitting: Family Medicine

## 2023-06-22 NOTE — Telephone Encounter (Signed)
 Copied from CRM [id number]. Topic: General - Other >> Jun 22, 2023 11:40 AM Emylou G wrote: Reason for CRM: Wife called.. patient is having balance issues..wants to know if someone can come to the house 2 hours a day when she isn't home.. how to get that setup? [phone]

## 2023-06-25 NOTE — Telephone Encounter (Signed)
 Lvm asking pt to return call to schedule appt

## 2023-06-27 ENCOUNTER — Other Ambulatory Visit: Payer: Self-pay | Admitting: Pharmacist

## 2023-06-27 ENCOUNTER — Telehealth: Payer: Self-pay

## 2023-06-27 NOTE — Telephone Encounter (Signed)
 Copied from CRM [id number]. Topic: Clinical - Prescription Issue >> Jun 27, 2023  3:10 PM Fredrica W wrote: Reason for CRM: Novonordisk Patient Assistance program called. Received application for patient but the directions for medication portion was not completed. Needs to be sent back with directions. Request sending pages 7-9 back in once updated. Fax: [phone] Thank You

## 2023-06-27 NOTE — Telephone Encounter (Signed)
 Following up on pt application for Novo nordisk Rybelsus per [employer] the application is missing provider instruction ,will re fax today to [employer] and will follow up in a couple of day.

## 2023-06-28 NOTE — Telephone Encounter (Signed)
 Pt call back,pt was inform his  [employer] application for Rybelsus was APPROVED for 2025,they will be mailing out with in 7-10 business day to Dr office once they received they will give him a call,

## 2023-06-28 NOTE — Telephone Encounter (Signed)
 Following up with [employer] on pt application ,pt has been APPROVE for Rubelsus 3mg  and 7 mg for 2025.left a HIPAA VM with pt.

## 2023-06-29 DIAGNOSIS — R600 Localized edema: Secondary | ICD-10-CM | POA: Diagnosis not present

## 2023-06-29 DIAGNOSIS — M25511 Pain in right shoulder: Secondary | ICD-10-CM | POA: Diagnosis not present

## 2023-07-02 DIAGNOSIS — R600 Localized edema: Secondary | ICD-10-CM | POA: Diagnosis not present

## 2023-07-02 DIAGNOSIS — M25511 Pain in right shoulder: Secondary | ICD-10-CM | POA: Diagnosis not present

## 2023-07-06 ENCOUNTER — Ambulatory Visit: Admitting: Family Medicine

## 2023-07-09 ENCOUNTER — Ambulatory Visit: Admitting: Family Medicine

## 2023-07-09 DIAGNOSIS — R600 Localized edema: Secondary | ICD-10-CM | POA: Diagnosis not present

## 2023-07-09 DIAGNOSIS — M25511 Pain in right shoulder: Secondary | ICD-10-CM | POA: Diagnosis not present

## 2023-07-10 ENCOUNTER — Ambulatory Visit: Payer: Self-pay

## 2023-07-10 ENCOUNTER — Ambulatory Visit: Admitting: Family Medicine

## 2023-07-10 NOTE — Telephone Encounter (Signed)
 Chief Complaint: Unsteady balance  Symptoms: unsteady balance, weakness,forgetful  Frequency: ongoing issue getting worse Pertinent Negatives: Patient denies dizziness, headache, numbness or weakness on side of the body Disposition: [] ED /[] Urgent Care (no appt availability in office) / [x] Appointment(In office/virtual)/ []  [facility]/ [] Home Care/ [] Refused Recommended Disposition /[] [facility]/ []  Follow-up with PCP Additional Notes: Patient's wife called to rescheduled patient's appointment for this afternoon. Patient's wife states she has to be at work during his appointment and he can not drive because he has a sling on his arm. Patient's wife also stated she needs to be at the appointment because he is forgetful and she wants to make sure she can go over everything with his provider. Care advice was given and patient was rescheduled for Thursday morning which is better for the wife's work schedule.   Copied from CRM [id number]. Topic: Clinical - Red Word Triage >> Jul 10, 2023 10:38 AM Turkey B wrote: [facility] that prompted transfer to Nurse Triage: pt's wife called in, pt feels off balance Reason for Disposition  [1] Weakness of arm / hand, or leg / foot AND [2] is a chronic symptom (recurrent or ongoing AND present > 4 weeks)  Answer Assessment - Initial Assessment Questions 1. SYMPTOM: "What is the main symptom you are concerned about?" (e.g., weakness, numbness)     Weakness  2. ONSET: "When did this start?" (minutes, hours, days; while sleeping)     Ongoing  3. LAST NORMAL: "When was the last time you (the patient) were normal (no symptoms)?"     Today  4. PATTERN "Does this come and go, or has it been constant since it started?"  "Is it present now?"     Comes and goes  5. CARDIAC SYMPTOMS: "Have you had any of the following symptoms: chest pain, difficulty breathing, palpitations?"     No  6. NEUROLOGIC SYMPTOMS: "Have you had any of the following  symptoms: headache, dizziness, vision loss, double vision, changes in speech, unsteady on your feet?"     Unsteady on his feet  7. OTHER SYMPTOMS: "Do you have any other symptoms?"     Forgetful  8. PREGNANCY: "Is there any chance you are pregnant?" "When was your last menstrual period?"     N/A  Protocols used: Neurologic Deficit-A-AH

## 2023-07-12 ENCOUNTER — Encounter: Payer: Self-pay | Admitting: Family Medicine

## 2023-07-12 ENCOUNTER — Ambulatory Visit: Admitting: Family Medicine

## 2023-07-12 ENCOUNTER — Telehealth: Payer: Self-pay | Admitting: Family Medicine

## 2023-07-12 ENCOUNTER — Encounter: Payer: Self-pay | Admitting: Sleep Medicine

## 2023-07-12 VITALS — BP 168/98 | HR 96 | Temp 97.9°F | Resp 18 | Ht 68.0 in | Wt 214.9 lb

## 2023-07-12 DIAGNOSIS — R42 Dizziness and giddiness: Secondary | ICD-10-CM | POA: Diagnosis not present

## 2023-07-12 DIAGNOSIS — H6993 Unspecified Eustachian tube disorder, bilateral: Secondary | ICD-10-CM

## 2023-07-12 DIAGNOSIS — J329 Chronic sinusitis, unspecified: Secondary | ICD-10-CM | POA: Diagnosis not present

## 2023-07-12 DIAGNOSIS — G4733 Obstructive sleep apnea (adult) (pediatric): Secondary | ICD-10-CM | POA: Diagnosis not present

## 2023-07-12 DIAGNOSIS — I69352 Hemiplegia and hemiparesis following cerebral infarction affecting left dominant side: Secondary | ICD-10-CM

## 2023-07-12 DIAGNOSIS — R413 Other amnesia: Secondary | ICD-10-CM | POA: Diagnosis not present

## 2023-07-12 DIAGNOSIS — H9193 Unspecified hearing loss, bilateral: Secondary | ICD-10-CM

## 2023-07-12 DIAGNOSIS — I10 Essential (primary) hypertension: Secondary | ICD-10-CM

## 2023-07-12 DIAGNOSIS — Z8673 Personal history of transient ischemic attack (TIA), and cerebral infarction without residual deficits: Secondary | ICD-10-CM

## 2023-07-12 MED ORDER — FLUTICASONE PROPIONATE 50 MCG/ACT NA SUSP: 2.0000 | Freq: Every day | NASAL | 6 refills | Status: DC

## 2023-07-12 MED ORDER — MECLIZINE HCL 25 MG PO TABS
12.5000 mg | ORAL_TABLET | Freq: Three times a day (TID) | ORAL | 1 refills | Status: DC | PRN
Start: 2023-07-12 — End: 2023-12-28

## 2023-07-12 NOTE — Progress Notes (Signed)
 Patient ID: Paul Ingram, male    DOB: [birth year], 68 y.o.   MRN: [id number]  PCP: Danelle Berry, PA-C  Chief Complaint  Patient presents with   Dizziness    Unsteady gait w/ forgetfulness    Subjective:   Paul Ingram is a 68 y.o. male, presents to clinic with CC of the following:  HPI  Vertigo/dizziness since over a year ago - episodic, wife is here with pt He reports episodes feeling dizzy when he doesn't take medicine and he sometimes has palpitations with it Home BP readings Yesterday 144/102, this am 130/85 In office is elevated today On valsartan and amlodipine BP Readings from Last 3 Encounters:  07/12/23 (!) 168/98  06/05/23 128/76  03/29/23 130/70    Dizzy/vertigo Sometimes happens randomly like when he's already walking Dose seem to be brought on with standing up or getting out of bed In the exam room if he turns his head to the right it makes him feel a little dizzy, hearing is a little worse and having some pollen seaonsal allergies       Patient Active Problem List   Diagnosis Date Noted   Uncontrolled type 2 diabetes mellitus with hyperglycemia, without long-term current use of insulin (HCC) 06/05/2023   Diabetes mellitus (HCC) 03/05/2023   Gastroesophageal reflux disease 03/05/2023   History of colonic polyps 04/26/2022   Adenomatous polyp of colon 04/26/2022   Hemiparesis of left dominant side as late effect of cerebral infarction (HCC) 11/18/2021   Brain lesion 11/18/2021   DDD (degenerative disc disease), cervical 11/18/2021   DDD (degenerative disc disease), lumbar 11/18/2021   Alcoholism (HCC) 08/12/2021   Recurrent low back pain 08/12/2021   History of GI bleed 08/12/2021   Lower urinary tract symptoms (LUTS) 08/12/2021   Dilated aortic root (HCC) 12/13/2020   Elevated LFTs 12/13/2020   Abnormal brain MRI 12/13/2020   B12 deficiency 12/13/2020   History of CVA in adulthood 04/26/2020   Insomnia 10/30/2019   Mixed hyperlipidemia  08/28/2019   Erectile dysfunction 08/28/2019   OSA (obstructive sleep apnea) 08/28/2019   Eczema 02/24/2015   Primary hypertension 08/26/2014   Dyslipidemia associated with type 2 diabetes mellitus (HCC) 08/26/2014      Current Outpatient Medications:    amLODipine-valsartan (EXFORGE) 10-160 MG tablet, Take 1 tablet by mouth daily., Disp: 90 tablet, Rfl: 1   Blood Glucose Monitoring Suppl (ONETOUCH VERIO FLEX SYSTEM) w/Device KIT, Use to check blood sugar up to twice daily as directed, Disp: 1 kit, Rfl: 0   Cyanocobalamin (VITAMIN B-12) 1000 MCG SUBL, Place 1 tablet (1,000 mcg total) under the tongue daily at 12 noon., Disp: 100 tablet, Rfl: 1   empagliflozin (JARDIANCE) 25 MG TABS tablet, Take 1 tablet (25 mg total) by mouth daily before breakfast., Disp: 90 tablet, Rfl: 0   glucose blood (ONETOUCH VERIO) test strip, Use to check blood sugar twice daily, Disp: 200 each, Rfl: 3   Lancets (ONETOUCH DELICA PLUS LANCET30G) MISC, Use to check blood sugar twice daily, Disp: 200 each, Rfl: 3   pantoprazole (PROTONIX) 40 MG tablet, Take 1 tablet (40 mg total) by mouth daily., Disp: 90 tablet, Rfl: 1   Semaglutide (RYBELSUS) 3 MG TABS, Take 1 tablet (3 mg total) by mouth daily., Disp: 30 tablet, Rfl: 0   atorvastatin (LIPITOR) 40 MG tablet, Take 1 tablet (40 mg total) by mouth daily. (Patient not taking: Reported on 07/12/2023), Disp: 90 tablet, Rfl: 1   Allergies  Allergen Reactions  Shellfish Allergy Anaphylaxis   Metformin And Related Itching     Social History   Tobacco Use   Smoking status: Former    Current packs/day: 0.00    Average packs/day: 0.3 packs/day for 42.0 years (12.6 ttl pk-yrs)    Types: Cigarettes    Start date: 04/27/1978    Quit date: 04/27/2020    Years since quitting: 3.2   Smokeless tobacco: Never  Vaping Use   Vaping status: Never Used  Substance Use Topics   Alcohol use: Yes    Alcohol/week: 6.0 standard drinks of alcohol    Types: 6 Shots of liquor per  week    Comment: liquor down to 2 shots once a week   Drug use: No      Chart Review Today: I personally reviewed active problem list, medication list, allergies, family history, social history, health maintenance, notes from last encounter, lab results, imaging with the patient/caregiver today.   Review of Systems  Constitutional: Negative.   HENT: Negative.    Eyes: Negative.   Respiratory: Negative.    Cardiovascular: Negative.   Gastrointestinal: Negative.   Endocrine: Negative.   Genitourinary: Negative.   Musculoskeletal: Negative.   Skin: Negative.   Allergic/Immunologic: Negative.   Neurological: Negative.   Hematological: Negative.   Psychiatric/Behavioral: Negative.    All other systems reviewed and are negative.      Objective:   Vitals:   07/12/23 1122  BP: (!) 168/98  Pulse: 96  Resp: 18  Temp: 97.9 F (36.6 C)  SpO2: 97%  Weight: 214 lb 14.4 oz (97.5 kg)  Height: 5\' 8"  (1.727 m)    Body mass index is 32.68 kg/m.  Orthostatic VS for the past 24 hrs:  BP- Lying Pulse- Lying BP- Sitting Pulse- Sitting BP- Standing at 0 minutes Pulse- Standing at 0 minutes  07/12/23 1207 152/90 72 150/88 77 150/86 77   Orthostatics reviewed and  NEG    Physical Exam Vitals and nursing note reviewed.  Constitutional:      General: He is not in acute distress.    Appearance: He is well-developed. He is obese. He is not ill-appearing, toxic-appearing or diaphoretic.     Comments: Looks slightly uncomfortable, NAD  HENT:     Head: Normocephalic and atraumatic.     Right Ear: Tympanic membrane, ear canal and external ear normal. There is no impacted cerumen.     Left Ear: Tympanic membrane, ear canal and external ear normal. There is no impacted cerumen.     Nose: Congestion and rhinorrhea present.     Mouth/Throat:     Mouth: Mucous membranes are moist.     Pharynx: Oropharynx is clear. Posterior oropharyngeal erythema present. No oropharyngeal exudate.  Eyes:      General: No scleral icterus.       Right eye: No discharge.        Left eye: No discharge.     Conjunctiva/sclera: Conjunctivae normal.     Pupils: Pupils are equal, round, and reactive to light.  Neck:     Trachea: No tracheal deviation.     Comments: Neck - Rotation bilaterally greater than 90 degrees, slightly limited flexion and extension (chronic) Cardiovascular:     Rate and Rhythm: Normal rate and regular rhythm.     Pulses: Normal pulses.     Heart sounds: Normal heart sounds. No murmur heard.    No friction rub. No gallop.  Pulmonary:     Effort: Pulmonary effort is normal. No  respiratory distress.     Breath sounds: Normal breath sounds. No stridor. No wheezing, rhonchi or rales.  Musculoskeletal:     Comments: Right shoulder limited ROM  Skin:    General: Skin is warm and dry.     Findings: No rash.  Neurological:     Mental Status: He is alert.     Cranial Nerves: No dysarthria or facial asymmetry.     Comments: MENTAL STATUS: AAOx3  LANG/SPEECH: Naming and repetition intact, fluent, no dysarthria, follows 3-step commands, answers questions appropriately  CRANIAL NERVES:   II: Pupils equal and reactive, no RAPD   III, IV, VI: EOM intact, no gaze preference or deviation, no nystagmus.   V: normal sensation in V1, V2, and V3 segments bilaterally   VII: no asymmetry, no nasolabial fold flattening   VIII: normal hearing to speech   IX, X: normal palatal elevation, no uvular deviation   XI: 5/5 head turn and 5/5 shoulder shrug bilaterally   XII: midline tongue protrusion  MOTOR:  5/5 bilateral grip strength  SENSORY:  Normal to light touch Romberg absent - eval limited  COORD: Normal finger to nose, + tremor, no dysmetria  STATION: normal stance, no truncal ataxia  GAIT: Normal; patient unable to tip-toe   Psychiatric:        Behavior: Behavior normal. Behavior is cooperative.      Results for orders placed or performed in visit on 06/07/23  HM  DIABETES EYE EXAM   Collection Time: 04/25/23  1:40 PM  Result Value Ref Range   HM Diabetic Eye Exam No Retinopathy No Retinopathy       Assessment & Plan:   1. Vertigo (Primary) See full MDM below Hx - onset more than a year ago Episodes are very brief and intermittent, sometimes triggered by position changes, rolling over in bed or head movements but also occurs randomly, described as dizziness and loss of balance  - meclizine (ANTIVERT) 25 MG tablet; Take 0.5-1 tablets (12.5-25 mg total) by mouth 3 (three) times daily as needed for dizziness.  Dispense: 30 tablet; Refill: 1 - Ambulatory referral to ENT - Ambulatory referral to Physical Therapy  2. Rhinosinusitis Inflammation to his nasal mucosa and posterior oropharynx with congestion symptoms ears feel plugged decreased hearing He states symptoms are worse recently with pollen encouraged him to start an antihistamine and use intranasal steroid spray  - fluticasone (FLONASE) 50 MCG/ACT nasal spray; Place 2 sprays into both nostrils daily.  Dispense: 16 g; Refill: 6 - Ambulatory referral to ENT  3. Decreased hearing of both ears hearing feels blocked bilaterally, his wife says he has had significant change in hearing especially on the right, outer ear, TM and canals bilaterally are normal appearing on exam - ETD? Do tx as noted above in #2 and follow-up with ENT for further evaluation of hearing - Ambulatory referral to ENT  4. Dysfunction of both eustachian tubes See #2 and 3 - fluticasone (FLONASE) 50 MCG/ACT nasal spray; Place 2 sprays into both nostrils daily.  Dispense: 16 g; Refill: 6 - Ambulatory referral to ENT  5. Primary hypertension Home blood pressure readings have been 130s to 150s, here in office blood pressure is elevated and not at goal but he is in a significant amount of pain Orthostatic showed slight improvement in blood pressure when done at the end of the office visit today We will continue his  amlodipine-valsartan for now, continue to monitor blood pressure readings at home BP follow-up in office  in the next 4 weeks would need to increase med doses or add an extra medicine if SBP average at home stays >140 Reviewed with plan with Pt and wife Other recent OV BP was controlled and at goal on same meds. So no med changes today BP Readings from Last 3 Encounters:  07/12/23 (!) 168/98  06/05/23 128/76  03/29/23 130/70     6. History of CVA in adulthood Hx of CVA with left sided hemiparesis- unclear of his neuro baseline, he notes some decreased sensation to left hand and intermittent sx to LLE He was recently referred back to neurology Higher risk of another stroke - reviewed this with pt and wife and explained preventative measures - unable to take ASA or plavix due to GI bleed, need to get BP controlled, lipids/LDL not at goal  7. Hemiparesis of left dominant side as late effect of cerebral infarction (HCC) See #6  8. OSA (obstructive sleep apnea) Memory issues may be related to no CPAP with known OSA- discussed a little at last OV in March - refer back to pulm - Ambulatory referral to Pulmonology  9. Memory changes See #8   Orthostatics negative - no other associated cardiac sx with episodes HTN uncontrolled may be related, but also pt is in a lot of pain postop right shoulder- see plan for HTN above Vertigo -we will be treating with meclizine, treating inflamed sinuses and possible eustachian tube dysfunction with arrange follow-up with ENT and PT evaluation His neurological exam is difficult and somewhat limited today due to past stroke with neurodeficits that I am not familiar with his true baseline and with recent right shoulder surgery- neuroexam was limited with some things he could not do CN grossly intact, gait normal in room, grossly normal sensation to light touch to bilateral upper extremities and symmetrical grip strength, He was not able to to do all coordination  testing but what he could do was normal, he could not do some gait testing including Heel-to-shin/walking in the line -so overall with symptoms on and off for more than a year and no focal neurological deficit I had no concerns for an acute neurochange or new stroke, doubt central vertigo, symptoms reproducible easily with head movements or positional changes may be BPPV or a vestibular labyrinthitis issue-which I am initiating treatment for and referring to ENT and PT for further evaluation and management treatment Reviewed the plan with the patient and his wife at length today, red flags and ER precautions reviewed at length, they verbalized understanding  Return for BP recheck 4 weeks, needs MWV as well .   Adeline Hone, PA-C 07/12/23 11:43 AM

## 2023-07-12 NOTE — Patient Instructions (Signed)
 Try the meclizine, and I also recommend a daily antihistamine and steroid nose spray daily for at least 2-4 weeks for treating ear symptoms.  Continue to be careful with position changes and getting out of bed in the morning. Continue to watch your blood pressure at home and please come back in the next month if the average is over 140/90.  We will need to adjust your treatment to improve the blood pressure control.  I have referred you to ENT and PT for further evaluation  Go to the ER or call 911 if you have severe sudden balance or vision changes, new severe HA, facial droop, slurred speech or balance coordination gait or mobility changes that are sudden and not like your current brief episodes of vertigo

## 2023-07-12 NOTE — Telephone Encounter (Signed)
 Please sch awv

## 2023-07-13 DIAGNOSIS — M25511 Pain in right shoulder: Secondary | ICD-10-CM | POA: Diagnosis not present

## 2023-07-13 DIAGNOSIS — R600 Localized edema: Secondary | ICD-10-CM | POA: Diagnosis not present

## 2023-07-18 ENCOUNTER — Telehealth: Payer: Self-pay

## 2023-07-18 ENCOUNTER — Other Ambulatory Visit: Payer: Self-pay | Admitting: Pharmacist

## 2023-07-18 DIAGNOSIS — E1169 Type 2 diabetes mellitus with other specified complication: Secondary | ICD-10-CM

## 2023-07-18 NOTE — Patient Instructions (Signed)
 Goals Addressed             This Visit's Progress    Pharmacy Goals       If you need to reach out to patient assistance programs regarding refills or to find out the status of your application, you can do so by calling:  [employer] at [phone]  Arthur Lash, PharmD, [facility] [phone]

## 2023-07-18 NOTE — Progress Notes (Signed)
 07/18/2023 Name: Paul Ingram MRN: [id number] DOB: 1956/01/14  Chief Complaint  Patient presents with   Medication Management   Medication Assistance    Paul Ingram is a 68 y.o. year old male who presented for a telephone visit.   Patient previously appeared on report for [employer] - Diabetes Control report due to documented A1C of 8.3% on 03/05/2023. Latest A1C on 06/05/2023 improved to 6.6%   Outreached patient to discuss diabetes control and medication management.      Subjective:   Care Team: Primary Care Provider: Adeline Hone, PA-C Neurologist: Herold Lora, MD  Medication Access/Adherence  Current Pharmacy:  [facility] [phone] [place], [place] - [address] [place] [address] Phone: [phone] Fax: [phone]   Patient reports affordability concerns with their medications: Yes  Patient reports access/transportation concerns to their pharmacy: No  Patient reports adherence concerns with their medications:  No      Diabetes:   Current medications:  - Jardiance  25 mg daily - Rybelsus  3 mg daily Confirms taking on an empty stomach with up to 4 ounces of plain water  only, >=30 minutes before eating food, drinking beverages, or taking other oral medications.   Medications tried in the past: metformin  (itching)   Denies checking home blood sugar yet   Reports working on improving dietary habits, including reducing intake of sugary beverages and alcohol, drinking water  instead           Current physical activity: reports recently limited to movement around home since rotator cuff surgery   Statin therapy: atorvastatin  40 mg daily   Current medication access support: enrolled in patient assistance for Rybelsus  from [employer] through 03/26/2024 - Per message from CPhT Floyd Hutchinson, shipment expected to arrive to office within 7-10 business days from 06/28/2023. Patient plans to pick up when receives call from  office   Objective:  Lab Results  Component Value Date   HGBA1C 6.6 (A) 06/05/2023    Lab Results  Component Value Date   CREATININE 0.79 03/05/2023   BUN 6 (L) 03/05/2023   NA 142 03/05/2023   K 3.5 03/05/2023   CL 101 03/05/2023   CO2 31 03/05/2023    Lab Results  Component Value Date   CHOL 170 03/05/2023   HDL 63 03/05/2023   LDLCALC 91 03/05/2023   TRIG 70 03/05/2023   CHOLHDL 2.7 03/05/2023   Current Outpatient Medications on File Prior to Visit  Medication Sig Dispense Refill   empagliflozin  (JARDIANCE ) 25 MG TABS tablet Take 1 tablet (25 mg total) by mouth daily before breakfast. 90 tablet 0   Semaglutide  (RYBELSUS ) 3 MG TABS Take 1 tablet (3 mg total) by mouth daily. 30 tablet 0   amLODipine -valsartan  (EXFORGE ) 10-160 MG tablet Take 1 tablet by mouth daily. 90 tablet 1   atorvastatin  (LIPITOR) 40 MG tablet Take 1 tablet (40 mg total) by mouth daily. (Patient not taking: Reported on 07/12/2023) 90 tablet 1   Blood Glucose Monitoring Suppl (ONETOUCH VERIO FLEX SYSTEM) w/Device KIT Use to check blood sugar up to twice daily as directed 1 kit 0   Cyanocobalamin  (VITAMIN B-12) 1000 MCG SUBL Place 1 tablet (1,000 mcg total) under the tongue daily at 12 noon. 100 tablet 1   fluticasone  (FLONASE ) 50 MCG/ACT nasal spray Place 2 sprays into both nostrils daily. 16 g 6   glucose blood (ONETOUCH VERIO) test strip Use to check blood sugar twice daily 200 each 3  Lancets (ONETOUCH DELICA PLUS LANCET30G) MISC Use to check blood sugar twice daily 200 each 3   meclizine  (ANTIVERT ) 25 MG tablet Take 0.5-1 tablets (12.5-25 mg total) by mouth 3 (three) times daily as needed for dizziness. 30 tablet 1   pantoprazole  (PROTONIX ) 40 MG tablet Take 1 tablet (40 mg total) by mouth daily. 90 tablet 1   No current facility-administered medications on file prior to visit.       Assessment/Plan:   Diabetes: - Currently uncontrolled - Reviewed long term cardiovascular and renal outcomes  of uncontrolled blood sugar - Reviewed goal A1c, goal fasting, and goal 2 hour post prandial glucose - Reviewed dietary modifications including importance of having regular well-balanced meals and snacks throughout the day, while controlling carbohydrate portion sizes             Encourage patient to avoid consumption of sugary beverages             Encourage patient to increase consumption of non-starchy vegetables - Recommend to check glucose, keep log of results and have this record to review at upcoming medical appointments. Patient to contact provider office sooner if needed for readings outside of established parameters or symptoms     Follow Up Plan: Clinical Pharmacist will follow up with patient by telephone next month    Arthur Lash, PharmD, [facility] [phone]

## 2023-07-20 DIAGNOSIS — R600 Localized edema: Secondary | ICD-10-CM | POA: Diagnosis not present

## 2023-07-20 DIAGNOSIS — M25511 Pain in right shoulder: Secondary | ICD-10-CM | POA: Diagnosis not present

## 2023-07-23 DIAGNOSIS — R600 Localized edema: Secondary | ICD-10-CM | POA: Diagnosis not present

## 2023-07-23 DIAGNOSIS — M25511 Pain in right shoulder: Secondary | ICD-10-CM | POA: Diagnosis not present

## 2023-07-24 LAB — LAB REPORT - SCANNED: Microalb Creat Ratio: 30

## 2023-07-27 DIAGNOSIS — M25511 Pain in right shoulder: Secondary | ICD-10-CM | POA: Diagnosis not present

## 2023-07-27 DIAGNOSIS — R600 Localized edema: Secondary | ICD-10-CM | POA: Diagnosis not present

## 2023-07-29 ENCOUNTER — Other Ambulatory Visit: Payer: Self-pay | Admitting: Family Medicine

## 2023-07-29 DIAGNOSIS — E1159 Type 2 diabetes mellitus with other circulatory complications: Secondary | ICD-10-CM

## 2023-07-31 NOTE — Telephone Encounter (Signed)
 Requested medications are due for refill today.  yes  Requested medications are on the active medications list.  yes  Last refill. 06/05/2023 #30 0 rf  Future visit scheduled.   yes  Notes to clinic.  Medication not assigned to a protocol. Please review for refill.    Requested Prescriptions  Pending Prescriptions Disp Refills   RYBELSUS  3 MG TABS [Pharmacy Med Name: RYBELSUS  3 MG TABLET] 30 tablet 0    Sig: TAKE 1 TABLET BY MOUTH DAILY     Off-Protocol Failed - 07/31/2023  2:12 PM      Failed - Medication not assigned to a protocol, review manually.      Passed - Valid encounter within last 12 months    Recent Outpatient Visits           2 weeks ago Vertigo   [facility] Adeline Hone, PA-C   1 month ago Type 2 diabetes mellitus with other circulatory complication, without long-term current use of insulin [facility])   [facility] Adeline Hone, PA-C       Future Appointments             Tomorrow Tapia, Leisa, PA-C [facility]   In 1 month Adeline Hone, PA-C  [facility], [facility]

## 2023-08-01 ENCOUNTER — Ambulatory Visit (INDEPENDENT_AMBULATORY_CARE_PROVIDER_SITE_OTHER): Admitting: Family Medicine

## 2023-08-01 ENCOUNTER — Encounter: Payer: Self-pay | Admitting: Family Medicine

## 2023-08-01 VITALS — BP 118/74 | HR 88 | Resp 16 | Ht 68.0 in | Wt 216.0 lb

## 2023-08-01 DIAGNOSIS — E1165 Type 2 diabetes mellitus with hyperglycemia: Secondary | ICD-10-CM

## 2023-08-01 DIAGNOSIS — Z0001 Encounter for general adult medical examination with abnormal findings: Secondary | ICD-10-CM

## 2023-08-01 DIAGNOSIS — Z136 Encounter for screening for cardiovascular disorders: Secondary | ICD-10-CM | POA: Diagnosis not present

## 2023-08-01 DIAGNOSIS — E785 Hyperlipidemia, unspecified: Secondary | ICD-10-CM

## 2023-08-01 DIAGNOSIS — Z0000 Encounter for general adult medical examination without abnormal findings: Secondary | ICD-10-CM

## 2023-08-01 DIAGNOSIS — Z125 Encounter for screening for malignant neoplasm of prostate: Secondary | ICD-10-CM

## 2023-08-01 DIAGNOSIS — E1169 Type 2 diabetes mellitus with other specified complication: Secondary | ICD-10-CM | POA: Diagnosis not present

## 2023-08-01 DIAGNOSIS — Z7189 Other specified counseling: Secondary | ICD-10-CM
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
## 2023-08-01 NOTE — Patient Instructions (Addendum)
 Paul Ingram , Thank you for taking time to come for your Medicare Wellness Visit. I appreciate your ongoing commitment to your health goals. Please review the following plan we discussed and let me know if I can assist you in the future.   These are the goals we discussed:  Goals      Pharmacy Goals     If you need to reach out to patient assistance programs regarding refills or to find out the status of your application, you can do so by calling:  [employer] at [phone]  Arthur Lash, PharmD, [facility] [phone]         This is a list of the screening recommended for you and due dates:  Health Maintenance  Topic Date Due   Zoster (Shingles) Vaccine (1 of 2) 09/04/2023*   Flu Shot  10/26/2023   COVID-19 Vaccine (4 - Moderna risk 2024-25 season) 10/28/2023   Hemoglobin A1C  12/06/2023   Yearly kidney function blood test for diabetes  03/04/2024   Yearly kidney health urinalysis for diabetes  03/04/2024   Eye exam for diabetics  04/24/2024   Complete foot exam   06/04/2024   Medicare Annual Wellness Visit  07/31/2024   Colon Cancer Screening  04/26/2025   DTaP/Tdap/Td vaccine (4 - Td or Tdap) 05/09/2033   Pneumonia Vaccine  Completed   Hepatitis C Screening  Completed   HPV Vaccine  Aged Out   Meningitis B Vaccine  Aged Out  *Topic was postponed. The date shown is not the original due date.      Fall Prevention in the Home, Adult Falls can cause injuries and can happen to people of all ages. There are many things you can do to make your home safer and to help prevent falls. What actions can I take to prevent falls? General information Use good lighting in all rooms. Make sure to: Replace any light bulbs that burn out. Turn on the lights in dark areas and use night-lights. Keep items that you use often in easy-to-reach places. Lower the shelves around your home if needed. Move furniture so that there are clear paths around it. Do not  use throw rugs or other things on the floor that can make you trip. If any of your floors are uneven, fix them. Add color or contrast paint or tape to clearly mark and help you see: Grab bars or handrails. First and last steps of staircases. Where the edge of each step is. If you use a ladder or stepladder: Make sure that it is fully opened. Do not climb a closed ladder. Make sure the sides of the ladder are locked in place. Have someone hold the ladder while you use it. Know where your pets are as you move through your home. What can I do in the bathroom?     Keep the floor dry. Clean up any water  on the floor right away. Remove soap buildup in the bathtub or shower. Buildup makes bathtubs and showers slippery. Use non-skid mats or decals on the floor of the bathtub or shower. Attach bath mats securely with double-sided, non-slip rug tape. If you need to sit down in the shower, use a non-slip stool. Install grab bars by the toilet and in the bathtub and shower. Do not use towel bars as grab bars. What can I do in the bedroom? Make sure that you have a light by your bed that is easy to reach. Do not use any sheets  or blankets on your bed that hang to the floor. Have a firm chair or bench with side arms that you can use for support when you get dressed. What can I do in the kitchen? Clean up any spills right away. If you need to reach something above you, use a step stool with a grab bar. Keep electrical cords out of the way. Do not use floor polish or wax that makes floors slippery. What can I do with my stairs? Do not leave anything on the stairs. Make sure that you have a light switch at the top and the bottom of the stairs. Make sure that there are handrails on both sides of the stairs. Fix handrails that are broken or loose. Install non-slip stair treads on all your stairs if they do not have carpet. Avoid having throw rugs at the top or bottom of the stairs. Choose a carpet  that does not hide the edge of the steps on the stairs. Make sure that the carpet is firmly attached to the stairs. Fix carpet that is loose or worn. What can I do on the outside of my home? Use bright outdoor lighting. Fix the edges of walkways and driveways and fix any cracks. Clear paths of anything that can make you trip, such as tools or rocks. Add color or contrast paint or tape to clearly mark and help you see anything that might make you trip as you walk through a door, such as a raised step or threshold. Trim any bushes or trees on paths to your home. Check to see if handrails are loose or broken and that both sides of all steps have handrails. Install guardrails along the edges of any raised decks and porches. Have leaves, snow, or ice cleared regularly. Use sand, salt, or ice melter on paths if you live where there is ice and snow during the winter. Clean up any spills in your garage right away. This includes grease or oil spills. What other actions can I take? Review your medicines with your doctor. Some medicines can cause dizziness or changes in blood pressure, which increase your risk of falling. Wear shoes that: Have a low heel. Do not wear high heels. Have rubber bottoms and are closed at the toe. Feel good on your feet and fit well. Use tools that help you move around if needed. These include: Canes. Walkers. Scooters. Crutches. Ask your doctor what else you can do to help prevent falls. This may include seeing a physical therapist to learn to do exercises to move better and get stronger. Where to find more information Centers for Disease Control and Prevention, STEAD[employer]: TonerPromos.no [employer] on Aging: BaseRingTones.pl National Institute on Aging: BaseRingTones.pl Contact a doctor if: You are afraid of falling at home. You feel weak, drowsy, or dizzy at home. You fall at home. Get help right away if you: Lose consciousness or have trouble moving after a fall. Have a fall  that causes a head injury. These symptoms may be an emergency. Get help right away. Call 911. Do not wait to see if the symptoms will go away. Do not drive yourself to the hospital. This information is not intended to replace advice given to you by your health care provider. Make sure you discuss any questions you have with your health care provider. Document Revised: 11/14/2021 Document Reviewed: 11/14/2021 Elsevier Patient Education  2024 Elsevier Inc. Health Maintenance, Male Adopting a healthy lifestyle and getting preventive care are important in promoting health and  wellness. Ask your health care provider about: The right schedule for you to have regular tests and exams. Things you can do on your own to prevent diseases and keep yourself healthy. What should I know about diet, weight, and exercise? Eat a healthy diet  Eat a diet that includes plenty of vegetables, fruits, low-fat dairy products, and lean protein. Do not eat a lot of foods that are high in solid fats, added sugars, or sodium. Maintain a healthy weight Body mass index (BMI) is a measurement that can be used to identify possible weight problems. It estimates body fat based on height and weight. Your health care provider can help determine your BMI and help you achieve or maintain a healthy weight. Get regular exercise Get regular exercise. This is one of the most important things you can do for your health. Most adults should: Exercise for at least 150 minutes each week. The exercise should increase your heart rate and make you sweat (moderate-intensity exercise). Do strengthening exercises at least twice a week. This is in addition to the moderate-intensity exercise. Spend less time sitting. Even light physical activity can be beneficial. Watch cholesterol and blood lipids Have your blood tested for lipids and cholesterol at 70 years of age, then have this test every 5 years. You may need to have your cholesterol levels  checked more often if: Your lipid or cholesterol levels are high. You are older than 70 years of age. You are at high risk for heart disease. What should I know about cancer screening? Many types of cancers can be detected early and may often be prevented. Depending on your health history and family history, you may need to have cancer screening at various ages. This may include screening for: Colorectal cancer. Prostate cancer. Skin cancer. Lung cancer. What should I know about heart disease, diabetes, and high blood pressure? Blood pressure and heart disease High blood pressure causes heart disease and increases the risk of stroke. This is more likely to develop in people who have high blood pressure readings or are overweight. Talk with your health care provider about your target blood pressure readings. Have your blood pressure checked: Every 3-5 years if you are 96-53 years of age. Every year if you are 38 years old or older. If you are between the ages of 46 and 83 and are a current or former smoker, ask your health care provider if you should have a one-time screening for abdominal aortic aneurysm (AAA). Diabetes Have regular diabetes screenings. This checks your fasting blood sugar level. Have the screening done: Once every three years after age 51 if you are at a normal weight and have a low risk for diabetes. More often and at a younger age if you are overweight or have a high risk for diabetes. What should I know about preventing infection? Hepatitis B If you have a higher risk for hepatitis B, you should be screened for this virus. Talk with your health care provider to find out if you are at risk for hepatitis B infection. Hepatitis C Blood testing is recommended for: Everyone born from [birth year] through 1967. Anyone with known risk factors for hepatitis C. Sexually transmitted infections (STIs) You should be screened each year for STIs, including gonorrhea and chlamydia,  if: You are sexually active and are younger than 70 years of age. You are older than 70 years of age and your health care provider tells you that you are at risk for this type of infection. Your  sexual activity has changed since you were last screened, and you are at increased risk for chlamydia or gonorrhea. Ask your health care provider if you are at risk. Ask your health care provider about whether you are at high risk for HIV. Your health care provider may recommend a prescription medicine to help prevent HIV infection. If you choose to take medicine to prevent HIV, you should first get tested for HIV. You should then be tested every 3 months for as long as you are taking the medicine. Follow these instructions at home: Alcohol use Do not drink alcohol if your health care provider tells you not to drink. If you drink alcohol: Limit how much you have to 0-2 drinks a day. Know how much alcohol is in your drink. In the [place] one drink equals one 12 oz bottle of beer (355 mL), one 5 oz glass of wine (148 mL), or one 1 oz glass of hard liquor (44 mL). Lifestyle Do not use any products that contain nicotine  or tobacco. These products include cigarettes, chewing tobacco, and vaping devices, such as e-cigarettes. If you need help quitting, ask your health care provider. Do not use street drugs. Do not share needles. Ask your health care provider for help if you need support or information about quitting drugs. General instructions Schedule regular health, dental, and eye exams. Stay current with your vaccines. Tell your health care provider if: You often feel depressed. You have ever been abused or do not feel safe at home. Summary Adopting a healthy lifestyle and getting preventive care are important in promoting health and wellness. Follow your health care provider's instructions about healthy diet, exercising, and getting tested or screened for diseases. Follow your health care provider's  instructions on monitoring your cholesterol and blood pressure. This information is not intended to replace advice given to you by your health care provider. Make sure you discuss any questions you have with your health care provider. Document Revised: 08/02/2020 Document Reviewed: 08/02/2020 Elsevier Patient Education  2024 [employer].

## 2023-08-02 LAB — LIPID PANEL
Cholesterol: 148 mg/dL (ref <200)
HDL: 65 mg/dL (ref >=40)
LDL Cholesterol (Calc): 68 mg/dL
Non-HDL Cholesterol (Calc): 83 mg/dL (ref <130)
Total CHOL/HDL Ratio: 2.3 (calc) (ref <5.0)
Triglycerides: 72 mg/dL (ref <150)

## 2023-08-02 LAB — COMPREHENSIVE METABOLIC PANEL WITH GFR
AG Ratio: 1.2 (calc) (ref 1.0–2.5)
ALT: 38 U/L (ref 9–46)
AST: 80 U/L — ABNORMAL HIGH (ref 10–35)
Albumin: 4.1 g/dL (ref 3.6–5.1)
Alkaline phosphatase (APISO): 51 U/L (ref 35–144)
BUN/Creatinine Ratio: 8 (calc) (ref 6–22)
BUN: 6 mg/dL — ABNORMAL LOW (ref 7–25)
CO2: 35 mmol/L — ABNORMAL HIGH (ref 20–32)
Calcium: 9.5 mg/dL (ref 8.6–10.3)
Chloride: 95 mmol/L — ABNORMAL LOW (ref 98–110)
Creat: 0.71 mg/dL (ref 0.70–1.35)
Globulin: 3.4 g/dL (ref 1.9–3.7)
Glucose, Bld: 128 mg/dL — ABNORMAL HIGH (ref 65–99)
Potassium: 3.2 mmol/L — ABNORMAL LOW (ref 3.5–5.3)
Sodium: 140 mmol/L (ref 135–146)
Total Bilirubin: 1.6 mg/dL — ABNORMAL HIGH (ref 0.2–1.2)
Total Protein: 7.5 g/dL (ref 6.1–8.1)
eGFR: 99 mL/min/{1.73_m2} (ref >=60)

## 2023-08-02 LAB — PSA: PSA: 0.9 ng/mL (ref <=4.00)

## 2023-08-02 LAB — HEMOGLOBIN A1C
Hgb A1c MFr Bld: 6.7 % — ABNORMAL HIGH (ref <5.7)
Mean Plasma Glucose: 146 mg/dL
eAG (mmol/L): 8.1 mmol/L

## 2023-08-02 NOTE — Telephone Encounter (Signed)
 Patient scheduled for AWVI tomorrow at [employer]

## 2023-08-02 NOTE — Telephone Encounter (Signed)
 Left pt vm letting him know that he does not need the appt for AWV on 08/03/2023 per Leisa.   Jennie Moeller states that this was done on 08/01/2023 at his ov.

## 2023-08-02 NOTE — Telephone Encounter (Signed)
 Spoke with pt and he is aware that appt is not needed and canceled.

## 2023-08-03 ENCOUNTER — Other Ambulatory Visit (HOSPITAL_COMMUNITY): Payer: Self-pay

## 2023-08-03 ENCOUNTER — Telehealth: Payer: Self-pay

## 2023-08-03 ENCOUNTER — Ambulatory Visit

## 2023-08-03 DIAGNOSIS — R600 Localized edema: Secondary | ICD-10-CM | POA: Diagnosis not present

## 2023-08-03 DIAGNOSIS — M25511 Pain in right shoulder: Secondary | ICD-10-CM | POA: Diagnosis not present

## 2023-08-03 NOTE — Telephone Encounter (Signed)
 Spoke with [employer] and medication was delivered on 4/26.  They do need a change of medication order form.  Will send that over to office to have faxed into [employer].

## 2023-08-03 NOTE — Telephone Encounter (Signed)
 Good morning, per telephone encounter 06/28/2023 the pt picks up Rybelsus  from the provider office and test claim shows this pt gets medication through drug manufacturer program

## 2023-08-06 NOTE — Addendum Note (Signed)
 Addended by: Arthur Lash A on: 08/06/2023 08:03 AM   Modules accepted: Orders

## 2023-08-06 NOTE — Telephone Encounter (Signed)
 Once form is received we will complete and fax back

## 2023-08-07 NOTE — Telephone Encounter (Signed)
 Have you seen a order request form for him?

## 2023-08-09 ENCOUNTER — Telehealth: Payer: Self-pay

## 2023-08-09 NOTE — Telephone Encounter (Signed)
 We have not received any forms for this patient. If Lindaann Requena needs fax verification it is [phone].

## 2023-08-09 NOTE — Telephone Encounter (Signed)
 Ana pharmacist w/ [employer] (calling from [phone]) stated she had faxed form twice today to office and had received it back each time w/ no information on it. Ana confirmed the fax number to office, [phone].   Please follow up w/ Ana in regards to form for patient

## 2023-08-09 NOTE — Telephone Encounter (Signed)
 Copied from CRM [id number]. Topic: Clinical - Prescription Issue >> Aug 09, 2023  2:43 PM Rennis Case wrote: Reason for CRM: Lindaann Requena who works for [employer], calling to inquire about patient assistance form for rybelsus . Ana states she has faxed it to the office multiple times and has received it without a signature from provider and the license number on the application as well as the medication that is needed.   Lindaann Requena states she will be re-faxing it over again.  Lindaann Requena stated it is stupid that this keeps happening.   Ana stated she would fax paperwork to the pharmacist instead.

## 2023-08-10 ENCOUNTER — Ambulatory Visit: Admitting: Family Medicine

## 2023-08-10 NOTE — Telephone Encounter (Signed)
 I checked in with our front desk who receives the faxes. It turns out anything labeled patient assistance goes straight through to [place] at [phone] due to our fax system. We were unaware which is why we haven't personally had them, and Ana had been receiving them with no signatures. It will need to come with some type of cover sheet saying attention Adeline Hone or my name as it automatically goes to [place] otherwise.   Ana notified

## 2023-08-10 NOTE — Telephone Encounter (Signed)
 I checked in with our front desk who receives the faxes. It turns out anything labeled patient assistance goes straight through to [place] at [phone] due to our fax system. We were unaware which is why we haven't personally had them, and Ana had been receiving them with no signatures. It will need to come with some type of cover sheet saying attention Adeline Hone or my name as it automatically goes to [place] otherwise.   Ana made aware

## 2023-08-13 ENCOUNTER — Ambulatory Visit: Admitting: Family Medicine

## 2023-08-22 ENCOUNTER — Telehealth: Payer: Self-pay | Admitting: Pharmacist

## 2023-08-22 ENCOUNTER — Other Ambulatory Visit: Payer: Self-pay | Admitting: Pharmacist

## 2023-08-22 NOTE — Progress Notes (Signed)
   Outreach Note  08/22/2023 Name: Paul Ingram MRN: [id number] DOB: [birth year]  Referred by: Adeline Hone, PA-C  Was unable to reach patient via telephone today and have left HIPAA compliant voicemail asking patient to return my call.    Follow Up Plan: Will collaborate with Care Guide to outreach to schedule follow up with me  Arthur Lash, PharmD, [facility] [phone]

## 2023-08-27 ENCOUNTER — Telehealth: Payer: Self-pay

## 2023-08-27 DIAGNOSIS — R600 Localized edema: Secondary | ICD-10-CM | POA: Diagnosis not present

## 2023-08-27 DIAGNOSIS — M25511 Pain in right shoulder: Secondary | ICD-10-CM | POA: Diagnosis not present

## 2023-08-27 NOTE — Telephone Encounter (Signed)
 Received APPROVAL letter from [employer] Rybelsus  14 mg have index and left a HIPAA VM at pt telephone number.

## 2023-08-30 ENCOUNTER — Telehealth: Payer: Self-pay

## 2023-08-30 NOTE — Progress Notes (Signed)
 Complex Care Management Care Guide Note  08/30/2023 Name: Paul Ingram MRN: [id number] DOB: 07/08/1955  Paul Ingram is a 68 y.o. year old male who is a primary care patient of Tapia, Leisa, PA-C and is actively engaged with the care management team. I reached out to Paul Ingram by phone today to assist with re-scheduling  with the Pharmacist.  Follow up plan: Unsuccessful telephone outreach attempt made. A HIPAA compliant phone message was left for the patient providing contact information and requesting a return call.  [name] , RMA     [facility]  [facility], [facility] Guide  Direct Dial: [phone]  Website: [email or web address]

## 2023-09-03 DIAGNOSIS — R600 Localized edema: Secondary | ICD-10-CM | POA: Diagnosis not present

## 2023-09-03 DIAGNOSIS — M25511 Pain in right shoulder: Secondary | ICD-10-CM | POA: Diagnosis not present

## 2023-09-05 ENCOUNTER — Ambulatory Visit: Admitting: Family Medicine

## 2023-09-06 ENCOUNTER — Ambulatory Visit
Admission: RE | Admit: 2023-09-06 | Discharge: 2023-09-06 | Disposition: A | Discharge status: Home / Self Care | Source: Ambulatory Visit | Attending: Family Medicine | Admitting: Family Medicine

## 2023-09-06 ENCOUNTER — Ambulatory Visit: Payer: Self-pay | Admitting: Family Medicine

## 2023-09-06 DIAGNOSIS — Z136 Encounter for screening for cardiovascular disorders: Secondary | ICD-10-CM | POA: Diagnosis not present

## 2023-09-06 DIAGNOSIS — Z0000 Encounter for general adult medical examination without abnormal findings: Secondary | ICD-10-CM | POA: Insufficient documentation

## 2023-09-06 DIAGNOSIS — I714 Abdominal aortic aneurysm, without rupture, unspecified: Secondary | ICD-10-CM | POA: Insufficient documentation

## 2023-09-10 DIAGNOSIS — R600 Localized edema: Secondary | ICD-10-CM | POA: Diagnosis not present

## 2023-09-10 DIAGNOSIS — M25511 Pain in right shoulder: Secondary | ICD-10-CM | POA: Diagnosis not present

## 2023-09-12 NOTE — Progress Notes (Signed)
 Complex Care Management Care Guide Note  09/12/2023 Name: Paul Ingram MRN: [id number] DOB: February 13, 1956  Paul Ingram is a 68 y.o. year old male who is a primary care patient of Tapia, Leisa, PA-C and is actively engaged with the care management team. I reached out to Paul Ligas Husband by phone today to assist with re-scheduling  with the Pharmacist.  Follow up plan: Unsuccessful telephone outreach attempt made. A HIPAA compliant phone message was left for the patient providing contact information and requesting a return call.  [name] , RMA     [facility]  [facility], [facility] Guide  Direct Dial: [phone]  Website: [email or web address]

## 2023-09-17 DIAGNOSIS — M25511 Pain in right shoulder: Secondary | ICD-10-CM | POA: Diagnosis not present

## 2023-09-17 DIAGNOSIS — R600 Localized edema: Secondary | ICD-10-CM | POA: Diagnosis not present

## 2023-09-24 DIAGNOSIS — M25511 Pain in right shoulder: Secondary | ICD-10-CM | POA: Diagnosis not present

## 2023-09-24 DIAGNOSIS — R600 Localized edema: Secondary | ICD-10-CM | POA: Diagnosis not present

## 2023-09-27 DIAGNOSIS — Z4889 Encounter for other specified surgical aftercare: Secondary | ICD-10-CM | POA: Diagnosis not present

## 2023-09-27 DIAGNOSIS — E119 Type 2 diabetes mellitus without complications: Secondary | ICD-10-CM | POA: Diagnosis not present

## 2023-09-27 DIAGNOSIS — M7711 Lateral epicondylitis, right elbow: Secondary | ICD-10-CM | POA: Diagnosis not present

## 2023-09-27 NOTE — Telephone Encounter (Signed)
 Received a letter from [employer] requesting a Refill reorder change on Rybelsus , submitting request to provider office,once fax from provider office will fax to [employer].

## 2023-10-02 NOTE — Telephone Encounter (Signed)
 Received provider portion today and has faxed to [employer]. Will follow up in a few days.

## 2023-10-26 ENCOUNTER — Other Ambulatory Visit: Payer: Self-pay | Admitting: Family Medicine

## 2023-10-26 DIAGNOSIS — E1159 Type 2 diabetes mellitus with other circulatory complications: Secondary | ICD-10-CM

## 2023-10-26 NOTE — Telephone Encounter (Signed)
 Requested Prescriptions  Pending Prescriptions Disp Refills   empagliflozin  (JARDIANCE ) 25 MG TABS tablet [Pharmacy Med Name: JARDIANCE  25 MG TABLET] 90 tablet 1    Sig: TAKE 1 TABLET BY MOUTH DAILY BEFORE BREAKFAST.     Endocrinology:  Diabetes - SGLT2 Inhibitors Passed - 10/26/2023  2:29 PM      Passed - Cr in normal range and within 360 days    Creat  Date Value Ref Range Status  08/01/2023 0.71 0.70 - 1.35 mg/dL Final   Creatinine, Urine  Date Value Ref Range Status  03/05/2023 62 20 - 320 mg/dL Final         Passed - HBA1C is between 0 and 7.9 and within 180 days    Hgb A1c MFr Bld  Date Value Ref Range Status  08/01/2023 6.7 (H) <5.7 % Final    Comment:    For someone without known diabetes, a hemoglobin A1c value of 6.5% or greater indicates that they may have  diabetes and this should be confirmed with a follow-up  test. . For someone with known diabetes, a value <7% indicates  that their diabetes is well controlled and a value  greater than or equal to 7% indicates suboptimal  control. A1c targets should be individualized based on  duration of diabetes, age, comorbid conditions, and  other considerations. . Currently, no consensus exists regarding use of hemoglobin A1c for diagnosis of diabetes for children. .          Passed - eGFR in normal range and within 360 days    GFR, Est African American  Date Value Ref Range Status  03/12/2020 94 > OR = 60 mL/min/1.23m2 Final   GFR, Est Non African American  Date Value Ref Range Status  03/12/2020 81 > OR = 60 mL/min/1.46m2 Final   GFR, Estimated  Date Value Ref Range Status  08/16/2021 >60 >60 mL/min Final    Comment:    (NOTE) Calculated using the CKD-EPI Creatinine Equation (2021)    eGFR  Date Value Ref Range Status  08/01/2023 99 > OR = 60 mL/min/1.25m2 Final         Passed - Valid encounter within last 6 months    Recent Outpatient Visits           2 months ago Encounter for [employer] annual  wellness exam   [facility] Leavy Mole, PA-C   3 months ago [facility]   [facility] Leavy Mole, PA-C   4 months ago Type 2 diabetes mellitus with other circulatory complication, without long-term current use of insulin [facility])   [facility] Leavy Mole, PA-C

## 2023-11-02 ENCOUNTER — Ambulatory Visit: Admitting: Family Medicine

## 2023-11-03 ENCOUNTER — Emergency Department
Admission: EM | Admit: 2023-11-03 | Discharge: 2023-11-03 | Disposition: A | Discharge status: Home / Self Care | Attending: Emergency Medicine | Admitting: Emergency Medicine

## 2023-11-03 ENCOUNTER — Other Ambulatory Visit: Payer: Self-pay

## 2023-11-03 DIAGNOSIS — R202 Paresthesia of skin: Secondary | ICD-10-CM | POA: Insufficient documentation

## 2023-11-03 DIAGNOSIS — M79641 Pain in right hand: Secondary | ICD-10-CM | POA: Insufficient documentation

## 2023-11-03 DIAGNOSIS — M79642 Pain in left hand: Secondary | ICD-10-CM | POA: Insufficient documentation

## 2023-11-03 MED ORDER — KETOROLAC TROMETHAMINE 30 MG/ML IJ SOLN
30.0000 mg | Freq: Once | INTRAMUSCULAR | Status: AC
  Administered 2023-11-03: 30 mg via INTRAMUSCULAR
  Filled 2023-11-03: qty 1

## 2023-11-03 MED ORDER — TRAMADOL HCL 50 MG PO TABS: 100.0000 mg | ORAL_TABLET | Freq: Four times a day (QID) | ORAL | 0 refills | Status: DC | PRN

## 2023-11-03 NOTE — ED Triage Notes (Signed)
 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 Denies injury.

## 2023-11-03 NOTE — Discharge Instructions (Signed)
 Please take over-the-counter medication as needed for pain control according to the label instructions. Take Tramadol  as prescribed for severe pain. Do not drink alcohol, drive or participate in any other potentially dangerous activities while taking this medication as it may make you sleepy. Do not take this medication with any other sedating medications, either prescription or over-the-counter. If you were prescribed Percocet or Vicodin, do not take these with acetaminophen  (Tylenol ) as it is already contained within these medications.   This medication is an opiate (or narcotic) pain medication and can be habit forming.  Use it as little as possible to achieve adequate pain control.  Do not use or use it with extreme caution if you have a history of opiate abuse or dependence.  If you are on a pain contract with your primary care doctor or a pain specialist, be sure to let them know you were prescribed this medication today from the [facility] Emergency Department.  This medication is intended for your use only - do not give any to anyone else and keep it in a secure place where nobody else, especially children, have access to it.  It will also cause or worsen constipation, so you may want to consider taking an over-the-counter stool softener while you are taking this medication.  Follow-up with your regular provider at the next available opportunity.  Return to the emergency department if you develop new or worsening symptoms that concern you.

## 2023-11-03 NOTE — ED Provider Notes (Signed)
 [facility] Provider Note    Event Date/Time   First MD Initiated Contact with Patient 11/03/23 0413     (approximate)   History   Hand Pain   HPI Paul Ingram is a 68 y.o. male who reports a history of arthritis in his hands and said that he is presenting tonight because the pain is worse than usual.  He does not think he takes anything regularly for it.  He said most of the time is okay but his hands have been aching a lot recently and tonight it was bad enough that he could not sleep so he drove himself here.  He said that it feels better now and made the comment you know how when you come to the doctor then you feel better?  He said is still uncomfortable but better than before.  He said he has some chronic numbness and tingling in his left hand after a stroke years ago but that is unchanged.  No weakness.  Maybe a little bit of swelling but not a substantial amount and the discomfort is in both hands, slightly worse on the left.  No recent injury.     Physical Exam   Triage Vital Signs: ED Triage Vitals  Encounter Vitals Group     BP 11/03/23 0408 (!) 136/93     Girls Systolic BP Percentile --      Girls Diastolic BP Percentile --      Boys Systolic BP Percentile --      Boys Diastolic BP Percentile --      Pulse Rate 11/03/23 0408 (!) 103     Resp 11/03/23 0408 18     Temp 11/03/23 0408 98.1 F (36.7 C)     Temp Source 11/03/23 0408 Oral     SpO2 11/03/23 0408 97 %     Weight 11/03/23 0408 97.5 kg (215 lb)     Height 11/03/23 0408 1.727 m (5' 8)     Head Circumference --      Peak Flow --      Pain Score 11/03/23 0407 10     Pain Loc --      Pain Education --      Exclude from Growth Chart --     Most recent vital signs: Vitals:   11/03/23 0408  BP: (!) 136/93  Pulse: (!) 103  Resp: 18  Temp: 98.1 F (36.7 C)  SpO2: 97%    General: Awake, no obvious distress.  Jovial and conversant. CV:  Good peripheral perfusion.  Initially  borderline tachycardic, improved after he was sitting in the exam room for a few minutes. Resp:  Normal effort. Speaking easily and comfortably, no accessory muscle usage nor intercostal retractions.   Abd:  No distention.  Other:  No appreciable swelling to the patient's hands.  He has no point tenderness to palpation along any of the digits or joints of his wrists, snuffbox, metacarpals, nor digits.  He is able to fully flex and extend his fingers and squeeze my hands without any apparent difficulty.  Normal and equal grip and major muscle groups strength of bilateral upper extremities.  No evidence of edema or erythema or specific joint causing issues that would suggest gout or an infectious process.   ED Results / Procedures / Treatments   Labs (all labs ordered are listed, but only abnormal results are displayed) Labs Reviewed - No data to display   PROCEDURES:  Critical Care performed: No  Procedures    IMPRESSION / MDM / ASSESSMENT AND PLAN / ED COURSE  I reviewed the triage vital signs and the nursing notes.                              Differential diagnosis includes, but is not limited to, arthritis pain, gout, autoimmune process, Raynaud's, Sjogren syndrome, infection, fracture or dislocation.  Patient's presentation is most consistent with acute, uncomplicated illness.  Interventions/Medications given:  Medications  ketorolac  (TORADOL ) 30 MG/ML injection 30 mg (has no administration in time range)    (Note:  hospital course my include additional interventions and/or labs/studies not listed above.)   Reassuring physical exam and vitals after the initial tachycardia improved.  No indication of an emergent condition requiring intervention or additional imaging.  Patient drove himself so I cannot provide opioids.  I explained this and offered Toradol  30 mg intramuscular which she agrees with.  I will give him a short course of tramadol  and encourage close outpatient  follow-up with [facility] who is his PCP.  He agrees with the plan  The patient's medical screening exam is reassuring with no indication of an emergent medical condition requiring hospitalization or additional evaluation at this point.  The patient is safe and appropriate for discharge and outpatient follow up.         FINAL CLINICAL IMPRESSION(S) / ED DIAGNOSES   Final diagnoses:  Bilateral hand pain     Rx / DC Orders   ED Discharge Orders          Ordered    traMADol  (ULTRAM ) 50 MG tablet  Every 6 hours PRN        11/03/23 0527             Note:  This document was prepared using Dragon voice recognition software and may include unintentional dictation errors.   Gordan Huxley, MD 11/03/23 [phone]

## 2023-11-14 ENCOUNTER — Telehealth: Payer: Self-pay

## 2023-11-14 NOTE — Telephone Encounter (Signed)
 Received refill reorder form from [employer] on [employer], faxed provider portion today can be fax to [employer] or fax it back [phone].

## 2023-11-22 NOTE — Telephone Encounter (Signed)
 Received provider potion of PAP [employer] ,faxed to [employer] today.

## 2023-12-25 ENCOUNTER — Other Ambulatory Visit: Payer: Self-pay

## 2023-12-25 ENCOUNTER — Telehealth: Payer: Self-pay | Admitting: Family Medicine

## 2023-12-25 DIAGNOSIS — E1159 Type 2 diabetes mellitus with other circulatory complications: Secondary | ICD-10-CM

## 2023-12-25 DIAGNOSIS — I10 Essential (primary) hypertension: Secondary | ICD-10-CM

## 2023-12-25 MED ORDER — AMLODIPINE BESYLATE-VALSARTAN 10-160 MG PO TABS: 1.0000 | ORAL_TABLET | Freq: Every day | ORAL | 0 refills | Status: DC

## 2023-12-25 NOTE — Telephone Encounter (Signed)
 Pt is completely out of amLODipine -valsartan  (EXFORGE ) 10-160 MG tablet. Please send to [email or web address]. He has appt sch'd for this Friday with Julie

## 2023-12-28 ENCOUNTER — Other Ambulatory Visit: Payer: Self-pay

## 2023-12-28 ENCOUNTER — Encounter: Payer: Self-pay | Admitting: Nurse Practitioner

## 2023-12-28 ENCOUNTER — Ambulatory Visit (INDEPENDENT_AMBULATORY_CARE_PROVIDER_SITE_OTHER): Admitting: Nurse Practitioner

## 2023-12-28 VITALS — BP 134/82 | HR 99 | Temp 98.0°F | Resp 16 | Ht 68.0 in | Wt 213.2 lb

## 2023-12-28 DIAGNOSIS — Z8673 Personal history of transient ischemic attack (TIA), and cerebral infarction without residual deficits: Secondary | ICD-10-CM | POA: Diagnosis not present

## 2023-12-28 DIAGNOSIS — Z23 Encounter for immunization: Secondary | ICD-10-CM | POA: Diagnosis not present

## 2023-12-28 DIAGNOSIS — R7989 Other specified abnormal findings of blood chemistry: Secondary | ICD-10-CM

## 2023-12-28 DIAGNOSIS — K219 Gastro-esophageal reflux disease without esophagitis: Secondary | ICD-10-CM

## 2023-12-28 DIAGNOSIS — R399 Unspecified symptoms and signs involving the genitourinary system: Secondary | ICD-10-CM

## 2023-12-28 DIAGNOSIS — G4733 Obstructive sleep apnea (adult) (pediatric): Secondary | ICD-10-CM | POA: Diagnosis not present

## 2023-12-28 DIAGNOSIS — E782 Mixed hyperlipidemia: Secondary | ICD-10-CM | POA: Diagnosis not present

## 2023-12-28 DIAGNOSIS — I10 Essential (primary) hypertension: Secondary | ICD-10-CM

## 2023-12-28 DIAGNOSIS — E1169 Type 2 diabetes mellitus with other specified complication: Secondary | ICD-10-CM | POA: Diagnosis not present

## 2023-12-28 DIAGNOSIS — R42 Dizziness and giddiness: Secondary | ICD-10-CM | POA: Diagnosis not present

## 2023-12-28 DIAGNOSIS — I714 Abdominal aortic aneurysm, without rupture, unspecified: Secondary | ICD-10-CM

## 2023-12-28 DIAGNOSIS — E1159 Type 2 diabetes mellitus with other circulatory complications: Secondary | ICD-10-CM

## 2023-12-28 DIAGNOSIS — F1020 Alcohol dependence, uncomplicated: Secondary | ICD-10-CM

## 2023-12-28 MED ORDER — AMLODIPINE BESYLATE-VALSARTAN 10-160 MG PO TABS: 1.0000 | ORAL_TABLET | Freq: Every day | ORAL | 1 refills | Status: DC

## 2023-12-28 MED ORDER — PANTOPRAZOLE SODIUM 40 MG PO TBEC: 40.0000 mg | DELAYED_RELEASE_TABLET | Freq: Every day | ORAL | 1 refills | Status: AC

## 2023-12-28 MED ORDER — MECLIZINE HCL 25 MG PO TABS: 12.5000 mg | ORAL_TABLET | Freq: Three times a day (TID) | ORAL | 1 refills | Status: AC | PRN

## 2023-12-28 MED ORDER — ATORVASTATIN CALCIUM 40 MG PO TABS: 40.0000 mg | ORAL_TABLET | Freq: Every day | ORAL | 1 refills | Status: AC

## 2023-12-28 NOTE — Progress Notes (Signed)
 BP 134/82 (Cuff Size: Large)   Pulse 99   Temp 98 F (36.7 C) (Oral)   Resp 16   Ht 5' 8 (1.727 m)   Wt 213 lb 3.2 oz (96.7 kg)   BMI 32.42 kg/m    Subjective:    Patient ID: Paul Ingram, male    DOB: [birth year], 68 y.o.   MRN: [id number]  HPI: Paul Ingram is a 68 y.o. male  Chief Complaint  Patient presents with   Medical Management of Chronic Issues   Discussed the use of AI scribe software for clinical note transcription with the patient, who gave verbal consent to proceed.  History of Present Illness Paul Ingram is a 68 year old male who presents for follow-up and medication refill.  Medication access and adherence - Currently out of amlodipine -valsartan , atorvastatin , and meclizine  due to receiving only one bottle instead of two - Taking Jardiance  as prescribed and has some Rybelsus  at home - [facility] [place]  Hypertension - Primary hypertension managed with amlodipine -valsartan  10-160 mg daily - blood pressure at 134/82, denies any chest pain or shortness of breath  Dyslipidemia and type 2 diabetes mellitus - Dyslipidemia associated with type 2 diabetes mellitus - Last A1c was 6.7% - Last LDL was 68 mg/dL - Not taking atorvastatin  40 mg daily; last filled December of previous year  Gastroesophageal reflux disease (gerd) - GERD managed with pantoprazole  40 mg daily  Dizziness - Experiences dizzy spells, managed with meclizine  - Currently out of meclizine   Cerebrovascular disease - History of cerebrovascular accident (CVA)  Lower urinary tract symptoms -reports doing well, not currently having symptoms  Alcohol use and liver function - Consumes two to three beers daily - Last AST elevated at 80 U/L - No abdominal pain  Abdominal aortic aneurysm surveillance - Abdominal aortic aneurysm identified on ultrasound - Recommended surveillance every three years         12/28/2023    8:10 AM 07/12/2023   11:38  AM 07/12/2023   11:29 AM  Depression screen PHQ 2/9  Decreased Interest 0 0 0  Down, Depressed, Hopeless 0 0 0  PHQ - 2 Score 0 0 0  Altered sleeping  0 0  Tired, decreased energy  0 0  Change in appetite  0 0  Feeling bad or failure about yourself   0 0  Trouble concentrating  0 0  Moving slowly or fidgety/restless  0 0  Suicidal thoughts  0 0  PHQ-9 Score  0 0  Difficult doing work/chores  Not difficult at all Not difficult at all    Relevant past medical, surgical, family and social history reviewed and updated as indicated. Interim medical history since our last visit reviewed. Allergies and medications reviewed and updated.  Review of Systems  Constitutional: Negative for fever or weight change.  Respiratory: Negative for cough and shortness of breath.   Cardiovascular: Negative for chest pain or palpitations.  Gastrointestinal: Negative for abdominal pain, no bowel changes.  Musculoskeletal: Negative for gait problem or joint swelling.  Skin: Negative for rash.  Neurological: Negative for dizziness or headache.  No other specific complaints in a complete review of systems (except as listed in HPI above).      Objective:      BP 134/82 (Cuff Size: Large)   Pulse 99   Temp 98 F (36.7 C) (Oral)   Resp 16   Ht 5' 8 (1.727 m)   Wt 213  lb 3.2 oz (96.7 kg)   BMI 32.42 kg/m    Wt Readings from Last 3 Encounters:  12/28/23 213 lb 3.2 oz (96.7 kg)  11/03/23 215 lb (97.5 kg)  08/01/23 216 lb (98 kg)    Physical Exam VITALS: BP- 134/82 GENERAL: Alert, cooperative, well developed, no acute distress. HEENT: Normocephalic, normal oropharynx, moist mucous membranes. CHEST: Clear to auscultation bilaterally, no wheezes, rhonchi, or crackles. CARDIOVASCULAR: Normal heart rate and rhythm, S1 and S2 normal without murmurs. ABDOMEN: Soft, non-tender, non-distended, without organomegaly, normal bowel sounds. EXTREMITIES: No cyanosis or edema. NEUROLOGICAL: Cranial nerves  grossly intact, moves all extremities without gross motor or sensory deficit.  Results for orders placed or performed in visit on 08/08/23  Lab report - scanned   Collection Time: 07/24/23 11:54 AM  Result Value Ref Range   Microalb Creat Ratio 30.0           Assessment & Plan:   Problem List Items Addressed This Visit       Cardiovascular and Mediastinum   Primary hypertension - Primary   Relevant Medications   amLODipine -valsartan  (EXFORGE ) 10-160 MG tablet   atorvastatin  (LIPITOR) 40 MG tablet   Other Relevant Orders   Comprehensive metabolic panel with GFR   Abdominal aortic aneurysm (AAA) without rupture   Relevant Medications   amLODipine -valsartan  (EXFORGE ) 10-160 MG tablet   atorvastatin  (LIPITOR) 40 MG tablet     Respiratory   OSA (obstructive sleep apnea)     Digestive   Gastroesophageal reflux disease   Relevant Medications   pantoprazole  (PROTONIX ) 40 MG tablet   meclizine  (ANTIVERT ) 25 MG tablet     Endocrine   Dyslipidemia associated with type 2 diabetes mellitus (HCC)   Relevant Medications   amLODipine -valsartan  (EXFORGE ) 10-160 MG tablet   atorvastatin  (LIPITOR) 40 MG tablet   Diabetes mellitus (HCC)   Relevant Medications   amLODipine -valsartan  (EXFORGE ) 10-160 MG tablet   atorvastatin  (LIPITOR) 40 MG tablet   Other Relevant Orders   Comprehensive metabolic panel with GFR   Hemoglobin A1c   Uncontrolled type 2 diabetes mellitus with hyperglycemia, without long-term current use of insulin (HCC)   Relevant Medications   amLODipine -valsartan  (EXFORGE ) 10-160 MG tablet   atorvastatin  (LIPITOR) 40 MG tablet     Other   Mixed hyperlipidemia   Relevant Medications   amLODipine -valsartan  (EXFORGE ) 10-160 MG tablet   atorvastatin  (LIPITOR) 40 MG tablet   History of CVA in adulthood   Elevated LFTs   Relevant Orders   Comprehensive metabolic panel with GFR   Alcoholism (HCC)   Lower urinary tract symptoms (LUTS)   Other Visit Diagnoses        Hypertension goal BP (blood pressure) < 140/90       Relevant Medications   amLODipine -valsartan  (EXFORGE ) 10-160 MG tablet   atorvastatin  (LIPITOR) 40 MG tablet     Vertigo       Relevant Medications   meclizine  (ANTIVERT ) 25 MG tablet        Assessment and Plan Assessment & Plan Primary hypertension Blood pressure is well-controlled at 134/82 mmHg on current therapy. - Refill amlodipine -valsartan  10-160 mg daily.  Type 2 diabetes mellitus with dyslipidemia Type 2 diabetes is managed with Jardiance  and Rybelsus . Dyslipidemia is associated with diabetes. Last A1c was 6.7%. LDL was 68 mg/dL. Atorvastatin  therapy was not continued as prescribed. - Refill atorvastatin  40 mg daily. - Ensure atorvastatin  is sent to the correct pharmacy. - Continue Jardiance  25 mg daily. - Continue Rybelsus  7 mg daily.  Gastroesophageal reflux disease (GERD) GERD is managed with pantoprazole . No current symptoms reported. - Refill pantoprazole  40 mg daily.  Abdominal aortic aneurysm, stable, under surveillance Abdominal aortic aneurysm was last evaluated on September 06, 2023. Recommended re-evaluation every three years.  Alcohol use disorder Continues to consume 2-3 beers daily. Previous liver enzyme tests showed elevated AST at 80 U/L. - Recheck liver enzymes. - Consider ultrasound of liver if liver enzymes remain elevated.  Elevated liver enzymes Previous liver enzyme tests showed elevated AST at 80 U/L. No abdominal pain reported. - Recheck liver enzymes. - Consider ultrasound of liver if liver enzymes remain elevated.  Dizziness Dizziness managed with meclizine . No current symptoms reported. - Refill meclizine .  General Health Maintenance Flu vaccination is due. - Administer flu shot.        Follow up plan: Return in about 4 months (around 04/29/2024) for follow up.

## 2023-12-29 LAB — COMPREHENSIVE METABOLIC PANEL WITH GFR
AG Ratio: 1.3 (calc) (ref 1.0–2.5)
ALT: 25 U/L (ref 9–46)
AST: 29 U/L (ref 10–35)
Albumin: 3.9 g/dL (ref 3.6–5.1)
Alkaline phosphatase (APISO): 52 U/L (ref 35–144)
BUN/Creatinine Ratio: 10 (calc) (ref 6–22)
BUN: 7 mg/dL (ref 7–25)
CO2: 27 mmol/L (ref 20–32)
Calcium: 9.1 mg/dL (ref 8.6–10.3)
Chloride: 102 mmol/L (ref 98–110)
Creat: 0.69 mg/dL — ABNORMAL LOW (ref 0.70–1.35)
Globulin: 3.1 g/dL (ref 1.9–3.7)
Glucose, Bld: 110 mg/dL — ABNORMAL HIGH (ref 65–99)
Potassium: 3.4 mmol/L — ABNORMAL LOW (ref 3.5–5.3)
Sodium: 139 mmol/L (ref 135–146)
Total Bilirubin: 2.4 mg/dL — ABNORMAL HIGH (ref 0.2–1.2)
Total Protein: 7 g/dL (ref 6.1–8.1)
eGFR: 100 mL/min/1.73m2 (ref >=60)

## 2023-12-29 LAB — HEMOGLOBIN A1C
Hgb A1c MFr Bld: 6.3 % — ABNORMAL HIGH (ref <5.7)
Mean Plasma Glucose: 134 mg/dL
eAG (mmol/L): 7.4 mmol/L

## 2023-12-31 ENCOUNTER — Ambulatory Visit: Payer: Self-pay | Admitting: Nurse Practitioner

## 2024-01-10 ENCOUNTER — Telehealth: Payer: Self-pay

## 2024-01-10 NOTE — Telephone Encounter (Signed)
   01/10/2024 Name: Paul Ingram MRN: [id number] DOB: May 12, 1955  Patient enrolled in [employer]  patient assistance program from [employer] through 03/26/2024   [employer] has announced that they will no longer offer [employer]  to [employer] beneficiaries through their Patient Assistance Program in 2026.    Attempted to outreach to patient today and provide this update. Left voicemail requesting a call back at earliest convenience.   Will also share this information with patient via MyChart message  Gessica Jawad E. Marsh, PharmD Clinical Pharmacist [facility] [phone]

## 2024-01-15 DIAGNOSIS — R7303 Prediabetes: Secondary | ICD-10-CM | POA: Diagnosis not present

## 2024-01-15 DIAGNOSIS — M542 Cervicalgia: Secondary | ICD-10-CM | POA: Diagnosis not present

## 2024-02-04 ENCOUNTER — Emergency Department

## 2024-02-04 ENCOUNTER — Emergency Department
Admission: EM | Admit: 2024-02-04 | Discharge: 2024-02-04 | Disposition: A | Discharge status: Home / Self Care | Attending: Emergency Medicine | Admitting: Emergency Medicine

## 2024-02-04 ENCOUNTER — Other Ambulatory Visit: Payer: Self-pay

## 2024-02-04 DIAGNOSIS — E119 Type 2 diabetes mellitus without complications: Secondary | ICD-10-CM | POA: Diagnosis not present

## 2024-02-04 DIAGNOSIS — I10 Essential (primary) hypertension: Secondary | ICD-10-CM | POA: Diagnosis not present

## 2024-02-04 DIAGNOSIS — R519 Headache, unspecified: Secondary | ICD-10-CM

## 2024-02-04 DIAGNOSIS — R202 Paresthesia of skin: Secondary | ICD-10-CM | POA: Insufficient documentation

## 2024-02-04 DIAGNOSIS — M542 Cervicalgia: Secondary | ICD-10-CM | POA: Diagnosis present

## 2024-02-04 DIAGNOSIS — R9089 Other abnormal findings on diagnostic imaging of central nervous system: Secondary | ICD-10-CM

## 2024-02-04 MED ORDER — ACETAMINOPHEN 500 MG PO TABS: 1000.0000 mg | ORAL_TABLET | Freq: Four times a day (QID) | ORAL | 2 refills | Status: DC | PRN

## 2024-02-04 MED ORDER — LORAZEPAM 1 MG PO TABS
0.5000 mg | ORAL_TABLET | Freq: Once | ORAL | Status: AC
  Administered 2024-02-04: 0.5 mg via ORAL
  Filled 2024-02-04: qty 1

## 2024-02-04 MED ORDER — LIDOCAINE 5 % EX PTCH: 1.0000 | MEDICATED_PATCH | CUTANEOUS | 0 refills | Status: DC

## 2024-02-04 MED ORDER — LIDOCAINE 5 % EX PTCH
1.0000 | MEDICATED_PATCH | CUTANEOUS | Status: DC
  Administered 2024-02-04: 1 via TRANSDERMAL
  Filled 2024-02-04: qty 1

## 2024-02-04 MED ORDER — KETOROLAC TROMETHAMINE 15 MG/ML IJ SOLN
15.0000 mg | Freq: Once | INTRAMUSCULAR | Status: AC
  Administered 2024-02-04: 15 mg via INTRAMUSCULAR
  Filled 2024-02-04: qty 1

## 2024-02-04 MED ORDER — ACETAMINOPHEN 500 MG PO TABS: 1000.0000 mg | ORAL_TABLET | Freq: Four times a day (QID) | ORAL | 2 refills | Status: AC | PRN

## 2024-02-04 MED ORDER — ACETAMINOPHEN 500 MG PO TABS
1000.0000 mg | ORAL_TABLET | Freq: Once | ORAL | Status: AC
  Administered 2024-02-04: 1000 mg via ORAL
  Filled 2024-02-04: qty 2

## 2024-02-04 MED ORDER — METOCLOPRAMIDE HCL 10 MG PO TABS
10.0000 mg | ORAL_TABLET | Freq: Once | ORAL | Status: AC
  Administered 2024-02-04: 10 mg via ORAL
  Filled 2024-02-04: qty 1

## 2024-02-04 NOTE — ED Provider Notes (Signed)
 [facility] Provider Note   Event Date/Time   First MD Initiated Contact with Patient 02/04/24 1357     (approximate) History  No chief complaint on file.  HPI Paul Ingram is a 68 y.o. male with a past medical history of hypertension, hyperlipidemia, type 2 diabetes, previous CVA, previous C6/C7 fusion, and stable cerebral glioma who presents complaining of worsening right neck pain.  Patient was seen for right posterior neck pain at [facility] clinic on 10/21 and was given NSAIDs and baclofen which patient has stated is not helping his pain.  Patient was also seen by neurosurgery 1 year prior to arrival who stated that the cervical MRI at that time was nonsurgical.  Patient states that over the last few days he has started to have numbness that radiates up the right side of his neck into his scalp with associated paresthesias.  Patient states that this is somewhat related to the movements of his head however he states that it is exquisitely tender to move his head to the right. ROS: Patient currently denies any vision changes, tinnitus, difficulty speaking, facial droop, sore throat, chest pain, shortness of breath, abdominal pain, nausea/vomiting/diarrhea, dysuria, or weakness/numbness/paresthesias in any extremity   Physical Exam  Triage Vital Signs: ED Triage Vitals [02/04/24 1329]  Encounter Vitals Group     BP (!) 160/126     Girls Systolic BP Percentile      Girls Diastolic BP Percentile      Boys Systolic BP Percentile      Boys Diastolic BP Percentile      Pulse Rate 83     Resp 18     Temp 97.6 F (36.4 C)     Temp Source Oral     SpO2 98 %     Weight      Height      Head Circumference      Peak Flow      Pain Score 10     Pain Loc      Pain Education      Exclude from Growth Chart    Most recent vital signs: Vitals:   02/04/24 1329  BP: (!) 160/126  Pulse: 83  Resp: 18  Temp: 97.6 F (36.4 C)  SpO2: 98%   General: Awake, oriented  x4. CV:  Good peripheral perfusion. Resp:  Normal effort. Abd:  No distention. Other:  Elderly the obese African-American male resting comfortably in no acute distress ED Results / Procedures / Treatments  Labs (all labs ordered are listed, but only abnormal results are displayed) Labs Reviewed - No data to display RADIOLOGY ED MD interpretation: Pending - All radiology independently interpreted and agree with radiology assessment Official radiology report(s): No results found. PROCEDURES: Critical Care performed: No Procedures MEDICATIONS ORDERED IN ED: Medications - No data to display IMPRESSION / MDM / ASSESSMENT AND PLAN / ED COURSE  I reviewed the triage vital signs and the nursing notes.                             The patient is on the cardiac monitor to evaluate for evidence of arrhythmia and/or significant heart rate changes. Patient's presentation is most consistent with acute presentation with potential threat to life or bodily function. Patient is a 68 year old male with the above-stated past medical history that presents for worsening right neck pain with associated numbness and paresthesias up the right side of the  scalp DDx: Spinal stenosis, spinal nerve impingement, vertebral fracture, central cord syndrome Plan: MR of the brain and cervical spine Care of this patient will be signed out to the oncoming physician at the end of my shift.  All pertinent patient information conveyed and all questions answered.  All further care and disposition decisions will be made by the oncoming physician. Clinical Course as of 02/05/24 1341  Mon Feb 04, 2024  1459 S/o from Dr. Jossie: 32m hx cervical spinal dz s/p spinal fusion p/w worsening R neck pain w/ paresthesias/numbness radiating up R head/face - pain gradual over past few days, numbness on R head new onset today - If no acute surgical pathology nor evidence of stroke, anticipate discharge home  TO DO: - f/u MRI [MM]   1536 MRI brain: IMPRESSION: 1. No acute intracranial abnormality. 2. Mild chronic small vessel ischemic disease and mild cerebral atrophy. 3. 9 mm cystic lesion in the right postcentral gyrus, nonspecific though stability from 2022 favors a benign etiology such as a neuroglial cyst.   [MM]  1536 MRI Cspine: IMPRESSION: 1. No acute findings or explanation for the patient's symptoms. 2. Multilevel cervical spondylosis with resulting mild spinal stenosis and moderate foraminal narrowing from C3-4 through C7-T1, similar to previous study. 3. Stable chronic myelomalacia on the right at C6-7. No new cord abnormalities identified.   [MM]  1636 Patient reevaluated, notes he still has some persistent headache though the facial numbness has resolved.  Tells me has been ongoing for about 3 weeks, somewhat waxing and waning in nature.  Extends from right skull base up through right parietal and temporal region.  Reassured by overall stable MRI imaging.  No evidence of acute pathology at this time.  For more pain control, normal renal function on labs last month, will give single dose of IM Toradol  as well as Tylenol , Lidoderm  patch and oral Reglan for presumed migraine or tension headache.  Also consider pain secondary to impaired visualized neuro ganglion cyst.  No fever, no meningismus, no midline neck pain.  Normal neurologic exam on my repeat evaluation.  Does have point tenderness right paraspinal cervical region.  Blood pressure somewhat elevated on arrival, suspect secondary to pain.  No evidence of underlying hemorrhage.  Will recheck.  He is already established with neurosurgery, recommend he follow-up with them as well as his primary care provider.  ED return precautions placed.  Patient agrees with plan. [MM]  1702 BP(!): 134/95 Rpt bp improved [MM]  1704 Patient reevaluated, headache continues to improve  Discussed findings again with family at bedside.  Discussed likely cyst that  is stable from prior imaging in 2022.  Due again recommend neurosurgery follow-up in addition to PMD.  Presentation much more consistent with some element of MSK strain.  Rx Tylenol , Lidoderm  patches.  ED return precautions in place.  Will proceed with discharge home. [MM]    Clinical Course User Index [MM] Clarine Ozell LABOR, MD   FINAL CLINICAL IMPRESSION(S) / ED DIAGNOSES   Final diagnoses:  None   Rx / DC Orders   ED Discharge Orders     None      Note:  This document was prepared using Dragon voice recognition software and may include unintentional dictation errors.   Joseline Mccampbell K, MD 02/05/24 [phone]

## 2024-02-04 NOTE — ED Triage Notes (Signed)
 Pt to ED via POV from home. Pt reports pain to right posterior side of head that has been ongoing for over a week. Pt seen at [facility] on 10/[facility] for it.. Pt denies injuries, numbness, tingling, vision changes. Pt reports hx of arthritis. Hx of cervical spondylosis and is suppose to be scheduling with neurosurgery.

## 2024-02-04 NOTE — ED Provider Notes (Signed)
  Physical Exam  BP (!) 134/95   Pulse 83   Temp 97.6 F (36.4 C) (Oral)   Resp 18   SpO2 98%   Physical Exam  Procedures  Procedures  ED Course / MDM   Clinical Course as of 02/04/24 1706  Mon Feb 04, 2024  1459 S/o from Dr. Jossie: 56m hx cervical spinal dz s/p spinal fusion p/w worsening R neck pain w/ paresthesias/numbness radiating up R head/face - pain gradual over past few days, numbness on R head new onset today - If no acute surgical pathology nor evidence of stroke, anticipate discharge home  TO DO: - f/u MRI [MM]  1536 MRI brain: IMPRESSION: 1. No acute intracranial abnormality. 2. Mild chronic small vessel ischemic disease and mild cerebral atrophy. 3. 9 mm cystic lesion in the right postcentral gyrus, nonspecific though stability from 2022 favors a benign etiology such as a neuroglial cyst.   [MM]  1536 MRI Cspine: IMPRESSION: 1. No acute findings or explanation for the patient's symptoms. 2. Multilevel cervical spondylosis with resulting mild spinal stenosis and moderate foraminal narrowing from C3-4 through C7-T1, similar to previous study. 3. Stable chronic myelomalacia on the right at C6-7. No new cord abnormalities identified.   [MM]  1636 Patient reevaluated, notes he still has some persistent headache though the facial numbness has resolved.  Tells me has been ongoing for about 3 weeks, somewhat waxing and waning in nature.  Extends from right skull base up through right parietal and temporal region.  Reassured by overall stable MRI imaging.  No evidence of acute pathology at this time.  For more pain control, normal renal function on labs last month, will give single dose of IM Toradol  as well as Tylenol , Lidoderm  patch and oral Reglan for presumed migraine or tension headache.  Also consider pain secondary to impaired visualized neuro ganglion cyst.  No fever, no meningismus, no midline neck pain.  Normal neurologic exam on my repeat evaluation.   Does have point tenderness right paraspinal cervical region.  Blood pressure somewhat elevated on arrival, suspect secondary to pain.  No evidence of underlying hemorrhage.  Will recheck.  He is already established with neurosurgery, recommend he follow-up with them as well as his primary care provider.  ED return precautions placed.  Patient agrees with plan. [MM]  1702 BP(!): 134/95 Rpt bp improved [MM]  1704 Patient reevaluated, headache continues to improve  Discussed findings again with family at bedside.  Discussed likely cyst that is stable from prior imaging in 2022.  Due again recommend neurosurgery follow-up in addition to PMD.  Presentation much more consistent with some element of MSK strain.  Rx Tylenol , Lidoderm  patches.  ED return precautions in place.  Will proceed with discharge home. [MM]    Clinical Course User Index [MM] Clarine Ozell LABOR, MD   Medical Decision Making Amount and/or Complexity of Data Reviewed Radiology: ordered.  Risk OTC drugs. Prescription drug management.          Clarine Ozell LABOR, MD 02/04/24 [phone]

## 2024-02-04 NOTE — Discharge Instructions (Addendum)
 Your evaluation in the emergency department was overall reassuring, and an MRI of your brain and neck showed no new or concerning findings.  As discussed, your MRI of your brain did show a possible right-sided cyst, but this appears stable from 3 years ago and I recommend you continue to follow-up with your neurosurgery team for ongoing evaluation as well as your primary care provider.  You can use Tylenol  and Lidoderm  patches as needed for any ongoing discomfort.  Return to the emergency department with any new or worsening symptoms.
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
## 2024-02-12 ENCOUNTER — Ambulatory Visit: Admitting: Orthopedic Surgery

## 2024-02-12 ENCOUNTER — Ambulatory Visit (INDEPENDENT_AMBULATORY_CARE_PROVIDER_SITE_OTHER)

## 2024-02-12 ENCOUNTER — Encounter: Payer: Self-pay | Admitting: Orthopedic Surgery

## 2024-02-12 VITALS — BP 128/84 | Ht 68.0 in | Wt 211.4 lb

## 2024-02-12 DIAGNOSIS — M47812 Spondylosis without myelopathy or radiculopathy, cervical region: Secondary | ICD-10-CM

## 2024-02-12 DIAGNOSIS — M4802 Spinal stenosis, cervical region: Secondary | ICD-10-CM

## 2024-02-12 NOTE — Patient Instructions (Signed)
 It was so nice to see you today. Thank you so much for coming in.    You have arthritis (wear and tear) in your neck and this is likely causing your neck pain.   I ordered xrays of your neck to get done today. We will call you with results.   I want you to see physical medicine and rehab at the [facility] to discuss possible injections in your neck. Dr. Avanell, Dr. Dodson, and their NP [place] are great and will take good care of you. They should call you to schedule an appointment or you can call them at [phone].   I will see you back in 8 weeks. Please do not hesitate to call if you have any questions or concerns. You can also message me in MyChart.   Glade Boys PA-C [phone]     The physicians and staff at [facility] at [facility] are committed to providing excellent care. You may receive a survey asking for feedback about your experience at our office. We value you your feedback and appreciate you taking the time to to fill it out. The [facility] leadership team is also available to discuss your experience in person, feel free to contact us  [phone].

## 2024-02-18 ENCOUNTER — Telehealth: Payer: Self-pay | Admitting: Orthopedic Surgery

## 2024-02-18 NOTE — Telephone Encounter (Signed)
 Patient notified and voiced understanding.

## 2024-02-18 NOTE — Telephone Encounter (Signed)
 Please call and let him know that his xrays show wear and tear (arthritis). He does not have any instability- this is good news. No change to plan we discussed at his visit.

## 2024-02-26 ENCOUNTER — Telehealth: Payer: Self-pay

## 2024-02-26 NOTE — Telephone Encounter (Signed)
 Received refill request from [facility] ([facility] ),filled and faxed to provider office to sign and date.

## 2024-03-03 NOTE — Telephone Encounter (Signed)
 Received provider portion [employer] )Faxed to [facility] today.

## 2024-03-11 ENCOUNTER — Other Ambulatory Visit: Payer: Self-pay | Admitting: Family Medicine

## 2024-03-11 DIAGNOSIS — I10 Essential (primary) hypertension: Secondary | ICD-10-CM

## 2024-03-11 DIAGNOSIS — E1159 Type 2 diabetes mellitus with other circulatory complications: Secondary | ICD-10-CM

## 2024-03-11 NOTE — Telephone Encounter (Unsigned)
 Copied from CRM #[id number]. Topic: Clinical - Medication Refill >> Mar 11, 2024  9:13 AM Eva FALCON wrote: Medication:  amLODipine -valsartan  (EXFORGE ) 10-160 MG tablet   Has the patient contacted their pharmacy? No, states bottle is empty. (Agent: If no, request that the patient contact the pharmacy for the refill. If patient does not wish to contact the pharmacy document the reason why and proceed with request.) (Agent: If yes, when and what did the pharmacy advise?)  This is the patient's preferred pharmacy:  [id number] - [place], [place] - [address] [place] [address] Phone: [phone] Fax: [phone]  Is this the correct pharmacy for this prescription? Yes If no, delete pharmacy and type the correct one.   Has the prescription been filled recently? Yes, back in October.  Is the patient out of the medication? Yes  Has the patient been seen for an appointment in the last year OR does the patient have an upcoming appointment? Yes  Can we respond through MyChart? Yes  Agent: Please be advised that Rx refills may take up to 3 business days. We ask that you follow-up with your pharmacy.

## 2024-03-13 NOTE — Telephone Encounter (Signed)
 Requested medication (s) are due for refill today: Yes  Requested medication (s) are on the active medication list: Yes  Last refill:  12/28/23  Future visit scheduled: No  Notes to clinic:  Unable to refill per protocol, last refill by another provider.      Requested Prescriptions  Pending Prescriptions Disp Refills   amLODipine -valsartan  (EXFORGE ) 10-160 MG tablet 90 tablet 1    Sig: Take 1 tablet by mouth daily.     Cardiovascular: CCB + ARB Combos Failed - 03/13/2024  6:09 PM      Failed - K in normal range and within 180 days    Potassium  Date Value Ref Range Status  12/28/2023 3.4 (L) 3.5 - 5.3 mmol/L Final         Failed - Cr in normal range and within 180 days    Creat  Date Value Ref Range Status  12/28/2023 0.69 (L) 0.70 - 1.35 mg/dL Final   Creatinine, Urine  Date Value Ref Range Status  03/05/2023 62 20 - 320 mg/dL Final         Passed - Na in normal range and within 180 days    Sodium  Date Value Ref Range Status  12/28/2023 139 135 - 146 mmol/L Final         Passed - Patient is not pregnant      Passed - Last BP in normal range    BP Readings from Last 1 Encounters:  02/12/24 128/84         Passed - Valid encounter within last 6 months    Recent Outpatient Visits           2 months ago Primary hypertension   [facility] Gareth Mliss FALCON, FNP   7 months ago Encounter for [employer] annual wellness exam   [facility] Leavy Mole, PA-C   8 months ago Vertigo   [facility] Leavy Mole, PA-C   9 months ago Type 2 diabetes mellitus with other circulatory complication, without long-term current use of insulin [facility])   [facility] Leavy Mole, PA-C

## 2024-03-14 MED ORDER — AMLODIPINE BESYLATE-VALSARTAN 10-160 MG PO TABS: 1.0000 | ORAL_TABLET | Freq: Every day | ORAL | 0 refills | Status: AC

## 2024-03-14 NOTE — Telephone Encounter (Signed)
 Needs f/u in feb for follow-up
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
## 2024-04-14 ENCOUNTER — Other Ambulatory Visit: Payer: Self-pay

## 2024-04-14 ENCOUNTER — Telehealth: Payer: Self-pay

## 2024-04-14 ENCOUNTER — Emergency Department

## 2024-04-14 ENCOUNTER — Emergency Department
Admission: EM | Admit: 2024-04-14 | Discharge: 2024-04-14 | Disposition: A | Discharge status: Home / Self Care | Source: Ambulatory Visit

## 2024-04-14 DIAGNOSIS — E119 Type 2 diabetes mellitus without complications: Secondary | ICD-10-CM | POA: Insufficient documentation

## 2024-04-14 DIAGNOSIS — U071 COVID-19: Secondary | ICD-10-CM | POA: Diagnosis not present

## 2024-04-14 DIAGNOSIS — J069 Acute upper respiratory infection, unspecified: Secondary | ICD-10-CM | POA: Insufficient documentation

## 2024-04-14 DIAGNOSIS — R112 Nausea with vomiting, unspecified: Secondary | ICD-10-CM | POA: Diagnosis present

## 2024-04-14 DIAGNOSIS — R000 Tachycardia, unspecified: Secondary | ICD-10-CM | POA: Diagnosis not present

## 2024-04-14 LAB — LACTIC ACID, PLASMA: Lactic Acid, Venous: 1.5 mmol/L (ref 0.5–1.9)

## 2024-04-14 LAB — COMPREHENSIVE METABOLIC PANEL WITH GFR
ALT: 35 U/L (ref 0–44)
AST: 96 U/L — ABNORMAL HIGH (ref 15–41)
Albumin: 4 g/dL (ref 3.5–5.0)
Alkaline Phosphatase: 59 U/L (ref 38–126)
Anion gap: 14 (ref 5–15)
BUN: 5 mg/dL — ABNORMAL LOW (ref 8–23)
CO2: 23 mmol/L (ref 22–32)
Calcium: 8.5 mg/dL — ABNORMAL LOW (ref 8.9–10.3)
Chloride: 101 mmol/L (ref 98–111)
Creatinine, Ser: 0.77 mg/dL (ref 0.61–1.24)
GFR, Estimated: >60 mL/min
Glucose, Bld: 113 mg/dL — ABNORMAL HIGH (ref 70–99)
Potassium: 3.4 mmol/L — ABNORMAL LOW (ref 3.5–5.1)
Sodium: 138 mmol/L (ref 135–145)
Total Bilirubin: 2.3 mg/dL — ABNORMAL HIGH (ref 0.0–1.2)
Total Protein: 6.9 g/dL (ref 6.5–8.1)

## 2024-04-14 LAB — CBC
HCT: 42.3 % (ref 39.0–52.0)
Hemoglobin: 14.8 g/dL (ref 13.0–17.0)
MCH: 32.5 pg (ref 26.0–34.0)
MCHC: 35 g/dL (ref 30.0–36.0)
MCV: 93 fL (ref 80.0–100.0)
Platelets: 193 K/uL (ref 150–400)
RBC: 4.55 MIL/uL (ref 4.22–5.81)
RDW: 14.2 % (ref 11.5–15.5)
WBC: 7.8 K/uL (ref 4.0–10.5)
nRBC: 0 % (ref 0.0–0.2)

## 2024-04-14 LAB — RESP PANEL BY RT-PCR (RSV, FLU A&B, COVID)  RVPGX2
Influenza A by PCR: NEGATIVE
Influenza B by PCR: NEGATIVE
Resp Syncytial Virus by PCR: NEGATIVE
SARS Coronavirus 2 by RT PCR: POSITIVE — AB

## 2024-04-14 LAB — LIPASE, BLOOD: Lipase: 38 U/L (ref 11–51)

## 2024-04-14 MED ORDER — ACETAMINOPHEN 500 MG PO TABS
1000.0000 mg | ORAL_TABLET | Freq: Once | ORAL | Status: AC
  Administered 2024-04-14: 1000 mg via ORAL
  Filled 2024-04-14: qty 2

## 2024-04-14 MED ORDER — SODIUM CHLORIDE 0.9 % IV BOLUS
1000.0000 mL | Freq: Once | INTRAVENOUS | Status: AC
  Administered 2024-04-14: 1000 mL via INTRAVENOUS

## 2024-04-14 MED ORDER — ACETAMINOPHEN 500 MG PO TABS: 1000.0000 mg | ORAL_TABLET | Freq: Four times a day (QID) | ORAL | 2 refills | Status: AC | PRN

## 2024-04-14 MED ORDER — ONDANSETRON 4 MG PO TBDP: 4.0000 mg | ORAL_TABLET | Freq: Three times a day (TID) | ORAL | 0 refills | Status: AC | PRN

## 2024-04-14 MED ORDER — ONDANSETRON HCL 4 MG/2ML IJ SOLN
4.0000 mg | Freq: Once | INTRAMUSCULAR | Status: AC
  Administered 2024-04-14: 4 mg via INTRAVENOUS
  Filled 2024-04-14: qty 2

## 2024-04-14 NOTE — ED Provider Notes (Addendum)
 "  [facility] Provider Note    Event Date/Time   First MD Initiated Contact with Patient 04/14/24 1001     (approximate)   History   Emesis  Pt comes with vomiting since Saturday. Pt states bad cough and nausea. Pt states headache also.   HPI Paul Ingram is a 69 y.o. male PMH DM2, prior stroke, abdominal aortic aneurysm, hyperlipidemia, hypertension, prior GI bleed, alcohol use disorder presents for evaluation of vomiting, cough, nausea, headache - 3 days of symptoms.  Has had notable cough, some chills but no obvious fever.  Has been vomiting over the past couple days with very minimal p.o. intake.  Denies any abdominal pain.  Last bowel movement yesterday, unremarkable.  Mild intermittent headache.  No neck pain/stiffness. - Family bedside notes they had COVID about a week and a half ago       Physical Exam   Triage Vital Signs: ED Triage Vitals  Encounter Vitals Group     BP 04/14/24 0959 (!) 131/91     Girls Systolic BP Percentile --      Girls Diastolic BP Percentile --      Boys Systolic BP Percentile --      Boys Diastolic BP Percentile --      Pulse Rate 04/14/24 0959 (!) 130     Resp 04/14/24 0959 18     Temp 04/14/24 0959 98.7 F (37.1 C)     Temp src --      SpO2 04/14/24 0959 100 %     Weight 04/14/24 0959 204 lb (92.5 kg)     Height 04/14/24 0959 5' 8 (1.727 m)     Head Circumference --      Peak Flow --      Pain Score 04/14/24 0958 0     Pain Loc --      Pain Education --      Exclude from Growth Chart --     Most recent vital signs: Vitals:   04/14/24 0959 04/14/24 1023  BP: (!) 131/91 (!) 137/97  Pulse: (!) 130 (!) 120  Resp: 18 18  Temp: 98.7 F (37.1 C) (!) 100.4 F (38 C)  SpO2: 100% 98%   Feels warm to touch.  Oral temperature 100.8F on my eval.  General: Awake, no distress.  CV:  Good peripheral perfusion.  Tachycardic, regular rhythm, RP 2+ Resp:  Normal effort. CTAB, ?  Somewhat diminished bilateral  bases Abd:  No distention. Nontender to deep palpation throughout    ED Results / Procedures / Treatments   Labs (all labs ordered are listed, but only abnormal results are displayed) Labs Reviewed  RESP PANEL BY RT-PCR (RSV, FLU A&B, COVID)  [id number] - Abnormal; Notable for the following components:      Result Value   SARS Coronavirus 2 by RT PCR POSITIVE (*)    All other components within normal limits  COMPREHENSIVE METABOLIC PANEL WITH GFR - Abnormal; Notable for the following components:   Potassium 3.4 (*)    Glucose, Bld 113 (*)    BUN 5 (*)    Calcium  8.5 (*)    AST 96 (*)    Total Bilirubin 2.3 (*)    All other components within normal limits  CULTURE, BLOOD (ROUTINE X 2)  CULTURE, BLOOD (ROUTINE X 2)  CBC  LACTIC ACID, PLASMA  LIPASE, BLOOD     EKG  Ecg = sinus tachycardia, rate 132, no gross ST elevation or depression, no  significant repolarization abnormality, left axis deviation, normal intervals.  No clear evidence of ischemia nor arrhythmia on my interpretation.   RADIOLOGY Radiology interpreted by myself radiology report reviewed.  No acute pathology identified.    PROCEDURES:  Critical Care performed: No  Procedures   MEDICATIONS ORDERED IN ED: Medications  acetaminophen  (TYLENOL ) tablet 1,000 mg (1,000 mg Oral Given 04/14/24 1051)  sodium chloride  0.9 % bolus 1,000 mL (1,000 mLs Intravenous New Bag/Given 04/14/24 1054)  ondansetron  (ZOFRAN ) injection 4 mg (4 mg Intravenous Given 04/14/24 1054)     IMPRESSION / MDM / ASSESSMENT AND PLAN / ED COURSE  I reviewed the triage vital signs and the nursing notes.                              DDX/MDM/AP: Differential diagnosis includes, but is not limited to, viral syndrome including COVID-19 or influenza, consider pneumonia.  Do not suspect acute intra-abdominal pathology at this time given very benign abdominal exam with no complaints of pain after I can reassess as needed.  Do suspect some element  of dehydration and possible electrolyte abnormalities.  Regarding tachycardia, suspect combination of fever and dehydration driving.  Plan: - Labs - EKG - Chest x-ray - Tylenol , fluid, Zofran  - Reassess  Patient's presentation is most consistent with acute presentation with potential threat to life or bodily function.  The patient is on the cardiac monitor to evaluate for evidence of arrhythmia and/or significant heart rate changes.  ED course below.  Workup positive for COVID-19, otherwise unremarkable workup.  Did have known COVID exposure recently.  I do suspect this is etiology of presentation today with URI symptoms and nausea/vomiting.  Feeling much better after IV fluid and Zofran  and Tylenol , tolerating p.o. without difficulty.  Serial abdominal exams benign.  Has received COVID shot, satting well on room air with normal work of breathing.  No evidence of superimposed pneumonia on chest x-ray.  Heart rate normalized to 90s/low 100s with fever control and IV fluids.  Do not suspect other source of infection at this time with otherwise reassuring workup.  Rx Zofran , Tylenol  and plan for PMD follow-up.  Isolation precautions discussed.  ED return precautions in place.  Patient and family agree with plan.  Clinical Course as of 04/14/24 1213  Mon Apr 14, 2024  1147 COVID +  CBC unremarkable Lactate normal  Suspect fever and vomiting secondary to COVID [MM]  1153 CMP reviewed, overall unremarkable.  LFTs similar to prior, abdominal exam benign, do not suspect underlying cholecystitis or other hepatobiliary pathology at this time. [MM]  1208 Patient reevaluated, eating crackers and drinking fluids now  Heart rate improved to about 100  Will plan for discharge home with Zofran  and Tylenol .  Has received his COVID shot.  Plan for PMD follow-up.  ED return precautions in place.  Patient agrees with plan. [MM]    Clinical Course User Index [MM] Clarine Ozell LABOR, MD     FINAL CLINICAL  IMPRESSION(S) / ED DIAGNOSES   Final diagnoses:  COVID-19  Upper respiratory tract infection, unspecified type  Nausea and vomiting, unspecified vomiting type     Rx / DC Orders   ED Discharge Orders          Ordered    ondansetron  (ZOFRAN -ODT) 4 MG disintegrating tablet  Every 8 hours PRN        04/14/24 1211    acetaminophen  (TYLENOL ) 500 MG tablet  Every 6  hours PRN        04/14/24 1211             Note:  This document was prepared using Dragon voice recognition software and may include unintentional dictation errors.   Clarine Ozell LABOR, MD 04/14/24 1213    Clarine Ozell LABOR, MD 04/14/24 1213  "

## 2024-04-14 NOTE — Telephone Encounter (Signed)
-----   Message from HILMA HASTINGS M sent at 04/11/2024  4:04 PM EST ----- He has appt with me on Tuesday for f/u after ESI.   Dr. Dodson was unable to do cervical ESI on 03/13/24 and looks like she ordered cervical ESI with sedation.   I don't see this has been scheduled. Please call and see if it has been scheduled/done (not sure where she sent him).   If it's not been done, I would push back follow up with me.   Thanks!

## 2024-04-14 NOTE — ED Notes (Signed)
 Pt given DC instructions. Pt verbalized understanding of medications and follow up care. Pt ambulatory from ED without difficulty.

## 2024-04-14 NOTE — Discharge Instructions (Addendum)
 Your evaluation in the emergency department was notable for a positive COVID-19 test.  I do believe this is causing your symptoms.  I prescribed you nausea medication to use as needed as well as Tylenol  which you can use for any fever or bodyaches.  I recommend isolating at home for at least another 2-3 days and then wearing a mask in public for further 5 days assuming you have no ongoing fever.  Please do follow-up with a primary care provider for reevaluation, and return to the emergency department with any new or worsening symptoms.

## 2024-04-14 NOTE — Telephone Encounter (Signed)
 Left message for patient to call back

## 2024-04-14 NOTE — ED Triage Notes (Signed)
 Pt comes with vomiting since Saturday. Pt states bad cough and nausea. Pt states headache also.

## 2024-04-15 ENCOUNTER — Ambulatory Visit: Admitting: Orthopedic Surgery

## 2024-04-15 NOTE — Telephone Encounter (Signed)
 Spoke with patient. Patient has not gotten a call about his ESI under sedation and he also has Covid now. Appointment rescheduled.

## 2024-04-19 LAB — CULTURE, BLOOD (ROUTINE X 2)
Culture: NO GROWTH
Special Requests: ADEQUATE

## 2024-04-29 ENCOUNTER — Ambulatory Visit: Admitting: Family Medicine

## 2024-05-07 ENCOUNTER — Encounter: Admitting: Family Medicine

## 2024-05-29 ENCOUNTER — Ambulatory Visit: Admitting: Orthopedic Surgery

## 2024-06-12 ENCOUNTER — Encounter: Admitting: Nurse Practitioner
